# Patient Record
Sex: Male | Born: 1960 | Race: Black or African American | Hispanic: No | Marital: Married | Smoking: Never smoker
Health system: Southern US, Community
[De-identification: ages and names within clinical notes are randomized; demographics above are authoritative.]

## PROBLEM LIST (undated history)

## (undated) ENCOUNTER — Ambulatory Visit (HOSPITAL_COMMUNITY): Payer: BC Managed Care – PPO

## (undated) DIAGNOSIS — M1990 Unspecified osteoarthritis, unspecified site: Secondary | ICD-10-CM

## (undated) DIAGNOSIS — I10 Essential (primary) hypertension: Secondary | ICD-10-CM

## (undated) DIAGNOSIS — E785 Hyperlipidemia, unspecified: Secondary | ICD-10-CM

## (undated) DIAGNOSIS — M109 Gout, unspecified: Secondary | ICD-10-CM

## (undated) DIAGNOSIS — E119 Type 2 diabetes mellitus without complications: Secondary | ICD-10-CM

## (undated) HISTORY — DX: Essential (primary) hypertension: I10

## (undated) HISTORY — DX: Unspecified osteoarthritis, unspecified site: M19.90

---

## 2004-10-29 ENCOUNTER — Emergency Department (HOSPITAL_COMMUNITY): Admission: EM | Admit: 2004-10-29 | Discharge: 2004-10-29 | Payer: Self-pay | Admitting: *Deleted

## 2006-01-05 ENCOUNTER — Emergency Department (HOSPITAL_COMMUNITY): Admission: EM | Admit: 2006-01-05 | Discharge: 2006-01-05 | Payer: Self-pay | Admitting: Emergency Medicine

## 2006-10-16 ENCOUNTER — Emergency Department (HOSPITAL_COMMUNITY): Admission: EM | Admit: 2006-10-16 | Discharge: 2006-10-16 | Payer: Self-pay | Admitting: Emergency Medicine

## 2006-11-01 ENCOUNTER — Emergency Department (HOSPITAL_COMMUNITY): Admission: EM | Admit: 2006-11-01 | Discharge: 2006-11-01 | Payer: Self-pay | Admitting: Emergency Medicine

## 2012-03-18 ENCOUNTER — Ambulatory Visit (INDEPENDENT_AMBULATORY_CARE_PROVIDER_SITE_OTHER): Payer: BC Managed Care – PPO | Admitting: Family Medicine

## 2012-03-18 ENCOUNTER — Ambulatory Visit: Payer: BC Managed Care – PPO

## 2012-03-18 VITALS — BP 148/87 | HR 55 | Temp 98.2°F | Resp 20 | Ht 70.0 in | Wt 219.0 lb

## 2012-03-18 DIAGNOSIS — E782 Mixed hyperlipidemia: Secondary | ICD-10-CM

## 2012-03-18 DIAGNOSIS — R200 Anesthesia of skin: Secondary | ICD-10-CM

## 2012-03-18 DIAGNOSIS — I10 Essential (primary) hypertension: Secondary | ICD-10-CM

## 2012-03-18 DIAGNOSIS — Z0000 Encounter for general adult medical examination without abnormal findings: Secondary | ICD-10-CM

## 2012-03-18 DIAGNOSIS — N529 Male erectile dysfunction, unspecified: Secondary | ICD-10-CM

## 2012-03-18 DIAGNOSIS — R209 Unspecified disturbances of skin sensation: Secondary | ICD-10-CM

## 2012-03-18 DIAGNOSIS — Z23 Encounter for immunization: Secondary | ICD-10-CM

## 2012-03-18 DIAGNOSIS — E785 Hyperlipidemia, unspecified: Secondary | ICD-10-CM

## 2012-03-18 DIAGNOSIS — M109 Gout, unspecified: Secondary | ICD-10-CM

## 2012-03-18 LAB — URIC ACID: Uric Acid, Serum: 7.7 mg/dL (ref 4.0–7.8)

## 2012-03-18 LAB — COMPREHENSIVE METABOLIC PANEL
ALT: 21 U/L (ref 0–53)
Albumin: 4.7 g/dL (ref 3.5–5.2)
CO2: 27 mEq/L (ref 19–32)
Chloride: 103 mEq/L (ref 96–112)
Glucose, Bld: 98 mg/dL (ref 70–99)
Potassium: 4.5 mEq/L (ref 3.5–5.3)
Sodium: 139 mEq/L (ref 135–145)
Total Bilirubin: 0.7 mg/dL (ref 0.3–1.2)
Total Protein: 7.3 g/dL (ref 6.0–8.3)

## 2012-03-18 LAB — GLUCOSE, POCT (MANUAL RESULT ENTRY): POC Glucose: 94

## 2012-03-18 LAB — TESTOSTERONE: Testosterone: 343.37 ng/dL (ref 300–890)

## 2012-03-18 LAB — PSA: PSA: 2.49 ng/mL (ref <=4.00)

## 2012-03-18 LAB — LIPID PANEL
Cholesterol: 225 mg/dL — ABNORMAL HIGH (ref 0–200)
Total CHOL/HDL Ratio: 3.9 Ratio

## 2012-03-18 LAB — TSH: TSH: 1.666 u[IU]/mL (ref 0.350–4.500)

## 2012-03-18 MED ORDER — TELMISARTAN-HCTZ 80-12.5 MG PO TABS: 1.0000 | ORAL_TABLET | Freq: Every day | ORAL | Status: DC

## 2012-03-18 MED ORDER — SIMVASTATIN 80 MG PO TABS: 80.0000 mg | ORAL_TABLET | Freq: Every day | ORAL | Status: DC

## 2012-03-18 MED ORDER — TADALAFIL 10 MG PO TABS: 10.0000 mg | ORAL_TABLET | ORAL | Status: DC | PRN

## 2012-03-18 MED ORDER — ALLOPURINOL 100 MG PO TABS: 200.0000 mg | ORAL_TABLET | Freq: Every day | ORAL | Status: DC

## 2012-03-18 NOTE — Patient Instructions (Signed)
Check your blood pressures outside of office and recheck in the next 1 month.

## 2012-03-18 NOTE — Progress Notes (Signed)
  Subjective:    Patient ID: Guy Horton, male    DOB: [birth year], 51 y.o.   MRN: [id number]  HPI Guy Horton is a 51 y.o. male Here for CPE. Hx of  HTN, hyperlipidemia, and gout.  HTN - outside BP's - "normal" but unknown numbers. - possible 132/80? Marland Kitchen  No chest pain, no DOE, no lightheadedness or dizziness. No new side effects with meds. No recent missed doses.  Hyperlipidemia - On simvastatin 80mg  qd. No new myalgias.  Last lipid panel 8/12 - LDL 107  Gout - no new side effects of meds.  On 200mg  of allopurinol each day.  Last gout flair 1-2 years ago. Uric acid 9.2 on 08/06/11, but had rn out of meds a that time.  No missed doses recently.  Numbness - R big toe - x 3 weeks.  NKI.  No known hx DM.  No back, leg or ankle pain.  Occasional erectile dysfunction - past 3-4 months., tried Cialis in past - no side effects. Tried otc herbal treatments.   Fasting - last po 11pm last night.  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  Review of Systems See 13point ROS on scanned PHS form.    Objective:   Physical Exam  Constitutional: He is oriented to person, place, and time. He appears well-developed and well-nourished.  HENT:  Head: Normocephalic and atraumatic.  Right Ear: External ear normal.  Left Ear: External ear normal.  Mouth/Throat: Oropharynx is clear and moist.  Eyes: Conjunctivae and EOM are normal. Pupils are equal, round, and reactive to light.  Neck: Normal range of motion. Neck supple. No thyromegaly present.  Cardiovascular: Normal rate, regular rhythm, normal heart sounds and intact distal pulses.   Pulmonary/Chest: Effort normal and breath sounds normal. No respiratory distress. He has no wheezes.  Abdominal: Soft. He exhibits no distension. There is no tenderness. Hernia confirmed negative in the right inguinal area and confirmed negative in the left inguinal area.  Genitourinary: Prostate normal.  Musculoskeletal: Normal range of motion. He exhibits no edema  and no tenderness.  Lymphadenopathy:    He has no cervical adenopathy.  Neurological: He is alert and oriented to person, place, and time. He has normal reflexes.  Skin: Skin is warm and dry.  Psychiatric: He has a normal mood and affect. His behavior is normal.    Results for orders placed in visit on 03/18/12  GLUCOSE, POCT (MANUAL RESULT ENTRY)      Component Value Range   POC Glucose 94           Assessment & Plan:  Guy Horton is a 51 y.o. male CPE- Check PSA, cmp, lipid panel, tsh  And poct glucose as above.(parasthesias in foot), Tdap updated.  HTN - borderline.  Stop otc herbal supplements, keep record of bp's outside of office and recheck in 1 month.  Cont same meds for now, refilled x 6 months.  Gout -stable.  Cont allopurinol 200mg  qd, check uric acid.  Hyperlipidemia  - cont simvastatin 80mg  qd for now, but check CMP, lipids.   ED - check testosterone level, PSA, Rx Cialis 10mg , q 36hr prn - #3, SED, and ER CP precautions reviewed.  Parasthesias.- R great toe.  Possible shoe impingement vs peripheral neuropathy.  Check lytes and TSH, recheck in 1 months  Recheck above in next 1 month, sooner if worse.

## 2012-03-25 ENCOUNTER — Encounter: Payer: Self-pay | Admitting: *Deleted

## 2012-04-08 ENCOUNTER — Telehealth: Payer: Self-pay

## 2012-04-08 MED ORDER — ROSUVASTATIN CALCIUM 10 MG PO TABS: 10.0000 mg | ORAL_TABLET | Freq: Every day | ORAL | Status: DC

## 2012-04-08 NOTE — Telephone Encounter (Signed)
Pt CB after receiving unable to reach letter about labs. See notes under lab results for new Rx that I am sending in for pt written by Dr Neva Seat. Pt agrees to f/up in 1 month.

## 2012-06-06 ENCOUNTER — Telehealth: Payer: Self-pay

## 2012-06-06 NOTE — Telephone Encounter (Signed)
The patient would like Dr. Neva Seat to return call to him at [phone], to discuss issues regarding his visit on 03/18/12.

## 2012-06-07 NOTE — Telephone Encounter (Signed)
Spoke with patient-- wanted to discuss his medications with Dr. Neva Seat.  He states the Cialis doesn't seem to be working too well, and is too expensive--he would like to try a cheaper med if possible.  Also, he states the Crestor causes nausea for him at bedtime, even though he takes his dose in the am.  He would like to change this med to a different drug as well.  Pls advise.

## 2012-06-13 MED ORDER — SIMVASTATIN 80 MG PO TABS: 80.0000 mg | ORAL_TABLET | Freq: Every day | ORAL | Status: DC

## 2012-06-13 NOTE — Telephone Encounter (Signed)
Spoke w/pt and he agreed that he would like to change back to simvastatin 80 QD. Sent in Rx for this to pharmacy. Pt agreed to call his pharmacy and check on prices of Viagra and Levitra and call us back to Rx if they are cheaper.

## 2012-06-13 NOTE — Telephone Encounter (Signed)
Pt CB and stated that pharmacy told him that they can't just tell him how much each would cost him. We have to actually send in a Rx first. Pt requests that we send in Rx for Levitra to try and he will see if he can afford it.

## 2012-06-13 NOTE — Telephone Encounter (Signed)
Call pt - he can change back to the simvastatin 80mg  qd if he tolerated that better, but should follow up to discuss in more detail if not improved.  Due for repeat OV based on last visit.  He can check with his pharmacist to see if Viagra or Levitra is less expensive.  If these are less expensive, I can prescribe those instead.

## 2012-06-16 ENCOUNTER — Other Ambulatory Visit: Payer: Self-pay | Admitting: Family Medicine

## 2012-06-16 MED ORDER — VARDENAFIL HCL 10 MG PO TABS: ORAL_TABLET | ORAL | Status: DC

## 2012-06-16 NOTE — Telephone Encounter (Signed)
I sent in rx for Levitra

## 2012-06-16 NOTE — Telephone Encounter (Signed)
LMOM notifying patient rx sent in. 

## 2012-08-04 ENCOUNTER — Telehealth: Payer: Self-pay

## 2012-08-04 NOTE — Telephone Encounter (Signed)
Pt came into 102 to ask for a RF on Cialis 5 mg #30. He has a voucher for a free RF, but he need to Rx. Pt was due for recheck per OV notes on 03/18/12, but is unable to stay today. He states he has talked w/Dr Neva Seat since his OV and gave him BP readings and that it was fine. He is really not sure that he needs a f/up visit now. Advised pt that I will check w/Dr Neva Seat to see if he can RF w/o OV.

## 2012-08-04 NOTE — Telephone Encounter (Signed)
He is due for an office visit to recheck cholesterol, electrolytes, and blood pressure. We can call in # 30 of Cialis 5mg   1 po every 2 days as needed, no further refills if he schedules appointment to see me in next few weeks or can be seen at 102 if needed in that time period.

## 2012-08-05 ENCOUNTER — Other Ambulatory Visit: Payer: Self-pay

## 2012-08-05 MED ORDER — TADALAFIL 10 MG PO TABS: 10.0000 mg | ORAL_TABLET | ORAL | Status: DC | PRN

## 2012-08-05 NOTE — Telephone Encounter (Signed)
Notified pt that RF has been sent to pharm and gave him Dr Paralee Cancel message about f/up needed. Pt agreed.

## 2012-08-13 ENCOUNTER — Ambulatory Visit (INDEPENDENT_AMBULATORY_CARE_PROVIDER_SITE_OTHER): Payer: BC Managed Care – PPO | Admitting: Physician Assistant

## 2012-08-13 VITALS — BP 133/95 | HR 82 | Temp 98.0°F | Resp 18 | Ht 70.0 in | Wt 225.0 lb

## 2012-08-13 DIAGNOSIS — M109 Gout, unspecified: Secondary | ICD-10-CM | POA: Insufficient documentation

## 2012-08-13 DIAGNOSIS — N529 Male erectile dysfunction, unspecified: Secondary | ICD-10-CM | POA: Insufficient documentation

## 2012-08-13 DIAGNOSIS — M25561 Pain in right knee: Secondary | ICD-10-CM

## 2012-08-13 DIAGNOSIS — E7800 Pure hypercholesterolemia, unspecified: Secondary | ICD-10-CM | POA: Insufficient documentation

## 2012-08-13 DIAGNOSIS — M25569 Pain in unspecified knee: Secondary | ICD-10-CM

## 2012-08-13 DIAGNOSIS — I10 Essential (primary) hypertension: Secondary | ICD-10-CM | POA: Insufficient documentation

## 2012-08-13 MED ORDER — INDOMETHACIN 50 MG PO CAPS: 50.0000 mg | ORAL_CAPSULE | Freq: Three times a day (TID) | ORAL | Status: AC | PRN

## 2012-08-13 MED ORDER — HYDROCODONE-ACETAMINOPHEN 5-325 MG PO TABS: 1.0000 | ORAL_TABLET | Freq: Every evening | ORAL | Status: AC | PRN

## 2012-08-13 NOTE — Progress Notes (Signed)
  Subjective:    Patient ID: Guy Horton, male    DOB: 18-Jun-1961, 51 y.o.   MRN: [id number]  HPI  Pt presents to clinic with R knee pain for the last 3 days.  He has h/o gout and is on daily allopurinol. He ate shrimp and drank a beer and knows that is what started his gout flair.  He is having trouble walking due to the pain.  Feels just like all his other flairs, R medial knee.    Review of Systems  Musculoskeletal: Positive for joint swelling and gait problem.       Objective:   Physical Exam  Vitals reviewed. Constitutional: He is oriented to person, place, and time. He appears well-developed and well-nourished.  HENT:  Head: Normocephalic and atraumatic.  Right Ear: External ear normal.  Left Ear: External ear normal.  Pulmonary/Chest: Effort normal.  Musculoskeletal:       Right knee: He exhibits swelling and erythema.       R knee swollen, warm and red.  He has TTP on the R medial knee only, tenderness with light touch.  Neurological: He is alert and oriented to person, place, and time.  Skin: Skin is warm and dry.  Psychiatric: He has a normal mood and affect. His behavior is normal. Judgment and thought content normal.        Assessment & Plan:   1. Gout  indomethacin (INDOCIN) 50 MG capsule, HYDROcodone-acetaminophen (NORCO/VICODIN) 5-325 MG per tablet  2. Knee pain, right  indomethacin (INDOCIN) 50 MG capsule, HYDROcodone-acetaminophen (NORCO/VICODIN) 5-325 MG per tablet   D/w pt impotence medications - if he finds a coupon let us know so we can send in meds for him.  He plans on coming in within the next week or so to see Dr Neva Seat for his regular medical care.

## 2012-08-21 ENCOUNTER — Telehealth: Payer: Self-pay

## 2012-08-21 NOTE — Telephone Encounter (Signed)
At his visit 08/13/2012, he indicated we was going to come back in the following week for routine health maintenance.  I advise he go ahead and do that and he can discuss with the provider then which medication would be the best next choice for him.  Another option is to call his pharmacy benefit manager (on the back of his insurance card) and find out which product is preferred on the formulary.

## 2012-08-21 NOTE — Telephone Encounter (Signed)
tadalafil (CIALIS) 10 MG tablet  30 days 2.5 mg Too expensive  [facility]  [phone]

## 2012-08-21 NOTE — Telephone Encounter (Signed)
Patient indicates Cialis too expensive please advise more cost effective alternative.

## 2012-08-22 NOTE — Telephone Encounter (Signed)
I have called patient to advise ( he is angry) and does not want his physical, he states he is not having any other problems. I explained to him medical issues can cause erectile dysfunction and we can discuss these with him when he comes in for his physical, since he is due for this now. He states he is not having any medical issues and does not need a physical. He only wants another Rx for ED. I explained again we can not do this without seeing him. He sates he works two jobs and can not come in. He wants me to send this message to Dr Neva Seat so Dr Neva Seat will call him. I have advised him I will advise Dr Neva Seat of the situation.

## 2012-08-23 NOTE — Telephone Encounter (Signed)
i have reviewed the recent telephone notes and office visits. He is under treatment for chronic medical problems other than erectile dysfunction, including gout, hypertension and hyperlipidemia.   I last saw him in April and based on his elevated cholesterol, a change in statin was performed with plan to follow up in 1 month.to determine control and tolerance of new statin, which has not happened.  I sent in a prescription for Levitra in June, and then Cialis - #30 of 5 mg in late August with understanding that a follow up appt would be obtained.  I do see where he had an acute office visit recently, and plan for him to call us when he found a voucher or knew which ED medicine he wanted prescribed. If the Cialis is too expensive - did he check with his pharmacy benefits to deternine which medicine would be more cost effective, as was recommended?  Does he have an appointment scheduled with me? As stated before - he is overdue to follow up on the cholesterol medicine, and will be due for follow up on blood pressure in next month. I agree with prior messages - he needs to return for follow up.  i would be willing to prescribe # 6 of Levitra 10mg  wih NO refills if that would be more cost effective, but will not prescibe any further ED meds without an office visit.  If scheduling is a problem with his jobs, he can walk in to be seen when I am at 102, including many evening or weekend options.

## 2012-08-24 ENCOUNTER — Other Ambulatory Visit: Payer: Self-pay | Admitting: Family Medicine

## 2012-08-24 MED ORDER — VARDENAFIL HCL 10 MG PO TABS: 5.0000 mg | ORAL_TABLET | ORAL | Status: DC | PRN

## 2012-08-24 NOTE — Telephone Encounter (Signed)
Spoke with pt and explained the message from Dr Neva Seat. He would like a 30 day supply ED meds which is Cialis but he has a coupon for Levitra.

## 2012-08-24 NOTE — Telephone Encounter (Signed)
Patient had called earlier to ask if rx prescription could be called in at his pharmacy and his pharmacy said it was not received today. Please call pharmacy whenever possible at [phone]

## 2012-08-24 NOTE — Telephone Encounter (Signed)
Pt called back, advised RX sent to pharmacy. Pt understood and he will come in for an ov

## 2012-08-24 NOTE — Telephone Encounter (Signed)
Done. No more refills without ov.

## 2012-08-24 NOTE — Telephone Encounter (Signed)
Spoke with pt I explained the every day ED pill is Cialis, so he would like to try levitra because he has a coupon. Could he have 9 pills or the max he can get a month? The coupon takes a percentage off so the more the better he can get off. Please advise

## 2012-08-24 NOTE — Telephone Encounter (Signed)
LMOM to CB. 

## 2012-11-05 ENCOUNTER — Other Ambulatory Visit: Payer: Self-pay | Admitting: Family Medicine

## 2013-02-06 ENCOUNTER — Other Ambulatory Visit: Payer: Self-pay | Admitting: Family Medicine

## 2013-02-06 ENCOUNTER — Telehealth: Payer: Self-pay

## 2013-02-06 NOTE — Telephone Encounter (Signed)
PT STATES HE RECEIVED A CALL STATING THEY WERE HAVING TROUBLE GETTING HIS MEDICINE FROM THE PHARMACY. WOULD LIKE TO SPEAK WITH SOMEONE ABOUT IT PLEASE CALL [phone]     [facility] [address]

## 2013-02-06 NOTE — Telephone Encounter (Signed)
Pt has questions concerning his medication.   CBN:  [phone]

## 2013-02-09 NOTE — Telephone Encounter (Signed)
Left message for him to call me back.  

## 2013-02-10 ENCOUNTER — Telehealth: Payer: Self-pay

## 2013-02-10 NOTE — Telephone Encounter (Signed)
Patient states his Micardis not covered by his insurance any longer. Please advise on alternative.

## 2013-02-10 NOTE — Telephone Encounter (Signed)
Called phone number and ID # on prior auth form sent by pharmacy for Micardis HCT and was told that pt's policy is not active. Checked w/pharmacy and they verified this is the only info they have for pt. Contacted pt and he gave me the ID # and cust service # on his ins card, which was different than what pharmacy had, and was advised that pt is not in their system either. 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 Advised him to have pharmacy try to process Rx w/current info and they can send me an updated prior auth request w/correct info if it is needed. Pt agreed.

## 2013-02-10 NOTE — Telephone Encounter (Signed)
PT HAVE QUESTIONS REGARDING HIS MEDICINE. PLEASE CALL [phone]

## 2013-02-10 NOTE — Telephone Encounter (Signed)
Do we know what will be covered by ins?  There are other meds in the same class we could try

## 2013-02-11 NOTE — Telephone Encounter (Signed)
[facility]ed [facility]. To see what else may be covered, has been sent for prior Auth. [facility] does not know what else may be covered. This medication no longer being manufactured. Apparently there is a generic now, and he did get this filled this week.

## 2013-04-11 ENCOUNTER — Other Ambulatory Visit: Payer: Self-pay | Admitting: Family Medicine

## 2013-05-02 ENCOUNTER — Other Ambulatory Visit: Payer: Self-pay | Admitting: Family Medicine

## 2013-05-14 ENCOUNTER — Ambulatory Visit (INDEPENDENT_AMBULATORY_CARE_PROVIDER_SITE_OTHER): Payer: BC Managed Care – PPO | Admitting: Emergency Medicine

## 2013-05-14 VITALS — BP 178/112 | HR 63 | Temp 98.0°F | Resp 16 | Ht 70.0 in | Wt 231.8 lb

## 2013-05-14 DIAGNOSIS — I10 Essential (primary) hypertension: Secondary | ICD-10-CM

## 2013-05-14 DIAGNOSIS — E782 Mixed hyperlipidemia: Secondary | ICD-10-CM

## 2013-05-14 DIAGNOSIS — E7800 Pure hypercholesterolemia, unspecified: Secondary | ICD-10-CM

## 2013-05-14 LAB — POCT CBC
Granulocyte percent: 63.1 %G (ref 37–80)
HCT, POC: 46 % (ref 43.5–53.7)
MCH, POC: 31.3 pg — AB (ref 27–31.2)
MCHC: 31.7 g/dL — AB (ref 31.8–35.4)
MID (cbc): 0.6 (ref 0–0.9)
RDW, POC: 13 %
WBC: 7 10*3/uL (ref 4.6–10.2)

## 2013-05-14 LAB — LIPID PANEL
HDL: 43 mg/dL (ref >39)
LDL Cholesterol: 128 mg/dL — ABNORMAL HIGH (ref 0–99)
Total CHOL/HDL Ratio: 4.6 Ratio
Triglycerides: 136 mg/dL (ref <150)
VLDL: 27 mg/dL (ref 0–40)

## 2013-05-14 LAB — COMPREHENSIVE METABOLIC PANEL
Alkaline Phosphatase: 49 U/L (ref 39–117)
BUN: 16 mg/dL (ref 6–23)
Creat: 1.04 mg/dL (ref 0.50–1.35)
Glucose, Bld: 105 mg/dL — ABNORMAL HIGH (ref 70–99)
Sodium: 140 mEq/L (ref 135–145)
Total Bilirubin: 0.5 mg/dL (ref 0.3–1.2)
Total Protein: 7.2 g/dL (ref 6.0–8.3)

## 2013-05-14 MED ORDER — ROSUVASTATIN CALCIUM 20 MG PO TABS: 20.0000 mg | ORAL_TABLET | Freq: Every day | ORAL | Status: DC

## 2013-05-14 MED ORDER — VARDENAFIL HCL 20 MG PO TABS: 20.0000 mg | ORAL_TABLET | ORAL | Status: DC | PRN

## 2013-05-14 MED ORDER — TELMISARTAN-HCTZ 80-12.5 MG PO TABS: 1.0000 | ORAL_TABLET | Freq: Every day | ORAL | Status: DC

## 2013-05-14 NOTE — Progress Notes (Signed)
[facility] [address] [place] [address] [phone]  Date:  05/14/2013   Name:  Guy Horton   DOB:  02-Aug-1961   MRN:  [id number]  PCP:  Abbe Amsterdam, MD    Chief Complaint: Medication Refill   History of Present Illness:  Guy Horton is a 52 y.o. very pleasant male patient who presents with the following:  History of hypertension and hyperlipidemia.  Out of medication for three months.  Not having symptoms.  No edema, chest pain, shortness of breath or wheezing.  No cough.  No improvement with over the counter medications or other home remedies. Denies other complaint or health concern today.   Patient Active Problem List   Diagnosis Date Noted  . Gout 08/13/2012  . HTN (hypertension) 08/13/2012  . Erectile dysfunction 08/13/2012  . Hypercholesteremia 08/13/2012    No past medical history on file.  No past surgical history on file.  History  Substance Use Topics  . Smoking status: Never Smoker   . Smokeless tobacco: Not on file  . Alcohol Use: Not on file    No family history on file.  No Known Allergies  Medication list has been reviewed and updated.  Current Outpatient Prescriptions on File Prior to Visit  Medication Sig Dispense Refill  . allopurinol (ZYLOPRIM) 100 MG tablet TAKE 2 TABLETS EVERY DAY FOR GOUT  60 tablet  3  . simvastatin (ZOCOR) 80 MG tablet Take 1 tablet (80 mg total) by mouth at bedtime. Needs office visit  30 tablet  0  . tadalafil (CIALIS) 10 MG tablet Take 1 tablet (10 mg total) by mouth every other day as needed for erectile dysfunction.  30 tablet  0  . telmisartan-hydrochlorothiazide (MICARDIS HCT) 80-12.5 MG per tablet Take 1 tablet by mouth daily. Needs office visit  30 tablet  0   No current facility-administered medications on file prior to visit.    Review of Systems:  As per HPI, otherwise negative.    Physical Examination: Filed Vitals:   05/14/13 0954  BP: 178/112  Pulse: 63  Temp:  98 F (36.7 C)  Resp: 16   Filed Vitals:   05/14/13 0954  Height: 5\' 10"  (1.778 m)  Weight: 231 lb 12.8 oz (105.144 kg)   Body mass index is 33.26 kg/(m^2). Ideal Body Weight: Weight in (lb) to have BMI = 25: 173.9  GEN: WDWN, NAD, Non-toxic, A & O x 3 HEENT: Atraumatic, Normocephalic. Neck supple. No masses, No LAD. Ears and Nose: No external deformity. CV: RRR, No M/G/R. No JVD. No thrill. No extra heart sounds. PULM: CTA B, no wheezes, crackles, rhonchi. No retractions. No resp. distress. No accessory muscle use. ABD: S, NT, ND, +BS. No rebound. No HSM. EXTR: No c/c/e NEURO Normal gait.  PSYCH: Normally interactive. Conversant. Not depressed or anxious appearing.  Calm demeanor.    Assessment and Plan: Hypertension  Hyperlipidemia Refill meds and follow up in one-three months   Signed,  Phillips Odor, MD

## 2013-05-15 ENCOUNTER — Encounter: Payer: Self-pay | Admitting: *Deleted

## 2013-08-29 ENCOUNTER — Other Ambulatory Visit: Payer: Self-pay | Admitting: Physician Assistant

## 2014-01-12 ENCOUNTER — Other Ambulatory Visit: Payer: Self-pay | Admitting: Physician Assistant

## 2014-01-13 NOTE — Telephone Encounter (Signed)
Pt needs ov for further refills.  ?

## 2014-01-22 ENCOUNTER — Other Ambulatory Visit: Payer: Self-pay | Admitting: Emergency Medicine

## 2014-03-27 ENCOUNTER — Other Ambulatory Visit: Payer: Self-pay | Admitting: Physician Assistant

## 2014-11-20 ENCOUNTER — Ambulatory Visit (INDEPENDENT_AMBULATORY_CARE_PROVIDER_SITE_OTHER): Payer: BC Managed Care – PPO | Admitting: Family Medicine

## 2014-11-20 VITALS — BP 188/112 | HR 78 | Temp 98.5°F | Resp 16 | Ht 70.0 in | Wt 221.0 lb

## 2014-11-20 DIAGNOSIS — Z9114 Patient's other noncompliance with medication regimen: Secondary | ICD-10-CM | POA: Insufficient documentation

## 2014-11-20 DIAGNOSIS — I10 Essential (primary) hypertension: Secondary | ICD-10-CM

## 2014-11-20 MED ORDER — TELMISARTAN-HCTZ 80-12.5 MG PO TABS: 1.0000 | ORAL_TABLET | Freq: Every day | ORAL | Status: DC

## 2014-11-20 NOTE — Progress Notes (Signed)
[facility] [address], [place] [address] [phone]  Date:  11/20/2014   Name:  Guy Horton   DOB:  28-Dec-1960   MRN:  [id number]  PCP:  Lamar Blinks, MD    Chief Complaint: Medication Refill   History of Present Illness:  Guy Horton is a 53 y.o. very pleasant male patient who presents with the following:  He has been off of his BP medication for about 45 days.  He was last here about 18 months ago.  When he was on his medication he thinks his BP was about 150/97.   He was on micardis HCT.  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   He did have a HA one day, but OW has not noted any sx of HTN.  This responded to a [employer] powder.  OW he is feeling well and not having any sx in particular  BP Readings from Last 3 Encounters:  11/20/14 188/112  05/14/13 178/112  08/13/12 133/95     Patient Active Problem List   Diagnosis Date Noted  . Gout 08/13/2012  . HTN (hypertension) 08/13/2012  . Erectile dysfunction 08/13/2012  . Hypercholesteremia 08/13/2012    Past Medical History  Diagnosis Date  . Hypertension     No past surgical history on file.  History  Substance Use Topics  . Smoking status: Never Smoker   . Smokeless tobacco: Not on file  . Alcohol Use: Not on file    Family History  Problem Relation Age of Onset  . Diabetes Mother     No Known Allergies  Medication list has been reviewed and updated.  Current Outpatient Prescriptions on File Prior to Visit  Medication Sig Dispense Refill  . allopurinol (ZYLOPRIM) 100 MG tablet Take 2 tablets BID NEED VISIT/LABS 60 tablet 0  . rosuvastatin (CRESTOR) 20 MG tablet Take 1 tablet (20 mg total) by mouth daily. 30 tablet 3  . simvastatin (ZOCOR) 80 MG tablet One tablet daily NEED VISIT/LABS 30 tablet 0  . telmisartan-hydrochlorothiazide (MICARDIS HCT) 80-12.5 MG per tablet Take 1 tablet by mouth daily. Needs office visit 30 tablet 0  . telmisartan-hydrochlorothiazide  (MICARDIS HCT) 80-12.5 MG per tablet Take 1 tablet by mouth daily. PATIENT NEEDS OFFICE VISIT FOR ADDITIONAL REFILLS 30 tablet 0  . vardenafil (LEVITRA) 20 MG tablet Take 1 tablet (20 mg total) by mouth as needed for erectile dysfunction. 12 tablet 12  . tadalafil (CIALIS) 10 MG tablet Take 1 tablet (10 mg total) by mouth every other day as needed for erectile dysfunction. 30 tablet 0   No current facility-administered medications on file prior to visit.    Review of Systems:  As per HPI- otherwise negative.   Physical Examination: Filed Vitals:   11/20/14 1551  BP: 188/112  Pulse: 78  Temp: 98.5 F (36.9 C)  Resp: 16   Filed Vitals:   11/20/14 1551  Height: 5\' 10"  (1.778 m)  Weight: 221 lb (100.245 kg)   Body mass index is 31.71 kg/(m^2). Ideal Body Weight: Weight in (lb) to have BMI = 25: 173.9  GEN: WDWN, NAD, Non-toxic, A & O x 3 HEENT: Atraumatic, Normocephalic. Neck supple. No masses, No LAD. Ears and Nose: No external deformity. CV: RRR, No M/G/R. No JVD. No thrill. No extra heart sounds. PULM: CTA B, no wheezes, crackles, rhonchi. No retractions. No resp. distress. No accessory muscle use.Marland Kitchen EXTR: No c/c/e NEURO Normal gait.  PSYCH: Normally interactive. Conversant.  Not depressed or anxious appearing.  Calm demeanor.    Assessment and Plan: Essential hypertension - Plan: telmisartan-hydrochlorothiazide (MICARDIS HCT) 80-12.5 MG per tablet, Basic metabolic panel  Very uncontrolled HTN.   Reminded him that he needs to do a  Better job taking care of his BP and keeping on his medications regularly.   Will follow- up with him pending his labs  Signed Lamar Blinks, MD

## 2014-11-20 NOTE — Patient Instructions (Signed)
Your blood pressure is way out of control.  Take a 1/2 tablet of your BP medication for the next 2 days and then go to a whole tablet I will be in touch with your labs. You need to stay on medication consistently in order the control your blood pressure and decrease your risk of heart attack or stroke.

## 2014-11-21 LAB — BASIC METABOLIC PANEL
BUN: 19 mg/dL (ref 6–23)
CHLORIDE: 104 meq/L (ref 96–112)
CO2: 24 meq/L (ref 19–32)
Calcium: 9.6 mg/dL (ref 8.4–10.5)
Creat: 1.1 mg/dL (ref 0.50–1.35)
Glucose, Bld: 89 mg/dL (ref 70–99)
POTASSIUM: 4.3 meq/L (ref 3.5–5.3)
SODIUM: 137 meq/L (ref 135–145)

## 2014-11-22 ENCOUNTER — Encounter: Payer: Self-pay | Admitting: Family Medicine

## 2015-01-17 ENCOUNTER — Telehealth: Payer: Self-pay

## 2015-01-17 MED ORDER — SIMVASTATIN 80 MG PO TABS: ORAL_TABLET | ORAL | Status: DC

## 2015-01-17 MED ORDER — ALLOPURINOL 100 MG PO TABS: ORAL_TABLET | ORAL | Status: DC

## 2015-01-17 NOTE — Telephone Encounter (Signed)
Please advise patient that he needs re-evaluation prior to running out of this 30-day supply.  Meds ordered this encounter  Medications  . allopurinol (ZYLOPRIM) 100 MG tablet    Sig: Take 2 tablets BID    Dispense:  120 tablet    Refill:  0  . simvastatin (ZOCOR) 80 MG tablet    Sig: One tablet daily    Dispense:  30 tablet    Refill:  0

## 2015-01-17 NOTE — Telephone Encounter (Signed)
Can we refill? 

## 2015-01-17 NOTE — Telephone Encounter (Signed)
Patient was seen on 11/20/14  For Med refills.   He needs allopurinol (ZYLOPRIM) 100 MG tablet And simvastatin (ZOCOR) 80 MG tablet   [facility] [address]   [phone]

## 2015-01-18 NOTE — Telephone Encounter (Signed)
Faxed and notified pt of RFs and need to f/up w/in next 30 days. Pt agreed.

## 2015-02-17 ENCOUNTER — Encounter (HOSPITAL_COMMUNITY): Payer: Self-pay | Admitting: Emergency Medicine

## 2015-02-17 ENCOUNTER — Emergency Department (HOSPITAL_COMMUNITY): Payer: Self-pay

## 2015-02-17 ENCOUNTER — Emergency Department (HOSPITAL_COMMUNITY)
Admission: EM | Admit: 2015-02-17 | Discharge: 2015-02-17 | Disposition: A | Discharge status: Home / Self Care | Payer: Worker's Compensation | Attending: Emergency Medicine | Admitting: Emergency Medicine

## 2015-02-17 ENCOUNTER — Telehealth: Payer: Self-pay

## 2015-02-17 DIAGNOSIS — Y9389 Activity, other specified: Secondary | ICD-10-CM | POA: Insufficient documentation

## 2015-02-17 DIAGNOSIS — S99922A Unspecified injury of left foot, initial encounter: Secondary | ICD-10-CM | POA: Insufficient documentation

## 2015-02-17 DIAGNOSIS — I10 Essential (primary) hypertension: Secondary | ICD-10-CM | POA: Insufficient documentation

## 2015-02-17 DIAGNOSIS — Y990 Civilian activity done for income or pay: Secondary | ICD-10-CM | POA: Insufficient documentation

## 2015-02-17 DIAGNOSIS — Y9289 Other specified places as the place of occurrence of the external cause: Secondary | ICD-10-CM | POA: Insufficient documentation

## 2015-02-17 DIAGNOSIS — Z79899 Other long term (current) drug therapy: Secondary | ICD-10-CM | POA: Insufficient documentation

## 2015-02-17 DIAGNOSIS — M79672 Pain in left foot: Secondary | ICD-10-CM

## 2015-02-17 DIAGNOSIS — X58XXXA Exposure to other specified factors, initial encounter: Secondary | ICD-10-CM | POA: Insufficient documentation

## 2015-02-17 MED ORDER — NAPROXEN 250 MG PO TABS: 250.0000 mg | ORAL_TABLET | Freq: Two times a day (BID) | ORAL | Status: DC

## 2015-02-17 NOTE — ED Notes (Signed)
Pt states that he injured his lt foot at work on Monday.  States a cart rolled over his foot.

## 2015-02-17 NOTE — Discharge Instructions (Signed)
Your x-ray of your left foot showed old fractures, but no new fractures.   Foot Sprain The muscles and cord like structures which attach muscle to bone (tendons) that surround the feet are made up of units. A foot sprain can occur at the weakest spot in any of these units. This condition is most often caused by injury to or overuse of the foot, as from playing contact sports, or aggravating a previous injury, or from poor conditioning, or obesity. SYMPTOMS  Pain with movement of the foot.  Tenderness and swelling at the injury site.  Loss of strength is present in moderate or severe sprains. THE THREE GRADES OR SEVERITY OF FOOT SPRAIN ARE:  Mild (Grade I): Slightly pulled muscle without tearing of muscle or tendon fibers or loss of strength.  Moderate (Grade II): Tearing of fibers in a muscle, tendon, or at the attachment to bone, with small decrease in strength.  Severe (Grade III): Rupture of the muscle-tendon-bone attachment, with separation of fibers. Severe sprain requires surgical repair. Often repeating (chronic) sprains are caused by overuse. Sudden (acute) sprains are caused by direct injury or over-use. DIAGNOSIS  Diagnosis of this condition is usually by your own observation. If problems continue, a caregiver may be required for further evaluation and treatment. X-rays may be required to make sure there are not breaks in the bones (fractures) present. Continued problems may require physical therapy for treatment. PREVENTION  Use strength and conditioning exercises appropriate for your sport.  Warm up properly prior to working out.  Use athletic shoes that are made for the sport you are participating in.  Allow adequate time for healing. Early return to activities makes repeat injury more likely, and can lead to an unstable arthritic foot that can result in prolonged disability. Mild sprains generally heal in 3 to 10 days, with moderate and severe sprains taking 2 to 10 weeks.  Your caregiver can help you determine the proper time required for healing. HOME CARE INSTRUCTIONS   Apply ice to the injury for 15-20 minutes, 03-04 times per day. Put the ice in a plastic bag and place a towel between the bag of ice and your skin.  An elastic wrap (like an Ace bandage) may be used to keep swelling down.  Keep foot above the level of the heart, or at least raised on a footstool, when swelling and pain are present.  Try to avoid use other than gentle range of motion while the foot is painful. Do not resume use until instructed by your caregiver. Then begin use gradually, not increasing use to the point of pain. If pain does develop, decrease use and continue the above measures, gradually increasing activities that do not cause discomfort, until you gradually achieve normal use.  Use crutches if and as instructed, and for the length of time instructed.  Keep injured foot and ankle wrapped between treatments.  Massage foot and ankle for comfort and to keep swelling down. Massage from the toes up towards the knee.  Only take over-the-counter or prescription medicines for pain, discomfort, or fever as directed by your caregiver. SEEK IMMEDIATE MEDICAL CARE IF:   Your pain and swelling increase, or pain is not controlled with medications.  You have loss of feeling in your foot or your foot turns cold or blue.  You develop new, unexplained symptoms, or an increase of the symptoms that brought you to your caregiver. MAKE SURE YOU:   Understand these instructions.  Will watch your condition.  Will  get help right away if you are not doing well or get worse. Document Released: 05/18/2002 Document Revised: 02/18/2012 Document Reviewed: 07/15/2008 [facility] Patient Information 2015 [place], [place]. This information is not intended to replace advice given to you by your health care provider. Make sure you discuss any questions you have with your health care provider.

## 2015-02-17 NOTE — Telephone Encounter (Signed)
Please advise, last uric acid was in 2013.
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
## 2015-02-17 NOTE — ED Notes (Signed)
Patient transported to X-ray 

## 2015-02-17 NOTE — Telephone Encounter (Signed)
Patient had a gout flare up and needs something called in. He says he cannot wait 24-48 hours, I advised him to come in if he feels he cannot wait. He refused. Advised we will do the best we can

## 2015-02-18 NOTE — Telephone Encounter (Signed)
Hi Guy Horton,  Looks like he went to the ED yest afternoon for the foot issue. They worked him up, started an NSAID, and referred to ortho. Looks like there is nothing further to do from our end for the foot.

## 2015-02-18 NOTE — Telephone Encounter (Signed)
Spoke with pt, advised message from [place]. Pt will come in to be seen by a provider to follow up on meds.

## 2015-08-12 ENCOUNTER — Ambulatory Visit (INDEPENDENT_AMBULATORY_CARE_PROVIDER_SITE_OTHER): Payer: Worker's Compensation | Admitting: Emergency Medicine

## 2015-08-12 VITALS — BP 132/90 | HR 102 | Temp 98.7°F | Resp 16 | Ht 71.0 in

## 2015-08-12 DIAGNOSIS — I10 Essential (primary) hypertension: Secondary | ICD-10-CM

## 2015-08-12 DIAGNOSIS — S81811A Laceration without foreign body, right lower leg, initial encounter: Secondary | ICD-10-CM | POA: Diagnosis not present

## 2015-08-12 DIAGNOSIS — S7011XA Contusion of right thigh, initial encounter: Secondary | ICD-10-CM

## 2015-08-12 LAB — POCT CBC
GRANULOCYTE PERCENT: 78.2 % (ref 37–80)
HCT, POC: 34.2 % — AB (ref 43.5–53.7)
Hemoglobin: 10.8 g/dL — AB (ref 14.1–18.1)
LYMPH, POC: 1.7 (ref 0.6–3.4)
MCH, POC: 29.2 pg (ref 27–31.2)
MCHC: 31.6 g/dL — AB (ref 31.8–35.4)
MCV: 92.5 fL (ref 80–97)
MID (cbc): 0.7 (ref 0–0.9)
MPV: 6.2 fL (ref 0–99.8)
PLATELET COUNT, POC: 535 10*3/uL — AB (ref 142–424)
POC Granulocyte: 8.4 — AB (ref 2–6.9)
POC LYMPH %: 15.7 % (ref 10–50)
POC MID %: 6.1 %M (ref 0–12)
RBC: 3.7 M/uL — AB (ref 4.69–6.13)
RDW, POC: 12.8 %
WBC: 10.7 10*3/uL — AB (ref 4.6–10.2)

## 2015-08-12 MED ORDER — OXYCODONE-ACETAMINOPHEN 5-325 MG PO TABS: 1.0000 | ORAL_TABLET | ORAL | Status: DC | PRN

## 2015-08-12 MED ORDER — METHOCARBAMOL 750 MG PO TABS: 750.0000 mg | ORAL_TABLET | Freq: Three times a day (TID) | ORAL | Status: DC

## 2015-08-12 MED ORDER — MORPHINE SULFATE ER 15 MG PO TBCR: 15.0000 mg | EXTENDED_RELEASE_TABLET | Freq: Two times a day (BID) | ORAL | Status: DC

## 2015-08-12 NOTE — Patient Instructions (Signed)
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
## 2015-08-13 LAB — COMPREHENSIVE METABOLIC PANEL
ALBUMIN: 3.7 g/dL (ref 3.6–5.1)
ALK PHOS: 108 U/L (ref 40–115)
ALT: 165 U/L — AB (ref 9–46)
AST: 191 U/L — AB (ref 10–35)
BILIRUBIN TOTAL: 1.9 mg/dL — AB (ref 0.2–1.2)
BUN: 15 mg/dL (ref 7–25)
CALCIUM: 9 mg/dL (ref 8.6–10.3)
CO2: 22 mmol/L (ref 20–31)
CREATININE: 1.26 mg/dL (ref 0.70–1.33)
Chloride: 101 mmol/L (ref 98–110)
GLUCOSE: 118 mg/dL — AB (ref 65–99)
Potassium: 4.3 mmol/L (ref 3.5–5.3)
Sodium: 134 mmol/L — ABNORMAL LOW (ref 135–146)
Total Protein: 7.1 g/dL (ref 6.1–8.1)

## 2015-08-21 ENCOUNTER — Other Ambulatory Visit: Payer: Self-pay | Admitting: Physician Assistant

## 2015-08-22 ENCOUNTER — Telehealth: Payer: Self-pay

## 2015-08-22 NOTE — Telephone Encounter (Signed)
Pt needs med refills on allopurinol (ZYLOPRIM) and simvastatin (ZOCOR) because he is having a gout flare up.  Please advise [phone] or [phone]

## 2015-08-24 NOTE — Telephone Encounter (Signed)
Pt also need his muscle relaxer , and robaxin and his hydorcodone

## 2015-08-25 NOTE — Telephone Encounter (Signed)
Please advise. Pt had allopurinol and simvastatin sent in 9/13 by Windell Hummingbird.  I called patient to get clarification for refills. Now requesting refill of robaxin and oxycodone.

## 2015-08-25 NOTE — Telephone Encounter (Signed)
Spoke with Dr. Ouida Sills since he saw pt and rx'd the oxy and robaxin. Dr. Ouida Sills states he would consider refilling these medications but he needs a follow up visit first. Tell pt he needs to return to follow with DR. ANDERSON. Ouida Sills is here tonight 9/15 and then again Saturday, 9/17.

## 2015-08-25 NOTE — Telephone Encounter (Signed)
Thank you, Elmyra Ricks. Pt will come see Dr. Ouida Sills on Saturday.

## 2015-08-27 ENCOUNTER — Ambulatory Visit (INDEPENDENT_AMBULATORY_CARE_PROVIDER_SITE_OTHER): Payer: Worker's Compensation | Admitting: Emergency Medicine

## 2015-08-27 DIAGNOSIS — S7011XD Contusion of right thigh, subsequent encounter: Secondary | ICD-10-CM | POA: Diagnosis not present

## 2015-08-27 DIAGNOSIS — S81811D Laceration without foreign body, right lower leg, subsequent encounter: Secondary | ICD-10-CM

## 2015-08-27 DIAGNOSIS — I10 Essential (primary) hypertension: Secondary | ICD-10-CM

## 2015-08-27 LAB — COMPREHENSIVE METABOLIC PANEL
ALK PHOS: 198 U/L — AB (ref 40–115)
ALT: 90 U/L — AB (ref 9–46)
AST: 38 U/L — ABNORMAL HIGH (ref 10–35)
Albumin: 4.4 g/dL (ref 3.6–5.1)
BUN: 25 mg/dL (ref 7–25)
CALCIUM: 10.3 mg/dL (ref 8.6–10.3)
CHLORIDE: 99 mmol/L (ref 98–110)
CO2: 27 mmol/L (ref 20–31)
Creat: 1.35 mg/dL — ABNORMAL HIGH (ref 0.70–1.33)
GLUCOSE: 143 mg/dL — AB (ref 65–99)
POTASSIUM: 5.4 mmol/L — AB (ref 3.5–5.3)
Sodium: 137 mmol/L (ref 135–146)
Total Bilirubin: 0.8 mg/dL (ref 0.2–1.2)
Total Protein: 8.3 g/dL — ABNORMAL HIGH (ref 6.1–8.1)

## 2015-08-27 LAB — CBC
HEMATOCRIT: 36.5 % — AB (ref 39.0–52.0)
Hemoglobin: 12.4 g/dL — ABNORMAL LOW (ref 13.0–17.0)
MCH: 30.9 pg (ref 26.0–34.0)
MCHC: 34 g/dL (ref 30.0–36.0)
MCV: 91 fL (ref 78.0–100.0)
MPV: 8.8 fL (ref 8.6–12.4)
PLATELETS: 642 10*3/uL — AB (ref 150–400)
RBC: 4.01 MIL/uL — ABNORMAL LOW (ref 4.22–5.81)
RDW: 12.8 % (ref 11.5–15.5)
WBC: 8.2 10*3/uL (ref 4.0–10.5)

## 2015-08-27 LAB — LIPID PANEL
CHOL/HDL RATIO: 8.1 ratio — AB (ref <=5.0)
Cholesterol: 244 mg/dL — ABNORMAL HIGH (ref 125–200)
HDL: 30 mg/dL — AB (ref >=40)
LDL Cholesterol: 165 mg/dL — ABNORMAL HIGH (ref <130)
Triglycerides: 245 mg/dL — ABNORMAL HIGH (ref <150)
VLDL: 49 mg/dL — AB (ref <30)

## 2015-08-27 MED ORDER — SIMVASTATIN 80 MG PO TABS: ORAL_TABLET | ORAL | Status: DC

## 2015-08-27 MED ORDER — ALLOPURINOL 100 MG PO TABS: ORAL_TABLET | ORAL | Status: DC

## 2015-08-27 MED ORDER — AMLODIPINE BESYLATE 10 MG PO TABS: 10.0000 mg | ORAL_TABLET | Freq: Every day | ORAL | Status: DC

## 2015-08-27 MED ORDER — TADALAFIL 10 MG PO TABS: 10.0000 mg | ORAL_TABLET | ORAL | Status: DC | PRN

## 2015-08-27 MED ORDER — OXYCODONE-ACETAMINOPHEN 5-325 MG PO TABS: 1.0000 | ORAL_TABLET | ORAL | Status: DC | PRN

## 2015-08-27 MED ORDER — TELMISARTAN-HCTZ 80-12.5 MG PO TABS: 1.0000 | ORAL_TABLET | Freq: Every day | ORAL | Status: DC
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
## 2015-08-27 NOTE — Patient Instructions (Signed)

## 2015-08-29 ENCOUNTER — Telehealth: Payer: Self-pay

## 2015-08-29 NOTE — Telephone Encounter (Signed)
PT called inq. About referal request// Informed PT that request was entered by Dr. Ouida Sills and ambulatory rehab facility would contact him for time and date of appointment//  [phone]

## 2015-09-03 ENCOUNTER — Telehealth: Payer: Self-pay

## 2015-09-03 ENCOUNTER — Other Ambulatory Visit: Payer: Self-pay | Admitting: Emergency Medicine

## 2015-09-03 NOTE — Telephone Encounter (Signed)
Patient called in to request a refill on methocarbamol (ROBAXIN-750) 750 MG tablet [[id number]]   morphine (MS CONTIN) 15 MG 12 hr tablet [[id number]]  He said the pharmacy told him that he had to call us to request the refill.

## 2015-09-04 ENCOUNTER — Encounter: Payer: Self-pay | Admitting: Family Medicine

## 2015-09-05 ENCOUNTER — Ambulatory Visit (INDEPENDENT_AMBULATORY_CARE_PROVIDER_SITE_OTHER): Payer: Worker's Compensation | Admitting: Emergency Medicine

## 2015-09-05 VITALS — BP 122/90 | HR 90 | Temp 98.7°F | Resp 18 | Ht 70.0 in | Wt 216.4 lb

## 2015-09-05 DIAGNOSIS — S50852A Superficial foreign body of left forearm, initial encounter: Secondary | ICD-10-CM

## 2015-09-05 DIAGNOSIS — S51822A Laceration with foreign body of left forearm, initial encounter: Secondary | ICD-10-CM

## 2015-09-05 DIAGNOSIS — S7011XD Contusion of right thigh, subsequent encounter: Secondary | ICD-10-CM | POA: Diagnosis not present

## 2015-09-05 DIAGNOSIS — S81811D Laceration without foreign body, right lower leg, subsequent encounter: Secondary | ICD-10-CM

## 2015-09-05 NOTE — Telephone Encounter (Signed)
Absolutely NO refills on MS contin without a visit.  I did not prescribe it

## 2015-09-05 NOTE — Telephone Encounter (Signed)
Called pt to advise. He will RTC today.

## 2015-09-05 NOTE — Patient Instructions (Addendum)
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
## 2015-09-05 NOTE — Progress Notes (Signed)
  Verbal consent obtained. Local anesthesia to posterior left forearm with 3 cc of 2% lidocaine without epinephrine. Area cleansed with betadine and prepped in sterile technique. Incision with 15 blade scalpel, approximately 1 cm in length. Wound explored and one piece of cube-shaped glass approximately 0.5 cm in length was removed. Wound was explored after foreign body removal and no glass or other foreign bodies were found. Wound closed with one 4-0 Prolene simple interrupted suture. Wound cleansed and dressed.    Margrette Wynia D. Race, PA-S Physician Assistant Student [facility]

## 2015-09-06 ENCOUNTER — Telehealth: Payer: Self-pay

## 2015-09-06 NOTE — Telephone Encounter (Signed)
Patient called to check status of refill. I told him Dr. Tonette Bihari response and he states the Oxycodone is not covering his pain. The pain was so bad it woke him around 3am, pain was 9/10. He took 2 of the oxycodone because the pain was so severe, and that relieved the pain a lot. Just taking 1 tablet is not controlling pain. He wants to know if he needs to be on a different pain medication or increase oxycodone? Please advise

## 2015-09-06 NOTE — Telephone Encounter (Signed)
Spoke with pt, I advised him that he could not get a refill on the Morphine per Dr. Tonette Bihari protocol. He states Dr. Ouida Sills thought he did not need it anymore when he was seen yesterday but states he is in a lot of pain. He needs more medication. Please advise.

## 2015-09-06 NOTE — Telephone Encounter (Signed)
He called in and was told no.  He came in yesterday and agreed he did not need them as he is on percocet.  The answer remains NO  He has adequate pain meds in the percocet

## 2015-09-06 NOTE — Telephone Encounter (Signed)
He can take two percocet.  When he is out, we can revisit his medication need IN THE OFFICE.  No more morphine

## 2015-09-06 NOTE — Telephone Encounter (Signed)
Patient notified and voiced understanding.

## 2015-09-06 NOTE — Telephone Encounter (Signed)
Pt is needing to about his pain medication with dr Ouida Sills   [phone]

## 2015-09-13 ENCOUNTER — Ambulatory Visit (INDEPENDENT_AMBULATORY_CARE_PROVIDER_SITE_OTHER): Payer: Worker's Compensation | Admitting: Emergency Medicine

## 2015-09-13 VITALS — BP 130/92 | HR 77 | Temp 98.6°F | Resp 18 | Ht 70.0 in | Wt 221.0 lb

## 2015-09-13 DIAGNOSIS — S7011XD Contusion of right thigh, subsequent encounter: Secondary | ICD-10-CM

## 2015-09-13 DIAGNOSIS — S81811D Laceration without foreign body, right lower leg, subsequent encounter: Secondary | ICD-10-CM

## 2015-09-13 DIAGNOSIS — S81819A Laceration without foreign body, unspecified lower leg, initial encounter: Secondary | ICD-10-CM | POA: Insufficient documentation

## 2015-09-13 DIAGNOSIS — S7011XA Contusion of right thigh, initial encounter: Secondary | ICD-10-CM | POA: Insufficient documentation

## 2015-09-13 MED ORDER — OXYCODONE-ACETAMINOPHEN 5-325 MG PO TABS: 1.0000 | ORAL_TABLET | ORAL | Status: DC | PRN

## 2015-09-13 MED ORDER — SILDENAFIL CITRATE 100 MG PO TABS: 100.0000 mg | ORAL_TABLET | Freq: Every day | ORAL | Status: DC | PRN

## 2015-09-13 NOTE — Patient Instructions (Signed)
Quadriceps Contusion  A quadriceps contusion is a deep bruise of the large muscle in the front of your thigh. Contusions are the result of an injury that caused bleeding under the skin. The contusion may turn blue, purple, or yellow. Minor injuries will give you a painless contusion, but more severe contusions may stay painful and swollen for a few weeks. It is necessary to follow your caregiver's directions when this muscle is bruised.  CAUSES A quadriceps contusion comes from a blow or injury to the front of the leg. SYMPTOMS   Swelling and redness of the thigh area.  Bruising of the thigh area.  Tenderness or soreness of the thigh.  Limping.  Leg stiffness.  Difficulty bending the leg.  Trouble walking. DIAGNOSIS  You will have a physical exam and will be asked about your history. You may need an X-ray of your leg. TREATMENT  Often, the best treatment for a quadriceps contusion is resting and elevating the leg and applying cold compresses to the thigh area. Over-the-counter medicines may also be recommended for pain control. You may need crutches, an elastic wrap, or a leg splint.  HOME CARE INSTRUCTIONS   Put ice on the injured area.  Put ice in a plastic bag.  Place a towel between your skin and the bag.  Leave the ice on for 15-20 minutes, 03-04 times a day.  Only take over-the-counter or prescription medicines for pain, discomfort, or fever as directed by your caregiver.  Rest the injured thigh until the pain and swelling are better.  Elevate your leg to reduce swelling. Lie down flat on your back and place a pillow under your knee.  Apply compression wraps as directed by your caregiver. You may remove it for sleeping, showers, and baths. If your toes become numb, cold, or blue, take the wrap off and reapply it more loosely.  Walk or move around as the pain allows, or as directed by your caregiver. Resume full activities only when your caregiver says it is okay.  Returning to your usual activities before your caregiver approves may cause worse damage to the muscle.  See your caregiver as directed. It is very important to keep all follow-up referrals and appointments in order to avoid any long-term problems with your leg, including chronic pain or inability to move your leg normally. SEEK MEDICAL CARE IF:   You have increased bruising or swelling.  You have pain that is getting worse.  Your swelling or pain is not relieved by medicines.  Your toes or foot become cold or turn bluish in color.  You notice your thigh getting larger in size. MAKE SURE YOU:   Understand these instructions.  Will watch your condition.  Will get help right away if you are not doing well or get worse. Document Released: 08/21/2001 Document Revised: 02/18/2012 Document Reviewed: 09/11/2011 [facility] Patient Information 2015 [place], [place]. This information is not intended to replace advice given to you by your health care provider. Make sure you discuss any questions you have with your health care provider.
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
## 2015-09-27 ENCOUNTER — Ambulatory Visit (INDEPENDENT_AMBULATORY_CARE_PROVIDER_SITE_OTHER): Payer: Worker's Compensation | Admitting: Emergency Medicine

## 2015-09-27 ENCOUNTER — Ambulatory Visit (INDEPENDENT_AMBULATORY_CARE_PROVIDER_SITE_OTHER): Payer: BLUE CROSS/BLUE SHIELD

## 2015-09-27 VITALS — BP 130/90 | HR 90 | Temp 99.1°F | Resp 16 | Ht 71.5 in | Wt 222.0 lb

## 2015-09-27 DIAGNOSIS — Z23 Encounter for immunization: Secondary | ICD-10-CM

## 2015-09-27 DIAGNOSIS — S81811D Laceration without foreign body, right lower leg, subsequent encounter: Secondary | ICD-10-CM

## 2015-09-27 DIAGNOSIS — S7011XD Contusion of right thigh, subsequent encounter: Secondary | ICD-10-CM | POA: Diagnosis not present

## 2015-09-27 NOTE — Addendum Note (Signed)
Addended by: Jerl Santos R on: 09/27/2015 09:08 AM   Modules accepted: Orders

## 2015-09-27 NOTE — Patient Instructions (Signed)
Quadriceps Contusion A quadriceps contusion is a deep bruise of the large muscle in the front of your thigh. Contusions are the result of an injury that caused bleeding under the skin. The contusion may turn blue, purple, or yellow. Minor injuries will give you a painless contusion, but more severe contusions may stay painful and swollen for a few weeks. It is necessary to follow your caregiver's directions when this muscle is bruised.  CAUSES A quadriceps contusion comes from a blow or injury to the front of the leg. SYMPTOMS   Swelling and redness of the thigh area.  Bruising of the thigh area.  Tenderness or soreness of the thigh.  Limping.  Leg stiffness.  Difficulty bending the leg.  Trouble walking. DIAGNOSIS  You will have a physical exam and will be asked about your history. You may need an X-ray of your leg. TREATMENT  Often, the best treatment for a quadriceps contusion is resting and elevating the leg and applying cold compresses to the thigh area. Over-the-counter medicines may also be recommended for pain control. You may need crutches, an elastic wrap, or a leg splint.  HOME CARE INSTRUCTIONS   Put ice on the injured area.  Put ice in a plastic bag.  Place a towel between your skin and the bag.  Leave the ice on for 15-20 minutes, 03-04 times a day.  Only take over-the-counter or prescription medicines for pain, discomfort, or fever as directed by your caregiver.  Rest the injured thigh until the pain and swelling are better.  Elevate your leg to reduce swelling. Lie down flat on your back and place a pillow under your knee.  Apply compression wraps as directed by your caregiver. You may remove it for sleeping, showers, and baths. If your toes become numb, cold, or blue, take the wrap off and reapply it more loosely.  Walk or move around as the pain allows, or as directed by your caregiver. Resume full activities only when your caregiver says it is okay.  Returning to your usual activities before your caregiver approves may cause worse damage to the muscle.  See your caregiver as directed. It is very important to keep all follow-up referrals and appointments in order to avoid any long-term problems with your leg, including chronic pain or inability to move your leg normally. SEEK MEDICAL CARE IF:   You have increased bruising or swelling.  You have pain that is getting worse.  Your swelling or pain is not relieved by medicines.  Your toes or foot become cold or turn bluish in color.  You notice your thigh getting larger in size. MAKE SURE YOU:   Understand these instructions.  Will watch your condition.  Will get help right away if you are not doing well or get worse.   This information is not intended to replace advice given to you by your health care provider. Make sure you discuss any questions you have with your health care provider.   Document Released: 08/21/2001 Document Revised: 12/17/2014 Document Reviewed: 04/13/2015 Elsevier Interactive Patient Education [employer].
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
## 2015-09-28 ENCOUNTER — Telehealth: Payer: Self-pay

## 2015-09-28 NOTE — Telephone Encounter (Signed)
Advised pt to return to clinic. Pt understood.

## 2015-09-28 NOTE — Telephone Encounter (Signed)
Pt is needing a refill on his pain meds and also a revision on his work note to say he can return on 10/02/15 not 10/03/15  Best number [phone]

## 2015-09-29 ENCOUNTER — Other Ambulatory Visit: Payer: Self-pay | Admitting: Family Medicine

## 2015-09-29 ENCOUNTER — Telehealth: Payer: Self-pay

## 2015-09-29 NOTE — Telephone Encounter (Signed)
Letter printed.

## 2015-09-29 NOTE — Telephone Encounter (Signed)
Patient has been treating with Dr Ouida Sills for a Workers' Comp injury. 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  Please advise. Patients employer is [employer] at [employer] [phone]

## 2015-09-29 NOTE — Telephone Encounter (Signed)
Spoke with Barrie Folk to inform that Dr. Ouida Sills had written a new letter stating he was fit for work w/o restrictions. He states it is ok to send letter to employer and that he doesn't need to pick it up. No further questions at this time.

## 2015-09-29 NOTE — Telephone Encounter (Signed)
Dr. Anderson, please advise.

## 2015-10-11 ENCOUNTER — Ambulatory Visit (INDEPENDENT_AMBULATORY_CARE_PROVIDER_SITE_OTHER): Payer: Worker's Compensation | Admitting: Emergency Medicine

## 2015-10-11 VITALS — BP 158/98 | HR 71 | Temp 98.5°F | Resp 16 | Ht 71.5 in | Wt 232.8 lb

## 2015-10-11 DIAGNOSIS — M79601 Pain in right arm: Secondary | ICD-10-CM

## 2015-10-11 DIAGNOSIS — S50852A Superficial foreign body of left forearm, initial encounter: Secondary | ICD-10-CM

## 2015-10-11 NOTE — Progress Notes (Signed)
Procedure Consent obtained. Area encircled. Iodine prep. 1.5 cc 1% lido local anesthesia. Sterile draping placed. Incision made and glass removed. Wound irrigated and explored. #3 5-0 ethilon simple interrupted sutures placed. Clean dressing applied. Care instructions discussed.

## 2015-10-11 NOTE — Patient Instructions (Signed)
LacerationLaceration Care, Adult A laceration is a cut that goes through all of the layers of the skin and into the tissue that is right under the skin. Some lacerations heal on their own. Others need to be closed with stitches (sutures), staples, skin adhesive strips, or skin glue. Proper laceration care minimizes the risk of infection and helps the laceration to heal better. HOW TO CARE FOR YOUR LACERATION If sutures or staples were used:  Keep the wound clean and dry.  If you were given a bandage (dressing), you should change it at least one time per day or as told by your health care provider. You should also change it if it becomes wet or dirty.  Keep the wound completely dry for the first 24 hours or as told by your health care provider. After that time, you may shower or bathe. However, make sure that the wound is not soaked in water until after the sutures or staples have been removed.  Clean the wound one time each day or as told by your health care provider:  Wash the wound with soap and water.  Rinse the wound with water to remove all soap.  Pat the wound dry with a clean towel. Do not rub the wound.  After cleaning the wound, apply a thin layer of antibiotic ointmentas told by your health care provider. This will help to prevent infection and keep the dressing from sticking to the wound.  Have the sutures or staples removed as told by your health care provider. If skin adhesive strips were used:  Keep the wound clean and dry.  If you were given a bandage (dressing), you should change it at least one time per day or as told by your health care provider. You should also change it if it becomes dirty or wet.  Do not get the skin adhesive strips wet. You may shower or bathe, but be careful to keep the wound dry.  If the wound gets wet, pat it dry with a clean towel. Do not rub the wound.  Skin adhesive strips fall off on their own. You may trim the strips as the wound heals.  Do not remove skin adhesive strips that are still stuck to the wound. They will fall off in time. If skin glue was used:  Try to keep the wound dry, but you may briefly wet it in the shower or bath. Do not soak the wound in water, such as by swimming.  After you have showered or bathed, gently pat the wound dry with a clean towel. Do not rub the wound.  Do not do any activities that will make you sweat heavily until the skin glue has fallen off on its own.  Do not apply liquid, cream, or ointment medicine to the wound while the skin glue is in place. Using those may loosen the film before the wound has healed.  If you were given a bandage (dressing), you should change it at least one time per day or as told by your health care provider. You should also change it if it becomes dirty or wet.  If a dressing is placed over the wound, be careful not to apply tape directly over the skin glue. Doing that may cause the glue to be pulled off before the wound has healed.  Do not pick at the glue. The skin glue usually remains in place for 5-10 days, then it falls off of the skin. General Instructions  Take over-the-counter and prescription  medicines only as told by your health care provider.  If you were prescribed an antibiotic medicine or ointment, take or apply it as told by your doctor. Do not stop using it even if your condition improves.  To help prevent scarring, make sure to cover your wound with sunscreen whenever you are outside after stitches are removed, after adhesive strips are removed, or when glue remains in place and the wound is healed. Make sure to wear a sunscreen of at least 30 SPF.  Do not scratch or pick at the wound.  Keep all follow-up visits as told by your health care provider. This is important.  Check your wound every day for signs of infection. Watch for:  Redness, swelling, or pain.  Fluid, blood, or pus.  Raise (elevate) the injured area above the level of your  heart while you are sitting or lying down, if possible. SEEK MEDICAL CARE IF:  You received a tetanus shot and you have swelling, severe pain, redness, or bleeding at the injection site.  You have a fever.  A wound that was closed breaks open.  You notice a bad smell coming from your wound or your dressing.  You notice something coming out of the wound, such as wood or glass.  Your pain is not controlled with medicine.  You have increased redness, swelling, or pain at the site of your wound.  You have fluid, blood, or pus coming from your wound.  You notice a change in the color of your skin near your wound.  You need to change the dressing frequently due to fluid, blood, or pus draining from the wound.  You develop a new rash.  You develop numbness around the wound. SEEK IMMEDIATE MEDICAL CARE IF:  You develop severe swelling around the wound.  Your pain suddenly increases and is severe.  You develop painful lumps near the wound or on skin that is anywhere on your body.  You have a red streak going away from your wound.  The wound is on your hand or foot and you cannot properly move a finger or toe.  The wound is on your hand or foot and you notice that your fingers or toes look pale or bluish.   This information is not intended to replace advice given to you by your health care provider. Make sure you discuss any questions you have with your health care provider.   Document Released: 11/26/2005 Document Revised: 04/12/2015 Document Reviewed: 11/22/2014 Elsevier Interactive Patient Education [employer].
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
## 2015-10-23 ENCOUNTER — Ambulatory Visit (INDEPENDENT_AMBULATORY_CARE_PROVIDER_SITE_OTHER): Payer: Worker's Compensation | Admitting: Physician Assistant

## 2015-10-23 VITALS — BP 158/100 | HR 77 | Temp 98.5°F | Resp 16

## 2015-10-23 DIAGNOSIS — Z4802 Encounter for removal of sutures: Secondary | ICD-10-CM

## 2015-10-26 ENCOUNTER — Encounter: Payer: Self-pay | Admitting: Physician Assistant

## 2015-10-26 NOTE — Progress Notes (Signed)
DEMARQUIS BRAGANZA [birth year] 54 y.o.   Chief Complaint  Patient presents with  . Suture / Staple Removal     Date of Injury: 10/11/2015  History of Present Illness:  Presents for evaluation of work-related complaint.  Patient is here for suture removal of his left forearm after suture repair of a left forearm laceration at work. Day 12, and he states he could not make it here at the suggested date of return.  He has had no pain, swelling, or drainage from the wound.  ROS ROS otherwise unremarkable unless listed above.    No Known Allergies   Current medications reviewed and updated. Past medical history, family history, social history have been reviewed and updated.   Physical Exam  Constitutional: He is oriented to person, place, and time and well-developed, well-nourished, and in no distress. No distress.  HENT:  Head: Normocephalic and atraumatic.  Pulmonary/Chest: Effort normal and breath sounds normal. No respiratory distress.  Neurological: He is alert and oriented to person, place, and time.  Skin: Skin is warm and dry. He is not diaphoretic.  Left forearm with intact sutures.  No redness or swelling.  Mild purulence expressed.    Psychiatric: Mood and affect normal.     Assessment and Plan: 54 year old male with laceration of left forearm returns here for suture removal.  Sutures removed without difficulty.   No further restrictions.  Encounter for removal of sutures  Ivar Drape, [name] [facility] 11/16/20165:24 PM

## 2015-10-28 ENCOUNTER — Telehealth: Payer: Self-pay

## 2015-10-28 NOTE — Telephone Encounter (Signed)
Patient brought in [place] paperwork to be completed and returned to the patient, I have filled out what I can and highlighted what needs to be completed,I will place in your box on 10/28/15. please return to the FMLA/DISABILITY box at [address] checkout box in 5-7 business days. We will need to then call the patient to come pick them up once we have scanned them into the system so that he can complete his end of it. Thank you.

## 2015-10-28 NOTE — Addendum Note (Signed)
Addended by: Ivar Drape D on: 10/28/2015 06:18 PM   Modules accepted: Level of Service

## 2015-11-04 NOTE — Telephone Encounter (Signed)
Forms completed and scanned into system, called patient and left a message to let him know he could come by and pick them up at [address] location.

## 2015-11-23 ENCOUNTER — Telehealth: Payer: Self-pay

## 2015-11-23 NOTE — Telephone Encounter (Signed)
Spoke with patient; informed document is ready for pick up

## 2015-12-13 ENCOUNTER — Ambulatory Visit: Payer: Worker's Compensation

## 2015-12-13 ENCOUNTER — Ambulatory Visit (INDEPENDENT_AMBULATORY_CARE_PROVIDER_SITE_OTHER): Payer: Worker's Compensation | Admitting: Emergency Medicine

## 2015-12-13 VITALS — BP 167/111 | HR 68 | Temp 97.8°F | Resp 16 | Ht 72.0 in | Wt 221.0 lb

## 2015-12-13 DIAGNOSIS — M25561 Pain in right knee: Secondary | ICD-10-CM

## 2015-12-13 DIAGNOSIS — R202 Paresthesia of skin: Secondary | ICD-10-CM | POA: Diagnosis not present

## 2015-12-13 DIAGNOSIS — I10 Essential (primary) hypertension: Secondary | ICD-10-CM

## 2015-12-13 MED ORDER — NAPROXEN SODIUM 550 MG PO TABS: 550.0000 mg | ORAL_TABLET | Freq: Two times a day (BID) | ORAL | Status: DC

## 2015-12-13 MED ORDER — TELMISARTAN-HCTZ 80-12.5 MG PO TABS: 1.0000 | ORAL_TABLET | Freq: Every day | ORAL | Status: DC

## 2015-12-13 NOTE — Patient Instructions (Signed)
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
## 2015-12-30 ENCOUNTER — Encounter: Payer: Self-pay | Admitting: Family Medicine

## 2016-01-04 ENCOUNTER — Encounter: Payer: Self-pay | Admitting: Family Medicine

## 2016-10-01 ENCOUNTER — Other Ambulatory Visit: Payer: Self-pay

## 2016-10-01 NOTE — Telephone Encounter (Signed)
I have not seen this pt in 2 years and his LFTS are high- we need to see him and check LFTs prior to refilling cholesterol med,  Please ask himto come and see me

## 2016-10-01 NOTE — Telephone Encounter (Signed)
08/2015 last labs 12/2015 last ov

## 2016-10-05 NOTE — Telephone Encounter (Signed)
Please call pt to schedule appt

## 2016-10-05 NOTE — Telephone Encounter (Signed)
lvm advising patient of message below °

## 2016-11-03 ENCOUNTER — Emergency Department (HOSPITAL_COMMUNITY)
Admission: EM | Admit: 2016-11-03 | Discharge: 2016-11-04 | Disposition: A | Discharge status: Home / Self Care | Payer: BLUE CROSS/BLUE SHIELD | Attending: Emergency Medicine | Admitting: Emergency Medicine

## 2016-11-03 ENCOUNTER — Encounter (HOSPITAL_COMMUNITY): Payer: Self-pay | Admitting: *Deleted

## 2016-11-03 DIAGNOSIS — Z79899 Other long term (current) drug therapy: Secondary | ICD-10-CM | POA: Insufficient documentation

## 2016-11-03 DIAGNOSIS — L03012 Cellulitis of left finger: Secondary | ICD-10-CM | POA: Diagnosis not present

## 2016-11-03 DIAGNOSIS — I10 Essential (primary) hypertension: Secondary | ICD-10-CM | POA: Insufficient documentation

## 2016-11-03 DIAGNOSIS — M7989 Other specified soft tissue disorders: Secondary | ICD-10-CM | POA: Diagnosis present

## 2016-11-03 NOTE — ED Triage Notes (Signed)
PT is here with left index finger swelling and pain. Pt states the pain is bad and it is swelling

## 2016-11-03 NOTE — ED Notes (Signed)
Pt states he was cleaning a trailer at work and believe he may have gotten a metal splinter in his finger. RN notified.

## 2016-11-03 NOTE — ED Provider Notes (Signed)
[facility] DEPT Provider Note   CSN: [id number] Arrival date & time: 11/03/16  2203  By signing my name below, I, Gwenlyn Fudge, attest that this documentation has been prepared under the direction and in the presence of Debroah Baller, NP. Electronically Signed: Gwenlyn Fudge, ED Scribe. 11/03/16. 2:25 AM.  History   Chief Complaint Chief Complaint  Patient presents with  . Finger Injury   The history is provided by the patient. No language interpreter was used.   HPI Comments: Guy Horton is a 55 y.o. male with PMHx of HTN who presents to the Emergency Department complaining of gradual onset left finger swelling and pain onset 1 week. Pt believes that he may have had a piece of metal that may have become lodged underneath the fingernail when he was cleaning a his truck out.Pt denies fever and chills   Past Medical History:  Diagnosis Date  . Hypertension     Patient Active Problem List   Diagnosis Date Noted  . Hematoma of right thigh 09/13/2015  . Laceration of lower leg with complication 0000000  . Non compliance w medication regimen 11/20/2014  . Gout 08/13/2012  . HTN (hypertension) 08/13/2012  . Erectile dysfunction 08/13/2012  . Hypercholesteremia 08/13/2012    History reviewed. No pertinent surgical history.     Home Medications    Prior to Admission medications   Medication Sig Start Date End Date Taking? Authorizing Provider  allopurinol (ZYLOPRIM) 100 MG tablet TAKE 2 TABLETS BY MOUTH TWICE A DAY 08/27/15   Roselee Culver, MD  amLODipine (NORVASC) 10 MG tablet Take 1 tablet (10 mg total) by mouth daily. 08/27/15   Roselee Culver, MD  doxycycline (VIBRAMYCIN) 100 MG capsule Take 1 capsule (100 mg total) by mouth 2 (two) times daily. 11/04/16   Hope Bunnie Pion, NP  methocarbamol (ROBAXIN) 750 MG tablet TAKE 1 TABLET (750 MG TOTAL) BY MOUTH 3 (THREE) TIMES DAILY. 09/05/15   Roselee Culver, MD  naproxen sodium (ANAPROX DS) 550 MG tablet Take 1 tablet  (550 mg total) by mouth 2 (two) times daily with a meal. 12/13/15 12/12/16  Roselee Culver, MD  oxyCODONE-acetaminophen (ROXICET) 5-325 MG tablet Take 1 tablet by mouth every 4 (four) hours as needed for severe pain. Patient not taking: Reported on 12/13/2015 09/13/15   Roselee Culver, MD  sildenafil (VIAGRA) 100 MG tablet Take 1 tablet (100 mg total) by mouth daily as needed for erectile dysfunction. 09/13/15   Roselee Culver, MD  simvastatin (ZOCOR) 80 MG tablet TAKE 1 TABLET BY MOUTH EVERY DAY 08/27/15   Roselee Culver, MD  telmisartan-hydrochlorothiazide (MICARDIS HCT) 80-12.5 MG tablet Take 1 tablet by mouth daily. 12/13/15   Roselee Culver, MD  traMADol (ULTRAM) 50 MG tablet Take 1 tablet (50 mg total) by mouth every 6 (six) hours as needed. 11/04/16   Hope Bunnie Pion, NP    Family History Family History  Problem Relation Age of Onset  . Diabetes Mother     Social History Social History  Substance Use Topics  . Smoking status: Never Smoker  . Smokeless tobacco: Never Used  . Alcohol use No     Allergies   Patient has no known allergies.   Review of Systems Review of Systems  Constitutional: Negative for chills and fever.  Musculoskeletal: Positive for arthralgias and joint swelling.  Skin: Positive for color change.    Physical Exam Updated Vital Signs BP (!) 194/119 (BP Location: Left Arm)  Pulse 66   Temp 97.8 F (36.6 C) (Oral)   Resp 18   Ht 6' (1.829 m)   Wt 105.2 kg   SpO2 100%   BMI 31.46 kg/m   Physical Exam  Constitutional: He is oriented to person, place, and time. He appears well-developed and well-nourished. He is active. No distress.  HENT:  Head: Normocephalic and atraumatic.  Eyes: Conjunctivae are normal.  Cardiovascular: Normal rate.   Pulmonary/Chest: Effort normal. No respiratory distress.  Musculoskeletal:       Left hand: He exhibits tenderness.  Swelling to the distal aspect of the left index finger with tenderness and  infection.  Neurological: He is alert and oriented to person, place, and time.  Skin: Skin is warm and dry.  Psychiatric: He has a normal mood and affect. His behavior is normal.  Nursing note and vitals reviewed.  ED Treatments / Results  DIAGNOSTIC STUDIES: Oxygen Saturation is 99% on RA, normal by my interpretation.    COORDINATION OF CARE: 11:51 PM Discussed treatment plan with pt at bedside which includes DG Finger and pt agreed to plan.  Labs (all labs ordered are listed, but only abnormal results are displayed) Labs Reviewed - No data to display  Radiology Dg Finger Index Left  Result Date: 11/04/2016 CLINICAL DATA:  Redness and swelling around the nail of the left second finger. Patient thinks a piece of metal may have gone under the nail. Throbbing and painful. EXAM: LEFT INDEX FINGER 2+V COMPARISON:  None. FINDINGS: There is no evidence of fracture or dislocation. There is no evidence of arthropathy or other focal bone abnormality. Soft tissues are unremarkable. No radiopaque foreign bodies identified. IMPRESSION: No acute bony abnormalities. No radiopaque foreign bodies identified. Electronically Signed   By: Lucienne Capers M.D.   On: 11/04/2016 01:06    Procedures .Marland KitchenIncision and Drainage Date/Time: 11/04/2016 2:12 AM Performed by: Ashley Murrain Authorized by: Ashley Murrain   Consent:    Consent obtained:  Verbal   Consent given by:  Patient   Risks discussed:  Pain   Alternatives discussed:  No treatment Location:    Indications for incision and drainage: paronychia left index finger.   Location:  Upper extremity   Upper extremity location:  Finger   Finger location:  L index finger Pre-procedure details:    Skin preparation:  Betadine Anesthesia (see MAR for exact dosages):    Anesthesia method:  Nerve block   Block anesthetic:  Bupivacaine 0.25% w/o epi and lidocaine 1% w/o epi   Block injection procedure:  Anatomic landmarks identified, introduced needle,  incremental injection and negative aspiration for blood   Block outcome:  Anesthesia achieved Procedure type:    Complexity:  Complex Procedure details:    Needle aspiration: no     Incision types:  Single straight   Incision depth:  Dermal   Scalpel blade:  11   Wound management:  Probed and deloculated and irrigated with saline   Drainage:  Purulent   Drainage amount:  Moderate   Wound treatment:  Wound left open Post-procedure details:    Patient tolerance of procedure:  Tolerated well, no immediate complications    (including critical care time)  Medications Ordered in ED Medications  bupivacaine (PF) (MARCAINE) 0.25 % injection 10 mL (10 mLs Infiltration Given by Other 11/04/16 0147)  lidocaine (PF) (XYLOCAINE) 1 % injection 5 mL (5 mLs Infiltration Given by Other 11/04/16 0147)  doxycycline (VIBRA-TABS) tablet 100 mg (100 mg Oral Given 11/04/16  0215)  acetaminophen (TYLENOL) tablet 650 mg (650 mg Oral Given 11/04/16 0215)     Initial Impression / Assessment and Plan / ED Course  I have reviewed the triage vital signs and the nursing notes.  Dr. Roxanne Mins in to examine the patient.  Clinical Course   55 y.o. male with swelling and tenderness to the left index finger stable for d/c with improvement after I&D of paronychia. Normal x-ray.  Return precautions discussed.   Final Clinical Impressions(s) / ED Diagnoses   Final diagnoses:  Paronychia of left index finger    New Prescriptions Discharge Medication List as of 11/04/2016  2:24 AM    START taking these medications   Details  doxycycline (VIBRAMYCIN) 100 MG capsule Take 1 capsule (100 mg total) by mouth 2 (two) times daily., Starting Sun 11/04/2016, Print    traMADol (ULTRAM) 50 MG tablet Take 1 tablet (50 mg total) by mouth every 6 (six) hours as needed., Starting Sun 11/04/2016, Print         [place], NP [id number]    Delora Fuel, MD [id number]

## 2016-11-04 ENCOUNTER — Emergency Department (HOSPITAL_COMMUNITY): Payer: BLUE CROSS/BLUE SHIELD

## 2016-11-04 MED ORDER — BUPIVACAINE HCL (PF) 0.25 % IJ SOLN
10.0000 mL | Freq: Once | INTRAMUSCULAR | Status: AC
  Administered 2016-11-04: 10 mL
  Filled 2016-11-04: qty 30

## 2016-11-04 MED ORDER — DOXYCYCLINE HYCLATE 100 MG PO CAPS: 100.0000 mg | ORAL_CAPSULE | Freq: Two times a day (BID) | ORAL | 0 refills | Status: DC

## 2016-11-04 MED ORDER — DOXYCYCLINE HYCLATE 100 MG PO TABS
100.0000 mg | ORAL_TABLET | Freq: Once | ORAL | Status: AC
  Administered 2016-11-04: 100 mg via ORAL
  Filled 2016-11-04: qty 1

## 2016-11-04 MED ORDER — ACETAMINOPHEN 325 MG PO TABS
650.0000 mg | ORAL_TABLET | Freq: Once | ORAL | Status: AC
Start: 2016-11-04 — End: 2016-11-04
  Administered 2016-11-04: 650 mg via ORAL
  Filled 2016-11-04: qty 2

## 2016-11-04 MED ORDER — LIDOCAINE HCL (PF) 1 % IJ SOLN
5.0000 mL | Freq: Once | INTRAMUSCULAR | Status: AC
  Administered 2016-11-04: 5 mL
  Filled 2016-11-04: qty 5

## 2016-11-04 MED ORDER — TRAMADOL HCL 50 MG PO TABS: 50.0000 mg | ORAL_TABLET | Freq: Four times a day (QID) | ORAL | 0 refills | Status: DC | PRN

## 2016-11-04 NOTE — ED Notes (Signed)
Patient transported to x-ray. ?

## 2016-11-04 NOTE — ED Notes (Signed)
Patient states he has HTN - has not taken his high BP medication since 6am yesterday morning and will take it when he gets home. NP made aware.

## 2016-11-04 NOTE — ED Notes (Signed)
NP at bedside.

## 2016-11-04 NOTE — ED Notes (Signed)
Patient verbalized understanding of discharge instructions and denies any further needs or questions at this time. VS stable. Patient ambulatory with steady gait, declined wheelchair. RN escorted patient to ED entrance.

## 2016-11-04 NOTE — Discharge Instructions (Signed)
Do not drive while taking the pain medication as it will make you sleepy.  °

## 2016-11-06 ENCOUNTER — Ambulatory Visit (HOSPITAL_COMMUNITY)
Admission: EM | Admit: 2016-11-06 | Discharge: 2016-11-06 | Disposition: A | Discharge status: Home / Self Care | Payer: BLUE CROSS/BLUE SHIELD | Attending: Family Medicine | Admitting: Family Medicine

## 2016-11-06 ENCOUNTER — Encounter (HOSPITAL_COMMUNITY): Payer: Self-pay | Admitting: Family Medicine

## 2016-11-06 DIAGNOSIS — L03012 Cellulitis of left finger: Secondary | ICD-10-CM

## 2016-11-06 MED ORDER — OXYCODONE-ACETAMINOPHEN 5-325 MG PO TABS: 1.0000 | ORAL_TABLET | ORAL | 0 refills | Status: DC | PRN

## 2016-11-06 NOTE — ED Triage Notes (Signed)
Pt  Reports   Pain    l   Index  Finger  With pain  And pus   -  Pt  Was   Seen in  [facility]    3  Days  Ago         And     Had  A  Procedure  Done    And  Was  Given meds

## 2016-11-06 NOTE — ED Provider Notes (Signed)
[facility]    CSN: [id number] Arrival date & time: 11/06/16  1214     History   Chief Complaint Chief Complaint  Patient presents with  . Hand Pain    HPI Guy Horton is a 55 y.o. male.   HPI  Past Medical History:  Diagnosis Date  . Hypertension     Patient Active Problem List   Diagnosis Date Noted  . Hematoma of right thigh 09/13/2015  . Laceration of lower leg with complication 0000000  . Non compliance w medication regimen 11/20/2014  . Gout 08/13/2012  . HTN (hypertension) 08/13/2012  . Erectile dysfunction 08/13/2012  . Hypercholesteremia 08/13/2012    History reviewed. No pertinent surgical history.     Home Medications    Prior to Admission medications   Medication Sig Start Date End Date Taking? Authorizing Provider  allopurinol (ZYLOPRIM) 100 MG tablet TAKE 2 TABLETS BY MOUTH TWICE A DAY 08/27/15   Roselee Culver, MD  amLODipine (NORVASC) 10 MG tablet Take 1 tablet (10 mg total) by mouth daily. 08/27/15   Roselee Culver, MD  doxycycline (VIBRAMYCIN) 100 MG capsule Take 1 capsule (100 mg total) by mouth 2 (two) times daily. 11/04/16   Hope Bunnie Pion, NP  methocarbamol (ROBAXIN) 750 MG tablet TAKE 1 TABLET (750 MG TOTAL) BY MOUTH 3 (THREE) TIMES DAILY. 09/05/15   Roselee Culver, MD  naproxen sodium (ANAPROX DS) 550 MG tablet Take 1 tablet (550 mg total) by mouth 2 (two) times daily with a meal. 12/13/15 12/12/16  Roselee Culver, MD  oxyCODONE-acetaminophen (PERCOCET/ROXICET) 5-325 MG tablet Take 1-2 tablets by mouth every 4 (four) hours as needed for severe pain. 11/06/16   Robyn Haber, MD  oxyCODONE-acetaminophen (ROXICET) 5-325 MG tablet Take 1 tablet by mouth every 4 (four) hours as needed for severe pain. Patient not taking: Reported on 12/13/2015 09/13/15   Roselee Culver, MD  sildenafil (VIAGRA) 100 MG tablet Take 1 tablet (100 mg total) by mouth daily as needed for erectile dysfunction. 09/13/15   Roselee Culver,  MD  simvastatin (ZOCOR) 80 MG tablet TAKE 1 TABLET BY MOUTH EVERY DAY 08/27/15   Roselee Culver, MD  telmisartan-hydrochlorothiazide (MICARDIS HCT) 80-12.5 MG tablet Take 1 tablet by mouth daily. 12/13/15   Roselee Culver, MD  traMADol (ULTRAM) 50 MG tablet Take 1 tablet (50 mg total) by mouth every 6 (six) hours as needed. 11/04/16   Hope Bunnie Pion, NP    Family History Family History  Problem Relation Age of Onset  . Diabetes Mother     Social History Social History  Substance Use Topics  . Smoking status: Never Smoker  . Smokeless tobacco: Never Used  . Alcohol use No     Allergies   Patient has no known allergies.   Review of Systems Review of Systems   Physical Exam Triage Vital Signs ED Triage Vitals  Enc Vitals Group     BP 11/06/16 1314 (!) 175/103     Pulse Rate 11/06/16 1314 (!) 59     Resp 11/06/16 1314 16     Temp 11/06/16 1314 98.4 F (36.9 C)     Temp Source 11/06/16 1314 Oral     SpO2 11/06/16 1314 99 %     Weight --      Height --      Head Circumference --      Peak Flow --      Pain Score 11/06/16 1323 10  Pain Loc --      Pain Edu? --      Excl. in [place]? --    No data found.   Updated Vital Signs BP (!) 175/103 (BP Location: Left Arm)   Pulse (!) 59   Temp 98.4 F (36.9 C) (Oral)   Resp 16   SpO2 99%      Physical Exam  Constitutional: He is oriented to person, place, and time. He appears well-developed and well-nourished.  HENT:  Head: Normocephalic.  Right Ear: External ear normal.  Left Ear: External ear normal.  Mouth/Throat: Oropharynx is clear and moist.  Eyes: Conjunctivae and EOM are normal.  Neck: Normal range of motion. Neck supple.  Pulmonary/Chest: Effort normal.  Musculoskeletal: Normal range of motion.  Neurological: He is alert and oriented to person, place, and time.  Skin: Skin is warm and dry.  Left middle finger, distal phalanx, is swollen and quite tender diffusely with obvious recent incision at the  radial side of the cuticle and fluctuant  Nursing note and vitals reviewed.    UC Treatments / Results  Labs (all labs ordered are listed, but only abnormal results are displayed) Labs Reviewed - No data to display  EKG  EKG Interpretation None       Radiology No results found.  Procedures .Marland KitchenIncision and Drainage Date/Time: 11/06/2016 1:54 PM Performed by: Robyn Haber Authorized by: Robyn Haber   Consent:    Consent obtained:  Verbal   Consent given by:  Patient   Risks discussed:  Incomplete drainage and infection   Alternatives discussed:  No treatment Location:    Type:  Abscess   Location:  Upper extremity   Upper extremity location:  Finger   Finger location:  L index finger Pre-procedure details:    Skin preparation:  Antiseptic wash Anesthesia (see MAR for exact dosages):    Anesthesia method:  Local infiltration and nerve block   Local anesthetic:  Lidocaine 1% w/o epi   Block injection procedure:  Anatomic landmarks identified, introduced needle and negative aspiration for blood Procedure type:    Complexity:  Simple Procedure details:    Needle aspiration: no     Incision types:  Stab incision   Incision depth:  Subcutaneous   Scalpel blade:  11   Wound management:  Extensive cleaning   Drainage:  Purulent and bloody   Drainage amount:  Copious   Packing materials:  None Post-procedure details:    Patient tolerance of procedure:  Tolerated well, no immediate complications   (including critical care time)  Medications Ordered in UC Medications - No data to display   Initial Impression / Assessment and Plan / UC Course  I have reviewed the triage vital signs and the nursing notes.  Pertinent labs & imaging results that were available during my care of the patient were reviewed by me and considered in my medical decision making (see chart for details).  Clinical Course     Final Clinical Impressions(s) / UC Diagnoses   Final  diagnoses:  Paronychia of left index finger    New Prescriptions New Prescriptions   OXYCODONE-ACETAMINOPHEN (PERCOCET/ROXICET) 5-325 MG TABLET    Take 1-2 tablets by mouth every 4 (four) hours as needed for severe pain.  Continue doxycycline   Robyn Haber, MD 11/06/16 1357

## 2016-11-13 ENCOUNTER — Encounter (HOSPITAL_COMMUNITY): Payer: Self-pay | Admitting: Emergency Medicine

## 2016-11-13 ENCOUNTER — Ambulatory Visit (HOSPITAL_COMMUNITY)
Admission: EM | Admit: 2016-11-13 | Discharge: 2016-11-13 | Disposition: A | Discharge status: Home / Self Care | Payer: BLUE CROSS/BLUE SHIELD

## 2016-11-13 DIAGNOSIS — Z4800 Encounter for change or removal of nonsurgical wound dressing: Secondary | ICD-10-CM

## 2016-11-13 DIAGNOSIS — L03012 Cellulitis of left finger: Secondary | ICD-10-CM

## 2016-11-13 NOTE — ED Triage Notes (Signed)
Seen in the ed and more recently seen at [facility]  11/28.  Patient ha limited movement of finger and reports it is still numb.  Finger has some fullness, and light redness.

## 2016-11-13 NOTE — ED Provider Notes (Signed)
CSN: [id number]     Arrival date & time 11/13/16  1543 History   None    Chief Complaint  Patient presents with  . Follow-up    HPI Patient is here for follow up of paronychia of his left index finger. He was seen in the ED on 11/25 and diagnosed with paronychia due to swollen distal phalanx. X-ray of the left index finger obtained as patient thought a foreign body may have become lodged underneath the fingernail when he was cleaning his truck out. X-ray was unremarkable. I&D was done and patient was discharged with Doxycycline. He was seen in urgent care on 11/28 for re-incision and drainage. He completed 7 day course of Doxycycline which was started on 11/26. Reports it has been draining clear fluid since last I&D. But swelling has increased over the past week or so and he is unable to flex his index finger as much now. Today when he was washing his hands it started bleeding a little from the site. He has been soaking it in warm salt water to help it drain. No fevers or chills.   Past Medical History:  Diagnosis Date  . Hypertension    History reviewed. No pertinent surgical history. Family History  Problem Relation Age of Onset  . Diabetes Mother    Social History  Substance Use Topics  . Smoking status: Never Smoker  . Smokeless tobacco: Never Used  . Alcohol use No    Review of Systems: as noted above  Allergies  Patient has no known allergies.  Home Medications   Prior to Admission medications   Medication Sig Start Date End Date Taking? Authorizing Provider  allopurinol (ZYLOPRIM) 100 MG tablet TAKE 2 TABLETS BY MOUTH TWICE A DAY 08/27/15   Roselee Culver, MD  amLODipine (NORVASC) 10 MG tablet Take 1 tablet (10 mg total) by mouth daily. 08/27/15   Roselee Culver, MD  doxycycline (VIBRAMYCIN) 100 MG capsule Take 1 capsule (100 mg total) by mouth 2 (two) times daily. 11/04/16   Hope Bunnie Pion, NP  methocarbamol (ROBAXIN) 750 MG tablet TAKE 1 TABLET (750 MG TOTAL) BY  MOUTH 3 (THREE) TIMES DAILY. 09/05/15   Roselee Culver, MD  naproxen sodium (ANAPROX DS) 550 MG tablet Take 1 tablet (550 mg total) by mouth 2 (two) times daily with a meal. 12/13/15 12/12/16  Roselee Culver, MD  oxyCODONE-acetaminophen (PERCOCET/ROXICET) 5-325 MG tablet Take 1-2 tablets by mouth every 4 (four) hours as needed for severe pain. 11/06/16   Robyn Haber, MD  oxyCODONE-acetaminophen (ROXICET) 5-325 MG tablet Take 1 tablet by mouth every 4 (four) hours as needed for severe pain. Patient not taking: Reported on 12/13/2015 09/13/15   Roselee Culver, MD  sildenafil (VIAGRA) 100 MG tablet Take 1 tablet (100 mg total) by mouth daily as needed for erectile dysfunction. 09/13/15   Roselee Culver, MD  simvastatin (ZOCOR) 80 MG tablet TAKE 1 TABLET BY MOUTH EVERY DAY 08/27/15   Roselee Culver, MD  telmisartan-hydrochlorothiazide (MICARDIS HCT) 80-12.5 MG tablet Take 1 tablet by mouth daily. 12/13/15   Roselee Culver, MD  traMADol (ULTRAM) 50 MG tablet Take 1 tablet (50 mg total) by mouth every 6 (six) hours as needed. 11/04/16   Hope Bunnie Pion, NP   Meds Ordered and Administered this Visit  Medications - No data to display  BP 142/89 (BP Location: Left Arm)   Pulse 68   Temp 99.6 F (37.6 C) (Oral)   Resp 12  SpO2 98%  No data found.  Physical Exam GEN: NAD, non-toxic in appearance HEENT: normocephalic, EOMI, sclera clear  PULM:  normal effort SKIN: warm and dry  Left Index finger: distal phalanx is swollen and tender to palpation mainly at the proximal nail fold. Minimal drainage noted on nail fold closest to the radial side of the hand (small drop of blood), there is possible fluctuance noted on bilateral sides of the cuticle. No pain on the pad of the finger. Unable to fully flex index finger.  PSYCH: Mood and affect euthymic, normal rate and volume of speech NEURO: Awake, alert, no focal deficits grossly, normal speech   Urgent Care Course   Clinical Course    Patient with s/p 2 I&Ds for this paronychia on 11/25 and 11/28 without improved symptoms. Discussed with Dr. Apolonio Schneiders, hand surgery, over the phone for further recommendations. He recommended that patient follows up at his office on 12/6 at 1:30PM. Discussed with patient about plan. Recommend continued warm water soaks. Evaluated and discussed assessment and plan with Dr. Juventino Slovak prior to discharge.   Procedures Water and betadine soak to promote drainage.   Labs Review Labs Reviewed - No data to display  Imaging Review No results found.   MDM   1. Paronychia of finger of left hand   Patient with s/p 2 I&Ds for this paronychia on 11/25 and 11/28 without improved symptoms. Discussed with Dr. Apolonio Schneiders, hand surgery, over the phone for further recommendations. He recommended that patient follows up at his office on 12/6 at 1:30PM. Discussed with patient about plan. Recommend continued warm water soaks. Evaluated and discussed assessment and plan with Dr. Juventino Slovak prior to discharge.     Smiley Houseman, MD 11/13/16 [id number]    Smiley Houseman, MD 11/13/16 [phone]

## 2016-11-13 NOTE — Discharge Instructions (Signed)
We called the hand surgeon Dr. Caralyn Guile at [facility] who will see you tomorrow 12/6 at 1:30PM. Please call beforehand to inform them you were seen at urgent care and Dr. Caralyn Guile recommended follow up at his clinic on 12/6

## 2016-11-14 ENCOUNTER — Encounter (HOSPITAL_COMMUNITY): Payer: Self-pay | Admitting: Surgery

## 2016-11-14 ENCOUNTER — Ambulatory Visit (HOSPITAL_COMMUNITY): Payer: BLUE CROSS/BLUE SHIELD | Admitting: Anesthesiology

## 2016-11-14 ENCOUNTER — Encounter (HOSPITAL_COMMUNITY)
Admission: RE | Disposition: A | Discharge status: Home / Self Care | Payer: Self-pay | Source: Ambulatory Visit | Attending: Orthopedic Surgery

## 2016-11-14 ENCOUNTER — Ambulatory Visit (HOSPITAL_COMMUNITY)
Admission: RE | Admit: 2016-11-14 | Discharge: 2016-11-14 | Disposition: A | Discharge status: Home / Self Care | Payer: BLUE CROSS/BLUE SHIELD | Source: Ambulatory Visit | Attending: Orthopedic Surgery | Admitting: Orthopedic Surgery

## 2016-11-14 DIAGNOSIS — Z6831 Body mass index (BMI) 31.0-31.9, adult: Secondary | ICD-10-CM | POA: Diagnosis not present

## 2016-11-14 DIAGNOSIS — I10 Essential (primary) hypertension: Secondary | ICD-10-CM | POA: Insufficient documentation

## 2016-11-14 DIAGNOSIS — L03012 Cellulitis of left finger: Secondary | ICD-10-CM | POA: Insufficient documentation

## 2016-11-14 HISTORY — PX: I & D EXTREMITY: SHX5045

## 2016-11-14 HISTORY — DX: Hyperlipidemia, unspecified: E78.5

## 2016-11-14 HISTORY — DX: Gout, unspecified: M10.9

## 2016-11-14 LAB — BASIC METABOLIC PANEL
Anion gap: 8 (ref 5–15)
BUN: 21 mg/dL — ABNORMAL HIGH (ref 6–20)
CHLORIDE: 108 mmol/L (ref 101–111)
CO2: 25 mmol/L (ref 22–32)
CREATININE: 1.32 mg/dL — AB (ref 0.61–1.24)
Calcium: 9.4 mg/dL (ref 8.9–10.3)
GFR calc non Af Amer: >60 mL/min (ref >60)
Glucose, Bld: 109 mg/dL — ABNORMAL HIGH (ref 65–99)
POTASSIUM: 4.1 mmol/L (ref 3.5–5.1)
SODIUM: 141 mmol/L (ref 135–145)

## 2016-11-14 SURGERY — IRRIGATION AND DEBRIDEMENT EXTREMITY
Anesthesia: Monitor Anesthesia Care | Site: Finger | Laterality: Left

## 2016-11-14 MED ORDER — MEPERIDINE HCL 25 MG/ML IJ SOLN: 6.2500 mg | INTRAMUSCULAR | Status: DC | PRN

## 2016-11-14 MED ORDER — FENTANYL CITRATE (PF) 100 MCG/2ML IJ SOLN
INTRAMUSCULAR | Status: DC | PRN
  Administered 2016-11-14 (×2): 50 ug via INTRAVENOUS

## 2016-11-14 MED ORDER — BUPIVACAINE HCL (PF) 0.25 % IJ SOLN
INTRAMUSCULAR | Status: AC
  Filled 2016-11-14: qty 30

## 2016-11-14 MED ORDER — MIDAZOLAM HCL 2 MG/2ML IJ SOLN
INTRAMUSCULAR | Status: AC
  Filled 2016-11-14: qty 2

## 2016-11-14 MED ORDER — FENTANYL CITRATE (PF) 100 MCG/2ML IJ SOLN
INTRAMUSCULAR | Status: AC
  Filled 2016-11-14: qty 2

## 2016-11-14 MED ORDER — LACTATED RINGERS IV SOLN
INTRAVENOUS | Status: DC
  Administered 2016-11-14: 18:00:00 via INTRAVENOUS

## 2016-11-14 MED ORDER — CEFAZOLIN SODIUM-DEXTROSE 2-4 GM/100ML-% IV SOLN
2.0000 g | INTRAVENOUS | Status: AC
  Administered 2016-11-14: 2 g via INTRAVENOUS
  Filled 2016-11-14: qty 100

## 2016-11-14 MED ORDER — LIDOCAINE HCL (PF) 1 % IJ SOLN
INTRAMUSCULAR | Status: AC
  Filled 2016-11-14: qty 30

## 2016-11-14 MED ORDER — FENTANYL CITRATE (PF) 100 MCG/2ML IJ SOLN: 25.0000 ug | INTRAMUSCULAR | Status: DC | PRN

## 2016-11-14 MED ORDER — LIDOCAINE HCL 1 % IJ SOLN
INTRAMUSCULAR | Status: DC | PRN
  Administered 2016-11-14: 4.5 mL via INTRADERMAL

## 2016-11-14 MED ORDER — 0.9 % SODIUM CHLORIDE (POUR BTL) OPTIME
TOPICAL | Status: DC | PRN
  Administered 2016-11-14: 1000 mL

## 2016-11-14 MED ORDER — MIDAZOLAM HCL 5 MG/5ML IJ SOLN
INTRAMUSCULAR | Status: DC | PRN
  Administered 2016-11-14: 2 mg via INTRAVENOUS

## 2016-11-14 MED ORDER — BUPIVACAINE HCL (PF) 0.25 % IJ SOLN
INTRAMUSCULAR | Status: DC | PRN
  Administered 2016-11-14: 4.5 mL

## 2016-11-14 MED ORDER — LIDOCAINE 2% (20 MG/ML) 5 ML SYRINGE
INTRAMUSCULAR | Status: AC
  Filled 2016-11-14: qty 5

## 2016-11-14 MED ORDER — MIDAZOLAM HCL 2 MG/2ML IJ SOLN: 0.5000 mg | Freq: Once | INTRAMUSCULAR | Status: DC | PRN

## 2016-11-14 MED ORDER — CHLORHEXIDINE GLUCONATE 4 % EX LIQD: 60.0000 mL | Freq: Once | CUTANEOUS | Status: DC

## 2016-11-14 MED ORDER — PROMETHAZINE HCL 25 MG/ML IJ SOLN: 6.2500 mg | INTRAMUSCULAR | Status: DC | PRN

## 2016-11-14 MED ORDER — DOXYCYCLINE HYCLATE 100 MG PO CAPS: 100.0000 mg | ORAL_CAPSULE | Freq: Two times a day (BID) | ORAL | 0 refills | Status: DC

## 2016-11-14 MED ORDER — PROPOFOL 500 MG/50ML IV EMUL
INTRAVENOUS | Status: DC | PRN
  Administered 2016-11-14: 100 ug/kg/min via INTRAVENOUS

## 2016-11-14 MED ORDER — LIDOCAINE 2% (20 MG/ML) 5 ML SYRINGE
INTRAMUSCULAR | Status: DC | PRN
  Administered 2016-11-14: 80 mg via INTRAVENOUS

## 2016-11-14 SURGICAL SUPPLY — 61 items
BANDAGE ACE 4X5 VEL STRL LF (GAUZE/BANDAGES/DRESSINGS) ×1 IMPLANT
BANDAGE COBAN STERILE 2 (GAUZE/BANDAGES/DRESSINGS) ×2 IMPLANT
BANDAGE ELASTIC 3 VELCRO ST LF (GAUZE/BANDAGES/DRESSINGS) ×1 IMPLANT
BNDG CMPR 9X4 STRL LF SNTH (GAUZE/BANDAGES/DRESSINGS) ×1
BNDG COHESIVE 1X5 TAN STRL LF (GAUZE/BANDAGES/DRESSINGS) IMPLANT
BNDG CONFORM 2 STRL LF (GAUZE/BANDAGES/DRESSINGS) ×2 IMPLANT
BNDG ESMARK 4X9 LF (GAUZE/BANDAGES/DRESSINGS) ×3 IMPLANT
BNDG GAUZE ELAST 4 BULKY (GAUZE/BANDAGES/DRESSINGS) ×1 IMPLANT
CORDS BIPOLAR (ELECTRODE) ×3 IMPLANT
COVER SURGICAL LIGHT HANDLE (MISCELLANEOUS) ×3 IMPLANT
CUFF TOURNIQUET SINGLE 18IN (TOURNIQUET CUFF) ×3 IMPLANT
CUFF TOURNIQUET SINGLE 24IN (TOURNIQUET CUFF) IMPLANT
DRAIN PENROSE 1/4X12 LTX STRL (WOUND CARE) IMPLANT
DRAPE SURG 17X23 STRL (DRAPES) ×3 IMPLANT
DRSG ADAPTIC 3X8 NADH LF (GAUZE/BANDAGES/DRESSINGS) ×3 IMPLANT
DRSG EMULSION OIL 3X3 NADH (GAUZE/BANDAGES/DRESSINGS) ×2 IMPLANT
ELECT REM PT RETURN 9FT ADLT (ELECTROSURGICAL) ×3
ELECTRODE REM PT RTRN 9FT ADLT (ELECTROSURGICAL) IMPLANT
GAUZE SPONGE 4X4 12PLY STRL (GAUZE/BANDAGES/DRESSINGS) ×3 IMPLANT
GAUZE XEROFORM 1X8 LF (GAUZE/BANDAGES/DRESSINGS) ×3 IMPLANT
GAUZE XEROFORM 5X9 LF (GAUZE/BANDAGES/DRESSINGS) IMPLANT
GLOVE BIOGEL PI IND STRL 8.5 (GLOVE) ×1 IMPLANT
GLOVE BIOGEL PI INDICATOR 8.5 (GLOVE) ×2
GLOVE SURG ORTHO 8.0 STRL STRW (GLOVE) ×3 IMPLANT
GOWN STRL REUS W/ TWL LRG LVL3 (GOWN DISPOSABLE) ×3 IMPLANT
GOWN STRL REUS W/ TWL XL LVL3 (GOWN DISPOSABLE) ×1 IMPLANT
GOWN STRL REUS W/TWL LRG LVL3 (GOWN DISPOSABLE) ×3
GOWN STRL REUS W/TWL XL LVL3 (GOWN DISPOSABLE) ×3
HANDPIECE INTERPULSE COAX TIP (DISPOSABLE)
KIT BASIN OR (CUSTOM PROCEDURE TRAY) ×3 IMPLANT
KIT ROOM TURNOVER OR (KITS) ×3 IMPLANT
MANIFOLD NEPTUNE II (INSTRUMENTS) ×1 IMPLANT
NDL 18GX1X1/2 (RX/OR ONLY) (NEEDLE) IMPLANT
NDL HYPO 25GX1X1/2 BEV (NEEDLE) IMPLANT
NEEDLE 18GX1X1/2 (RX/OR ONLY) (NEEDLE) ×3 IMPLANT
NEEDLE HYPO 25GX1X1/2 BEV (NEEDLE) IMPLANT
NS IRRIG 1000ML POUR BTL (IV SOLUTION) ×3 IMPLANT
PACK ORTHO EXTREMITY (CUSTOM PROCEDURE TRAY) ×3 IMPLANT
PAD ARMBOARD 7.5X6 YLW CONV (MISCELLANEOUS) ×6 IMPLANT
PAD CAST 4YDX4 CTTN HI CHSV (CAST SUPPLIES) ×1 IMPLANT
PADDING CAST COTTON 4X4 STRL (CAST SUPPLIES)
SET HNDPC FAN SPRY TIP SCT (DISPOSABLE) IMPLANT
SOAP 2 % CHG 4 OZ (WOUND CARE) ×3 IMPLANT
SPONGE LAP 18X18 X RAY DECT (DISPOSABLE) ×1 IMPLANT
SPONGE LAP 4X18 X RAY DECT (DISPOSABLE) ×3 IMPLANT
SUCTION FRAZIER HANDLE 10FR (MISCELLANEOUS)
SUCTION TUBE FRAZIER 10FR DISP (MISCELLANEOUS) ×1 IMPLANT
SUT CHROMIC 5 0 P 3 (SUTURE) ×2 IMPLANT
SUT ETHILON 4 0 PS 2 18 (SUTURE) IMPLANT
SUT ETHILON 5 0 P 3 18 (SUTURE)
SUT NYLON ETHILON 5-0 P-3 1X18 (SUTURE) IMPLANT
SWAB COLLECTION DEVICE MRSA (MISCELLANEOUS) ×2 IMPLANT
SYR CONTROL 10ML LL (SYRINGE) IMPLANT
SYRINGE 10CC LL (SYRINGE) ×2 IMPLANT
TOWEL OR 17X24 6PK STRL BLUE (TOWEL DISPOSABLE) ×3 IMPLANT
TOWEL OR 17X26 10 PK STRL BLUE (TOWEL DISPOSABLE) ×3 IMPLANT
TUBE ANAEROBIC SPECIMEN COL (MISCELLANEOUS) ×2 IMPLANT
TUBE CONNECTING 12'X1/4 (SUCTIONS)
TUBE CONNECTING 12X1/4 (SUCTIONS) ×1 IMPLANT
UNDERPAD 30X30 (UNDERPADS AND DIAPERS) ×3 IMPLANT
YANKAUER SUCT BULB TIP NO VENT (SUCTIONS) ×1 IMPLANT

## 2016-11-14 NOTE — Discharge Instructions (Signed)
KEEP BANDAGE CLEAN AND DRY CALL OFFICE FOR F/U APPT [phone] in 2 days KEEP HAND ELEVATED ABOVE HEART OK TO APPLY ICE TO OPERATIVE AREA CONTACT OFFICE IF ANY WORSENING PAIN OR CONCERNS.  

## 2016-11-14 NOTE — Anesthesia Postprocedure Evaluation (Signed)
Anesthesia Post Note  Patient: Guy Horton  Procedure(s) Performed: Procedure(s) (LRB): IRRIGATION AND DEBRIDEMENT LEFT INDEX FINGER (Left)  Patient location during evaluation: PACU Anesthesia Type: MAC Level of consciousness: awake and alert, oriented and patient cooperative Pain management: pain level controlled Vital Signs Assessment: post-procedure vital signs reviewed and stable Respiratory status: spontaneous breathing, nonlabored ventilation and respiratory function stable Cardiovascular status: blood pressure returned to baseline and stable Postop Assessment: no signs of nausea or vomiting Anesthetic complications: no    Last Vitals:  Vitals:   11/14/16 1945 11/14/16 2000  BP: (!) 167/103 (!) 155/86  Pulse: (!) 56 (!) 56  Resp: 11 15  Temp:      Last Pain:  Vitals:   11/14/16 1930  TempSrc:   PainSc: 0-No pain                 Jarry Manon,E. Mikhail Hallenbeck

## 2016-11-14 NOTE — Transfer of Care (Signed)
Immediate Anesthesia Transfer of Care Note  Patient: Guy Horton  Procedure(s) Performed: Procedure(s): IRRIGATION AND DEBRIDEMENT LEFT INDEX FINGER (Left)  Patient Location: PACU  Anesthesia Type:MAC  Level of Consciousness: awake, alert , oriented and patient cooperative  Airway & Oxygen Therapy: Patient Spontanous Breathing and Patient connected to nasal cannula oxygen  Post-op Assessment: Report given to RN, Post -op Vital signs reviewed and stable and Patient moving all extremities  Post vital signs: Reviewed and stable  Last Vitals:  Vitals:   11/14/16 1711  BP: (!) 162/98  Pulse: (!) 59  Resp: 16  Temp: 36.7 C    Last Pain:  Vitals:   11/14/16 1711  TempSrc: Oral  PainSc: 3       Patients Stated Pain Goal: 3 ([id number])  Complications: No apparent anesthesia complications

## 2016-11-14 NOTE — Anesthesia Preprocedure Evaluation (Signed)
Anesthesia Evaluation  Patient identified by MRN, date of birth, ID band Patient awake    Reviewed: Allergy & Precautions, NPO status , Patient's Chart, lab work & pertinent test results  History of Anesthesia Complications Negative for: history of anesthetic complications  Airway Mallampati: II  TM Distance: >3 FB Neck ROM: Full    Dental  (+) Poor Dentition, Missing, Dental Advisory Given, Chipped   Pulmonary neg pulmonary ROS,    breath sounds clear to auscultation       Cardiovascular hypertension, Pt. on medications (-) angina Rhythm:Regular Rate:Normal     Neuro/Psych negative neurological ROS     GI/Hepatic negative GI ROS, Neg liver ROS,   Endo/Other  Morbid obesity  Renal/GU negative Renal ROS     Musculoskeletal   Abdominal (+) + obese,   Peds  Hematology negative hematology ROS (+)   Anesthesia Other Findings   Reproductive/Obstetrics                             Anesthesia Physical Anesthesia Plan  ASA: II  Anesthesia Plan: MAC   Post-op Pain Management:    Induction:   Airway Management Planned: Natural Airway  Additional Equipment:   Intra-op Plan:   Post-operative Plan:   Informed Consent: I have reviewed the patients History and Physical, chart, labs and discussed the procedure including the risks, benefits and alternatives for the proposed anesthesia with the patient or authorized representative who has indicated his/her understanding and acceptance.   Dental advisory given  Plan Discussed with: CRNA and Surgeon  Anesthesia Plan Comments: (Plan routine monitors, MAC)        Anesthesia Quick Evaluation

## 2016-11-14 NOTE — H&P (Signed)
  Guy Horton is an 55 y.o. male.   Chief Complaint: PARONYCHIA OF LEFT INDEX FINGER  HPI: PATIENT BITES NAILS AND AFTER BITING OFF A HANG NAIL, HE BEGAN TO HAVE PAIN AND SWELLING OF THE LEFT INDEX FINGER. HAS HAD AN I&D OF THE FINGER IN [place] AND AN URGENT CARE. COMPLETED A 7 DAY COURSE OF DOXYCYCLINE. STILL HAS PURULENT DRAINAGE FROM THE FINGER WHEN SEEN IN THE OFFICE TODAY.  PATIENT PRESENTS FOR SURGERY.  Past Medical History:  Diagnosis Date  . Hypertension     No past surgical history on file.  Family History  Problem Relation Age of Onset  . Diabetes Mother    Social History:  reports that he has never smoked. He has never used smokeless tobacco. He reports that he does not drink alcohol or use drugs.  Allergies: No Known Allergies  No prescriptions prior to admission.    No results found for this or any previous visit (from the past 48 hour(s)). No results found.  ROS NO RECENT ILLNESSES OR HOSPITALIZATIONS.  There were no vitals taken for this visit. Physical Exam  General Appearance:  Alert, cooperative, no distress, appears stated age  Head:  Normocephalic, without obvious abnormality, atraumatic  Eyes:  Pupils equal, conjunctiva/corneas clear,         Throat: Lips, mucosa, and tongue normal; teeth and gums normal  Neck: No visible masses     Lungs:   respirations unlabored  Chest Wall:  No tenderness or deformity  Heart:  Regular rate and rhythm,  Abdomen:   Soft, non-tender,         Extremities: Left index: marked swelling over distal phalangeal area Small purulunce from eponychium No wounds to long/ring/small and thumb  Good wrist and forearm mobility  Pulses: 2+ and symmetric  Skin: Skin color, texture, turgor normal, no rashes or lesions     Neurologic: Normal    Assessment PARONYCHIA OF LEFT INDEX FINGER/DEEP SPACE INFECTION  Plan IRRIGATION AND DEBRIDEMENT OF LEFT INDEX FINGER/INCISION AND DRAINAGE  R/B/A DISCUSSED WITH PT IN  OFFICE.  PT VOICED UNDERSTANDING OF PLAN CONSENT SIGNED DAY OF SURGERY PT SEEN AND EXAMINED PRIOR TO OPERATIVE PROCEDURE/DAY OF SURGERY SITE MARKED. QUESTIONS ANSWERED WILL GO HOME FOLLOWING SURGERY  WE ARE PLANNING SURGERY FOR YOUR UPPER EXTREMITY. THE RISKS AND BENEFITS OF SURGERY INCLUDE BUT NOT LIMITED TO BLEEDING INFECTION, DAMAGE TO NEARBY NERVES ARTERIES TENDONS, FAILURE OF SURGERY TO ACCOMPLISH ITS INTENDED GOALS, PERSISTENT SYMPTOMS AND NEED FOR FURTHER SURGICAL INTERVENTION. WITH THIS IN MIND WE WILL PROCEED. I HAVE DISCUSSED WITH THE PATIENT THE PRE AND POSTOPERATIVE REGIMEN AND THE DOS AND DON'TS. PT VOICED UNDERSTANDING AND INFORMED CONSENT SIGNED.   Brynda Peon 11/14/2016, 4:38 PM

## 2016-11-15 ENCOUNTER — Encounter (HOSPITAL_COMMUNITY): Payer: Self-pay | Admitting: Orthopedic Surgery

## 2016-11-16 NOTE — Op Note (Signed)
NAMEJACERE, TORTORELLA                ACCOUNT NO.:  [id number]  MEDICAL RECORD NO.:  [id number]  LOCATION:                                 FACILITY:  PHYSICIAN:  Melrose Nakayama, M.D.    DATE OF BIRTH:  DATE OF PROCEDURE:  11/14/2016 DATE OF DISCHARGE:                              OPERATIVE REPORT   PREOPERATIVE DIAGNOSIS:  Left index finger deep space infection.  POSTOPERATIVE DIAGNOSIS:  Left index finger deep space infection.  ATTENDING PHYSICIAN:  Linna Hoff, M.D., who scrubbed and present for the entire procedure.  ASSISTANT SURGEON:  None.  ANESTHESIA:  Xylocaine 1% and 0.4% Marcaine, local block.  PROCEDURE: 1. Debridement of complicated abscess, left index finger. 2. Debridement of nail bed and nail plate, left index finger.  SURGICAL INDICATIONS:  Mr. Jeneen Rinks is a right-hand-dominant gentleman, who have been followed with a persistent infection in the left index finger. The patient was referred to my office with the infection, and recommend undergo the above procedure.  Risks, benefits, and alternatives were discussed in detail with the patient, signed informed consent was obtained.  Risks include, but not limited to bleeding, infection, damage to nearby nerves, arteries, or tendons; loss of motion of wrist and digits, incomplete relief of symptoms, need for further surgical intervention.  DESCRIPTION OF PROCEDURE:  The patient was properly identified in the preoperative holding area, marked with a permanent marker, made on left index finger to indicate correct operative site.  The patient was then brought back to the operating room, placed supine on the anesthesia room table where the IV sedation was administered.  The local anesthetic was administered.  The well-padded tourniquet was placed on left brachium and sealed with 1000 drape.  Left upper extremity was then prepped and draped in normal sterile fashion.  Time-out was called, correct site  was identified, and procedure then begun.  The nail plate was then carefully removed with a Soil scientist.  The tourniquet had been insufflated. Dissection was carried down through the skin and subcutaneous tissue, along the radial border of the eponychium.  The purulent tissue was then removed and tissue cultures were then sent.  Deep dissection all the way came down to the finger with a pulse, there did not appear to be abscess collection within the pulp.  Debridement of the paronychia was then carried out circumferentially over the nail plate.  The nail plate, nail bed were then debrided with small curettes and rongeurs.  The wound was then thoroughly irrigated.  After debridement of the nail bed and the nail plate, and debridement of complicated area of abscess, the wound was then irrigated.  Cultures were then taken.  The wound was irrigated and the skin was then loosely reapproximated to the nail and rent in the nail bed was then repaired with chromic sutures.  Adaptic dressing and sterile compressive bandage were then applied.  The patient tolerated the procedure well, returned to recovery room in good condition.  POSTPROCEDURE PLAN:  The patient discharged home, seen back in the office in 2 days for wound check, and began an outpatient therapy regimen for wound care and continue on the oral antibiotics.  We will follow him closely.     Melrose Nakayama, M.D.     FWO/MEDQ  D:  11/14/2016  T:  11/15/2016  Job:  [id number]

## 2016-11-21 LAB — AEROBIC/ANAEROBIC CULTURE (SURGICAL/DEEP WOUND)

## 2017-01-11 ENCOUNTER — Telehealth: Payer: Self-pay | Admitting: Family Medicine

## 2017-01-11 DIAGNOSIS — I10 Essential (primary) hypertension: Secondary | ICD-10-CM

## 2017-01-11 NOTE — Telephone Encounter (Addendum)
[facility] has called stating that pt has been out of his BP medicine for a week and he had called them for them to give him a few until we give him a refill they state that since its Telmisartan-HCTZ they cant open up bottle to dispense a small amount so they called [place] to refill his medicine pt was last here to see dr Ouida Sills on 12-13-2015

## 2017-01-14 ENCOUNTER — Ambulatory Visit (INDEPENDENT_AMBULATORY_CARE_PROVIDER_SITE_OTHER): Payer: BLUE CROSS/BLUE SHIELD | Admitting: Urgent Care

## 2017-01-14 VITALS — BP 150/100 | HR 80 | Temp 98.3°F | Resp 16 | Ht 70.0 in | Wt 235.4 lb

## 2017-01-14 DIAGNOSIS — Z8739 Personal history of other diseases of the musculoskeletal system and connective tissue: Secondary | ICD-10-CM

## 2017-01-14 DIAGNOSIS — I10 Essential (primary) hypertension: Secondary | ICD-10-CM

## 2017-01-14 DIAGNOSIS — E785 Hyperlipidemia, unspecified: Secondary | ICD-10-CM

## 2017-01-14 MED ORDER — TELMISARTAN-HCTZ 80-12.5 MG PO TABS: 1.0000 | ORAL_TABLET | Freq: Every day | ORAL | 0 refills | Status: DC

## 2017-01-14 MED ORDER — ALLOPURINOL 100 MG PO TABS: ORAL_TABLET | ORAL | 1 refills | Status: DC

## 2017-01-14 MED ORDER — AMLODIPINE BESYLATE 5 MG PO TABS: 5.0000 mg | ORAL_TABLET | Freq: Every day | ORAL | 1 refills | Status: DC

## 2017-01-14 MED ORDER — TELMISARTAN-HCTZ 80-12.5 MG PO TABS: 1.0000 | ORAL_TABLET | Freq: Every day | ORAL | 1 refills | Status: DC

## 2017-01-14 NOTE — Progress Notes (Signed)
  MRN: [id number] DOB: 19-Dec-1960  Subjective:   Guy Horton is a 56 y.o. male presenting for chief complaint of Medication Refill (simvastatin, MICARDIS HCT, and  Allopurinol)  HTN - Managed with Micardis. Patient has not taken his BP medication for a refill because he is out of his medication. Diet is non-compliant. Patient reports that his blood pressure was higher than [id number] diastolic and remained in high [id number] systolic. Avoids salt in his diet. Denies dizziness, chronic headache, blurred vision, chest pain, shortness of breath, heart racing, palpitations, nausea, vomiting, abdominal pain, hematuria, lower leg swelling. Stays active with his work. Denies smoking cigarettes. Drinks ~4 beers per day, is not interested in quitting.  HL - Managed with simvastatin. Diet and exercise as above.  Gout - Managed with allopurinol for prevention.  Guy Horton has a current medication list which includes the following prescription(s): allopurinol, simvastatin, and telmisartan-hydrochlorothiazide. Also has No Known Allergies.  Guy Horton  has a past medical history of Gout; Hyperlipemia; and Hypertension. Also  has a past surgical history that includes Colonoscopy and I&D extremity (Left, 11/14/2016).  Objective:   Vitals: BP (!) 150/100 (BP Location: Left Arm, Patient Position: Sitting, Cuff Size: Large)   Pulse 80   Temp 98.3 F (36.8 C) (Oral)   Resp 16   Ht 5\' 10"  (1.778 m)   Wt 235 lb 6.4 oz (106.8 kg)   SpO2 96%   BMI 33.78 kg/m   BP Readings from Last 3 Encounters:  01/14/17 (!) 150/100  11/14/16 (!) 164/96  11/13/16 142/89    Physical Exam  Constitutional: He is oriented to person, place, and time. He appears well-developed and well-nourished.  HENT:  Mouth/Throat: Oropharynx is clear and moist.  Eyes: No scleral icterus.  Cardiovascular: Normal rate, regular rhythm and intact distal pulses.  Exam reveals no gallop and no friction rub.   No murmur heard. Pulmonary/Chest: No respiratory  distress. He has no wheezes. He has no rales.  Abdominal: Soft. Bowel sounds are normal. He exhibits no distension and no mass. There is no tenderness. There is no guarding.  Musculoskeletal: He exhibits no edema.  Neurological: He is alert and oriented to person, place, and time.  Skin: Skin is warm and dry.   Assessment and Plan :   1. Essential hypertension - Not controlled. Refilled Micardis. Start amlodipine. Labs pending. Recommended lifestyle modifications. Patient is to f/u in 4 weeks.  2. Hyperlipidemia, unspecified hyperlipidemia type - Lipid panel pending. Plan is to switch to atorvastatin so that patient may take amlodipine for better blood pressure control.  3. History of gout - Refilled allopurinol. Labs pending.  Jaynee Eagles, [name] [facility] [id number] 01/14/2017  10:42 AM

## 2017-01-14 NOTE — Telephone Encounter (Signed)
Pt advised and tx up front 

## 2017-01-14 NOTE — Telephone Encounter (Signed)
I will give the patient a 30d supply but he needs an appt before more refills.

## 2017-01-14 NOTE — Patient Instructions (Addendum)
Hypertension Hypertension, commonly called high blood pressure, is when the force of blood pumping through your arteries is too strong. Your arteries are the blood vessels that carry blood from your heart throughout your body. A blood pressure reading consists of a higher number over a lower number, such as 110/72. The higher number (systolic) is the pressure inside your arteries when your heart pumps. The lower number (diastolic) is the pressure inside your arteries when your heart relaxes. Ideally you want your blood pressure below 120/80. Hypertension forces your heart to work harder to pump blood. Your arteries may become narrow or stiff. Having untreated or uncontrolled hypertension can cause heart attack, stroke, kidney disease, and other problems. What increases the risk? Some risk factors for high blood pressure are controllable. Others are not. Risk factors you cannot control include:  Race. You may be at higher risk if you are African American.  Age. Risk increases with age.  Gender. Men are at higher risk than women before age 45 years. After age 65, women are at higher risk than men. Risk factors you can control include:  Not getting enough exercise or physical activity.  Being overweight.  Getting too much fat, sugar, calories, or salt in your diet.  Drinking too much alcohol. What are the signs or symptoms? Hypertension does not usually cause signs or symptoms. Extremely high blood pressure (hypertensive crisis) may cause headache, anxiety, shortness of breath, and nosebleed. How is this diagnosed? To check if you have hypertension, your health care provider will measure your blood pressure while you are seated, with your arm held at the level of your heart. It should be measured at least twice using the same arm. Certain conditions can cause a difference in blood pressure between your right and left arms. A blood pressure reading that is higher than normal on one occasion does  not mean that you need treatment. If it is not clear whether you have high blood pressure, you may be asked to return on a different day to have your blood pressure checked again. Or, you may be asked to monitor your blood pressure at home for 1 or more weeks. How is this treated? Treating high blood pressure includes making lifestyle changes and possibly taking medicine. Living a healthy lifestyle can help lower high blood pressure. You may need to change some of your habits. Lifestyle changes may include:  Following the DASH diet. This diet is high in fruits, vegetables, and whole grains. It is low in salt, red meat, and added sugars.  Keep your sodium intake below 2,300 mg per day.  Getting at least 30-45 minutes of aerobic exercise at least 4 times per week.  Losing weight if necessary.  Not smoking.  Limiting alcoholic beverages.  Learning ways to reduce stress. Your health care provider may prescribe medicine if lifestyle changes are not enough to get your blood pressure under control, and if one of the following is true:  You are 18-59 years of age and your systolic blood pressure is above 140.  You are 60 years of age or older, and your systolic blood pressure is above 150.  Your diastolic blood pressure is above 90.  You have diabetes, and your systolic blood pressure is over 140 or your diastolic blood pressure is over 90.  You have kidney disease and your blood pressure is above 140/90.  You have heart disease and your blood pressure is above 140/90. Your personal target blood pressure may vary depending on your medical   conditions, your age, and other factors. Follow these instructions at home:  Have your blood pressure rechecked as directed by your health care provider.  Take medicines only as directed by your health care provider. Follow the directions carefully. Blood pressure medicines must be taken as prescribed. The medicine does not work as well when you skip  doses. Skipping doses also puts you at risk for problems.  Do not smoke.  Monitor your blood pressure at home as directed by your health care provider. Contact a health care provider if:  You think you are having a reaction to medicines taken.  You have recurrent headaches or feel dizzy.  You have swelling in your ankles.  You have trouble with your vision. Get help right away if:  You develop a severe headache or confusion.  You have unusual weakness, numbness, or feel faint.  You have severe chest or abdominal pain.  You vomit repeatedly.  You have trouble breathing. This information is not intended to replace advice given to you by your health care provider. Make sure you discuss any questions you have with your health care provider. Document Released: 11/26/2005 Document Revised: 05/03/2016 Document Reviewed: 09/18/2013 Elsevier Interactive Patient Education  2017 Elsevier Inc.    Amlodipine tablets What is this medicine? AMLODIPINE (am LOE di peen) is a calcium-channel blocker. It affects the amount of calcium found in your heart and muscle cells. This relaxes your blood vessels, which can reduce the amount of work the heart has to do. This medicine is used to lower high blood pressure. It is also used to prevent chest pain. This medicine may be used for other purposes; ask your health care provider or pharmacist if you have questions. COMMON BRAND NAME(S): Norvasc What should I tell my health care provider before I take this medicine? They need to know if you have any of these conditions: -heart problems like heart failure or aortic stenosis -liver disease -an unusual or allergic reaction to amlodipine, other medicines, foods, dyes, or preservatives -pregnant or trying to get pregnant -breast-feeding How should I use this medicine? Take this medicine by mouth with a glass of water. Follow the directions on the prescription label. Take your medicine at regular  intervals. Do not take more medicine than directed. Talk to your pediatrician regarding the use of this medicine in children. Special care may be needed. This medicine has been used in children as young as 6. Persons over 31 years old may have a stronger reaction to this medicine and need smaller doses. Overdosage: If you think you have taken too much of this medicine contact a poison control center or emergency room at once. NOTE: This medicine is only for you. Do not share this medicine with others. What if I miss a dose? If you miss a dose, take it as soon as you can. If it is almost time for your next dose, take only that dose. Do not take double or extra doses. What may interact with this medicine? -herbal or dietary supplements -local or general anesthetics -medicines for high blood pressure -medicines for prostate problems -rifampin This list may not describe all possible interactions. Give your health care provider a list of all the medicines, herbs, non-prescription drugs, or dietary supplements you use. Also tell them if you smoke, drink alcohol, or use illegal drugs. Some items may interact with your medicine. What should I watch for while using this medicine? Visit your doctor or health care professional for regular check ups. Check your  blood pressure and pulse rate regularly. Ask your health care professional what your blood pressure and pulse rate should be, and when you should contact him or her. This medicine may make you feel confused, dizzy or lightheaded. Do not drive, use machinery, or do anything that needs mental alertness until you know how this medicine affects you. To reduce the risk of dizzy or fainting spells, do not sit or stand up quickly, especially if you are an older patient. Avoid alcoholic drinks; they can make you more dizzy. Do not suddenly stop taking amlodipine. Ask your doctor or health care professional how you can gradually reduce the dose. What side  effects may I notice from receiving this medicine? Side effects that you should report to your doctor or health care professional as soon as possible: -allergic reactions like skin rash, itching or hives, swelling of the face, lips, or tongue -breathing problems -changes in vision or hearing -chest pain -fast, irregular heartbeat -swelling of legs or ankles Side effects that usually do not require medical attention (report to your doctor or health care professional if they continue or are bothersome): -dry mouth -facial flushing -nausea, vomiting -stomach gas, pain -tired, weak -trouble sleeping This list may not describe all possible side effects. Call your doctor for medical advice about side effects. You may report side effects to FDA at 1-800-FDA-1088. Where should I keep my medicine? Keep out of the reach of children. Store at room temperature between 59 and 86 degrees F (15 and 30 degrees C). Protect from light. Keep container tightly closed. Throw away any unused medicine after the expiration date. NOTE: This sheet is a summary. It may not cover all possible information. If you have questions about this medicine, talk to your doctor, pharmacist, or health care provider.  2017 Elsevier/Gold Standard (2012-10-24 11:40:58)    IF you received an x-ray today, you will receive an invoice from [facility]. Please contact [facility] at [phone] with questions or concerns regarding your invoice.   IF you received labwork today, you will receive an invoice from [facility]. Please contact [facility] at [phone] with questions or concerns regarding your invoice.   Our billing staff will not be able to assist you with questions regarding bills from these companies.  You will be contacted with the lab results as soon as they are available. The fastest way to get your results is to activate your My Chart account. Instructions are located on the last page of this  paperwork. If you have not heard from [place] regarding the results in 2 weeks, please contact this office.

## 2017-01-15 LAB — CMP14+EGFR
A/G RATIO: 1.5 (ref 1.2–2.2)
ALT: 23 IU/L (ref 0–44)
AST: 19 IU/L (ref 0–40)
Albumin: 4.6 g/dL (ref 3.5–5.5)
Alkaline Phosphatase: 52 IU/L (ref 39–117)
BILIRUBIN TOTAL: 0.5 mg/dL (ref 0.0–1.2)
BUN / CREAT RATIO: 15 (ref 9–20)
BUN: 18 mg/dL (ref 6–24)
CALCIUM: 9.3 mg/dL (ref 8.7–10.2)
CHLORIDE: 103 mmol/L (ref 96–106)
CO2: 22 mmol/L (ref 18–29)
Creatinine, Ser: 1.19 mg/dL (ref 0.76–1.27)
GFR calc non Af Amer: 69 mL/min/{1.73_m2} (ref >59)
GFR, EST AFRICAN AMERICAN: 80 mL/min/{1.73_m2} (ref >59)
GLOBULIN, TOTAL: 3 g/dL (ref 1.5–4.5)
Glucose: 99 mg/dL (ref 65–99)
POTASSIUM: 4.6 mmol/L (ref 3.5–5.2)
SODIUM: 141 mmol/L (ref 134–144)
TOTAL PROTEIN: 7.6 g/dL (ref 6.0–8.5)

## 2017-01-15 LAB — LIPID PANEL
CHOL/HDL RATIO: 5 ratio (ref 0.0–5.0)
Cholesterol, Total: 222 mg/dL — ABNORMAL HIGH (ref 100–199)
HDL: 44 mg/dL (ref >39)
LDL CALC: 147 mg/dL — AB (ref 0–99)
Triglycerides: 154 mg/dL — ABNORMAL HIGH (ref 0–149)
VLDL CHOLESTEROL CAL: 31 mg/dL (ref 5–40)

## 2017-01-15 LAB — URIC ACID: URIC ACID: 10 mg/dL — AB (ref 3.7–8.6)

## 2017-01-15 LAB — MICROALBUMIN / CREATININE URINE RATIO
Creatinine, Urine: 153.8 mg/dL
MICROALB/CREAT RATIO: 162.9 mg/g{creat} — AB (ref 0.0–30.0)
Microalbumin, Urine: 250.5 ug/mL

## 2017-01-16 ENCOUNTER — Other Ambulatory Visit: Payer: Self-pay | Admitting: Urgent Care

## 2017-01-16 MED ORDER — ATORVASTATIN CALCIUM 40 MG PO TABS: 40.0000 mg | ORAL_TABLET | Freq: Every day | ORAL | 1 refills | Status: DC

## 2017-02-14 ENCOUNTER — Ambulatory Visit: Payer: Self-pay | Admitting: Urgent Care

## 2017-03-07 ENCOUNTER — Ambulatory Visit: Payer: Self-pay | Admitting: Urgent Care

## 2017-04-04 ENCOUNTER — Ambulatory Visit: Payer: Self-pay | Admitting: Urgent Care

## 2017-07-08 ENCOUNTER — Encounter (HOSPITAL_COMMUNITY): Payer: Self-pay | Admitting: Physician Assistant

## 2017-07-08 ENCOUNTER — Ambulatory Visit (HOSPITAL_COMMUNITY)
Admission: EM | Admit: 2017-07-08 | Discharge: 2017-07-08 | Disposition: A | Discharge status: Home / Self Care | Payer: BLUE CROSS/BLUE SHIELD | Attending: Family Medicine | Admitting: Family Medicine

## 2017-07-08 DIAGNOSIS — K6289 Other specified diseases of anus and rectum: Secondary | ICD-10-CM

## 2017-07-08 DIAGNOSIS — K921 Melena: Secondary | ICD-10-CM | POA: Diagnosis not present

## 2017-07-08 LAB — OCCULT BLOOD, POC DEVICE: Fecal Occult Bld: POSITIVE — AB

## 2017-07-08 MED ORDER — DOCUSATE SODIUM 50 MG PO CAPS: 50.0000 mg | ORAL_CAPSULE | Freq: Two times a day (BID) | ORAL | 0 refills | Status: DC

## 2017-07-08 MED ORDER — POLYETHYLENE GLYCOL 3350 17 G PO PACK: 17.0000 g | PACK | Freq: Every day | ORAL | 0 refills | Status: DC

## 2017-07-08 NOTE — Discharge Instructions (Signed)
There was some blood in your rectum. Given your age and no colonoscopy in the past, follow up with PCP for colonoscopy. Take colace and miralax to soften your stool. Monitor for other causes of rectal irritation. If you are experiencing gross blood in the bowl, follow up at the ED for further evaluation.

## 2017-07-08 NOTE — ED Provider Notes (Signed)
CSN: [id number]     Arrival date & time 07/08/17  0956 History   None    Chief Complaint  Patient presents with  . Hemorrhoids   (Consider location/radiation/quality/duration/timing/severity/associated sxs/prior Treatment) 56 year old male comes in with 2 weeks history of rectal irritation after bowel movements. He denies any constipation, but does state mild straining. States he has regular bowel movements every day, but has been going once every 2 days due to the irritation. He tried a suppository yesterday with no relief. He denies blood in the stool. 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 He has no history of hemorrhoids, colon disease. No personal or family history of colon cancer. Denies ever having a colonoscopy. Denies fever, chills, night sweats. Denies abdominal pain, nausea, vomiting, diarrhea.      Past Medical History:  Diagnosis Date  . Gout   . Hyperlipemia   . Hypertension    Past Surgical History:  Procedure Laterality Date  . COLONOSCOPY    . I&D EXTREMITY Left 11/14/2016   Procedure: IRRIGATION AND DEBRIDEMENT LEFT INDEX FINGER;  Surgeon: [name], MD;  Location: [facility];  Service: Orthopedics;  Laterality: Left;   Family History  Problem Relation Age of Onset  . Diabetes Mother    Social History  Substance Use Topics  . Smoking status: Never Smoker  . Smokeless tobacco: Never Used  . Alcohol use No    Review of Systems  Reason unable to perform ROS: See HPI as above.    Allergies  Patient has no known allergies.  Home Medications   Prior to Admission medications   Medication Sig Start Date End Date Taking? Authorizing Provider  allopurinol (ZYLOPRIM) 100 MG tablet TAKE 2 TABLETS BY MOUTH TWICE A DAY 01/14/17   Jaynee Eagles, [name]  amLODipine (NORVASC) 5 MG tablet Take 1 tablet (5 mg total) by mouth daily. 01/14/17   Jaynee Eagles, [name]  atorvastatin (LIPITOR) 40 MG tablet Take 1 tablet (40 mg  total) by mouth daily. 01/16/17   Jaynee Eagles, [name]  docusate sodium (COLACE) 50 MG capsule Take 1 capsule (50 mg total) by mouth 2 (two) times daily. 07/08/17   Tasia Catchings, Calib Wadhwa V, [name]  polyethylene glycol (MIRALAX) packet Take 17 g by mouth daily. 07/08/17   Tasia Catchings, Christine Morton V, [name]  telmisartan-hydrochlorothiazide (MICARDIS HCT) 80-12.5 MG tablet Take 1 tablet by mouth daily. 01/14/17   Jaynee Eagles, [name]   Meds Ordered and Administered this Visit  Medications - No data to display  BP (!) 149/79 (BP Location: Left Arm) Comment: notified rn  Pulse 89   Temp 98.5 F (36.9 C) (Oral)   Resp 16   SpO2 97%  No data found.   Physical Exam  Constitutional: He is oriented to person, place, and time. He appears well-developed and well-nourished. No distress.  HENT:  Head: Normocephalic and atraumatic.  Eyes: Pupils are equal, round, and reactive to light. Conjunctivae are normal.  Genitourinary:  Genitourinary Comments: No internal or external hemorrhoids noted. Guaiac tested positive. Anal tone normal.   Neurological: He is alert and oriented to person, place, and time.  Skin: Skin is warm and dry.  Psychiatric: He has a normal mood and affect. His behavior is normal. Judgment normal.    Urgent Care Course     Procedures (including critical care time)  Labs Review Labs Reviewed - No data to display  Imaging Review No results found.  MDM   1. Rectal irritation   2. Blood in stool    Discussed with patient possible causes of rectal irritation. No obvious external or internal hemorrhoids noted on exam. Given patient irritation experienced with wiping and bowel movement, and patient with some straining with bowel movement, discussed continued stool softeners to prevent further injury by hard stools. Given patient age over 79, without history of colonoscopy, to follow up with PCP for further evaluation and referrals needed. Return precautions given.    Ok Edwards, [name] 07/08/17 2059

## 2017-07-08 NOTE — ED Triage Notes (Signed)
Pt  Reports     Symptoms    Of  Rectal  Irritation       And       Discomfort       For  Several     Weeks  Pt  Reports   sensation  Of  Irritation  After  bm    Pt   Is  Awake   And  Alert         Oriented       Appearing in  No acute   Distress

## 2017-07-09 ENCOUNTER — Ambulatory Visit (INDEPENDENT_AMBULATORY_CARE_PROVIDER_SITE_OTHER): Payer: BLUE CROSS/BLUE SHIELD | Admitting: Family Medicine

## 2017-07-09 ENCOUNTER — Encounter: Payer: Self-pay | Admitting: Family Medicine

## 2017-07-09 VITALS — BP 138/92 | HR 79 | Temp 97.8°F | Resp 16 | Ht 70.0 in | Wt 200.0 lb

## 2017-07-09 DIAGNOSIS — K625 Hemorrhage of anus and rectum: Secondary | ICD-10-CM | POA: Diagnosis not present

## 2017-07-09 DIAGNOSIS — Z8739 Personal history of other diseases of the musculoskeletal system and connective tissue: Secondary | ICD-10-CM

## 2017-07-09 DIAGNOSIS — Z7289 Other problems related to lifestyle: Secondary | ICD-10-CM

## 2017-07-09 DIAGNOSIS — Z125 Encounter for screening for malignant neoplasm of prostate: Secondary | ICD-10-CM

## 2017-07-09 DIAGNOSIS — Z1211 Encounter for screening for malignant neoplasm of colon: Secondary | ICD-10-CM

## 2017-07-09 DIAGNOSIS — Z0000 Encounter for general adult medical examination without abnormal findings: Secondary | ICD-10-CM

## 2017-07-09 DIAGNOSIS — I10 Essential (primary) hypertension: Secondary | ICD-10-CM

## 2017-07-09 DIAGNOSIS — E785 Hyperlipidemia, unspecified: Secondary | ICD-10-CM

## 2017-07-09 DIAGNOSIS — Z789 Other specified health status: Secondary | ICD-10-CM

## 2017-07-09 DIAGNOSIS — R634 Abnormal weight loss: Secondary | ICD-10-CM

## 2017-07-09 DIAGNOSIS — Z130 Encounter for screening for diseases of the blood and blood-forming organs and certain disorders involving the immune mechanism: Secondary | ICD-10-CM | POA: Diagnosis not present

## 2017-07-09 DIAGNOSIS — R252 Cramp and spasm: Secondary | ICD-10-CM

## 2017-07-09 DIAGNOSIS — M1A9XX0 Chronic gout, unspecified, without tophus (tophi): Secondary | ICD-10-CM | POA: Diagnosis not present

## 2017-07-09 MED ORDER — ATORVASTATIN CALCIUM 40 MG PO TABS: 40.0000 mg | ORAL_TABLET | Freq: Every day | ORAL | 1 refills | Status: DC

## 2017-07-09 MED ORDER — TELMISARTAN-HCTZ 80-12.5 MG PO TABS: 1.0000 | ORAL_TABLET | Freq: Every day | ORAL | 1 refills | Status: DC

## 2017-07-09 MED ORDER — AMLODIPINE BESYLATE 5 MG PO TABS: 5.0000 mg | ORAL_TABLET | Freq: Every day | ORAL | 1 refills | Status: DC

## 2017-07-09 MED ORDER — ALLOPURINOL 100 MG PO TABS: ORAL_TABLET | ORAL | 1 refills | Status: DC

## 2017-07-09 NOTE — Progress Notes (Signed)
Subjective:  This chart was scribed for [name], MD by Tamsen Roers, at [place] at [facility].  This patient was seen in room 10 and the patient's care was started at 8:46 AM.   Chief Complaint  Patient presents with  . Annual Exam    colonoscopy referral pended  . Medication Refill    norvasc     Patient ID: Guy Horton, male    DOB: [birth year], 56 y.o.   MRN: [id number]   HPI HPI Comments: Guy Horton is a 56 y.o. male who presents to Primary Care at [facility] for an annual physical exam. Patient has been working the last three weeks in hot temperatures. He is still working at the same job but is now is working in different conditions than in the past. He states that he has lost around 20 pounds over the last two weeks and is concerned about this.   He had cramping in his legs the first couple of days working in these new conditions and states that he felt "much better" after rehydrating (with Gatorade and water).  He still has some cramping intermittently. Patient is not a smoker.    Hypertension: Patient is currently taking Norvasc 5 mg QD and micardis 80-12.5.  There has been previous discussion of coming off of HCTZ with history of gout and elevated uric acid. Amlodipine was added in February visit.  --- He has been checking his blood pressures at home with numbers that have been ranging around 170/74-80.  He drinks about 4 beers per night and has not changed his drinking habits since his last visit. He does not feel addicted. He feels "thirsty" after work and drinks beer for this.  Lab Results  Component Value Date   CREATININE 1.19 01/14/2017    Hyperlipidemia: He was switched to Lipitor 40 mg QD in February--- Patient is compliant with his medications. He has not had anything to eat or drink this morning.  Lab Results  Component Value Date   CHOL 222 (H) 01/14/2017   HDL 44 01/14/2017   LDLCALC 147 (H) 01/14/2017   TRIG 154 (H) 01/14/2017   CHOLHDL  5.0 01/14/2017   Lab Results  Component Value Date   ALT 23 01/14/2017   AST 19 01/14/2017   ALKPHOS 52 01/14/2017   BILITOT 0.5 01/14/2017    Gout: He was continued on allopurinol 200 mg qd. --- Patient has not had any recent flare ups of gout (for about a year) and is compliant with his medication.   Lab Results  Component Value Date   LABURIC 10.0 (H) 01/14/2017    Cancer screening:  Colon cancer: He was seen yesterday in the ER with rectal irritation and blood in the stool. Recommended referral for colonoscopy.  Common causes of rectal irritation and blood in the stool were discussed in the ER. Positive fecal occult test yesterday.  --- Patient has never had a colonoscopy in the past.  He states that he did not notice any blood in the stool prior to his visit to the ED. His rectal irritation/"tear sensation"  has been going on the the past 2.5 weeks.  He does have days where it feels better but once he uses the restroom, he has  irritation again.  He did have hard stool about 3 weeks ago which especially bothered him.  Denies constipation or difficulty with bowel movements.  Prostate cancer screening: Lab Results  Component Value Date   PSA 3.32 05/14/2013  PSA 2.49 03/18/2012    Immunizations:  Immunization History  Administered Date(s) Administered  . Influenza,inj,Quad PF,36+ Mos 09/27/2015  . Tdap 03/18/2012    Depression Screening:  Depression screen [facility] 2/9 07/09/2017 01/14/2017 12/13/2015 09/27/2015 09/05/2015  Decreased Interest 0 0 0 0 0  Down, Depressed, Hopeless 0 0 0 0 0  PHQ - 2 Score 0 0 0 0 0    Vision: Patient last saw his eye doctor about 7 months ago.  He does not use his glasses while driving and feels that his vision is "a little worse".   Visual Acuity Screening   Right eye Left eye Both eyes  Without correction: 20/70 20/40 20/50   With correction:        Patient Active Problem List   Diagnosis Date Noted  . Hematoma of right thigh 09/13/2015  .  Laceration of lower leg with complication 42/70/6237  . Non compliance w medication regimen 11/20/2014  . Gout 08/13/2012  . HTN (hypertension) 08/13/2012  . Erectile dysfunction 08/13/2012  . Hypercholesteremia 08/13/2012   Past Medical History:  Diagnosis Date  . Gout   . Hyperlipemia   . Hypertension    Past Surgical History:  Procedure Laterality Date  . COLONOSCOPY    . I&D EXTREMITY Left 11/14/2016   Procedure: IRRIGATION AND DEBRIDEMENT LEFT INDEX FINGER;  Surgeon: [name], MD;  Location: [employer];  Service: Orthopedics;  Laterality: Left;   No Known Allergies Prior to Admission medications   Medication Sig Start Date End Date Taking? Authorizing Provider  allopurinol (ZYLOPRIM) 100 MG tablet TAKE 2 TABLETS BY MOUTH TWICE A DAY 01/14/17   Jaynee Eagles, [name]  amLODipine (NORVASC) 5 MG tablet Take 1 tablet (5 mg total) by mouth daily. 01/14/17   Jaynee Eagles, [name]  atorvastatin (LIPITOR) 40 MG tablet Take 1 tablet (40 mg total) by mouth daily. 01/16/17   Jaynee Eagles, [name]  docusate sodium (COLACE) 50 MG capsule Take 1 capsule (50 mg total) by mouth 2 (two) times daily. 07/08/17   Tasia Catchings, Amy V, [name]  polyethylene glycol (MIRALAX) packet Take 17 g by mouth daily. 07/08/17   Tasia Catchings, Amy V, [name]  telmisartan-hydrochlorothiazide (MICARDIS HCT) 80-12.5 MG tablet Take 1 tablet by mouth daily. 01/14/17   Jaynee Eagles, [name]   Social History   Social History  . Marital status: Married    Spouse name: N/A  . Number of children: N/A  . Years of education: N/A   Occupational History  . Not on file.   Social History Main Topics  . Smoking status: Never Smoker  . Smokeless tobacco: Never Used  . Alcohol use No  . Drug use: No  . Sexual activity: Not on file   Other Topics Concern  . Not on file   Social History Narrative  . No narrative on file     Review of Systems  Constitutional: Positive for unexpected weight change.  Gastrointestinal: Positive for rectal pain.  Musculoskeletal:        Muscle cramping  All other systems reviewed and are negative. 13 point ROS negative other than discussed above.      Objective:   Physical Exam  Constitutional: He is oriented to person, place, and time. He appears well-developed and well-nourished.  HENT:  Head: Normocephalic and atraumatic.  Right Ear: External ear normal.  Left Ear: External ear normal.  Mouth/Throat: Oropharynx is clear and moist.  Eyes: Pupils are equal, round, and reactive to light. Conjunctivae and EOM are normal.  Neck: Normal  range of motion. Neck supple. No thyromegaly present.  Cardiovascular: Normal rate, regular rhythm, normal heart sounds and intact distal pulses.   Pulmonary/Chest: Effort normal and breath sounds normal. No respiratory distress. He has no wheezes.  Abdominal: Soft. He exhibits no distension. There is no tenderness. Hernia confirmed negative in the right inguinal area and confirmed negative in the left inguinal area.  Genitourinary: Prostate normal.  Musculoskeletal: Normal range of motion. He exhibits no edema or tenderness.  Lymphadenopathy:    He has no cervical adenopathy.  Neurological: He is alert and oriented to person, place, and time. He has normal reflexes.  Skin: Skin is warm and dry.  Psychiatric: He has a normal mood and affect. His behavior is normal.  Vitals reviewed.   Vitals:   07/09/17 0828  BP: (!) 138/92  Pulse: 79  Resp: 16  Temp: 97.8 F (36.6 C)  TempSrc: Oral  SpO2: 99%  Weight: 200 lb (90.7 kg)  Height: 5\' 10"  (1.778 m)       Assessment & Plan:   Guy Horton is a 56 y.o. male Annual physical exam  - -anticipatory guidance as below in AVS, screening labs above. Health maintenance items as above in HPI discussed/recommended as applicable.   Loss of weight - Plan: TSH, Sedimentation Rate  - as above. May be related to work environment, but will check TSH, sedimentation rate, then return to discuss further.  - Cancer screening including PSA and  referral to gastroenterology for colonoscopy was ordered.  Essential hypertension - Plan: Comprehensive metabolic panel, amLODipine (NORVASC) 5 MG tablet, telmisartan-hydrochlorothiazide (MICARDIS HCT) 80-12.5 MG tablet  -Elevated in office, not at goal. Agreed to maintain same medication if he can decrease alcohol consumption as that is likely a contributor. CMP pending   Rectal bleeding - Plan: Ambulatory referral to Gastroenterology  -No concerns on exam in office. Differential includes hemorrhoids or anal fissures. Refer to gastroenterology for screening colonoscopy and can evaluate further at that time. If worsening pain, or acute bleeding, return for recheck exam  Screening for prostate cancer - Plan: PSA  - We discussed pros and cons of prostate cancer screening, and after this discussion, he chose to have screening done. PSA obtained, and no concerning findings on DRE.   Screen for colon cancer - Plan: Ambulatory referral to Gastroenterology  Muscle cramps - Plan: CK  - Discussed maintain hydration other than alcohol while working. Check CPK. RTC precautions if persistent.  History of gout - Plan: allopurinol (ZYLOPRIM) 100 MG tablet Chronic gout without tophus, unspecified cause, unspecified site - Plan: Uric Acid  -Overall stable without reported recent flare even with elevated uric acid previously. Decrease alcohol consumption, continue same dose of allopurinol for now.  Hyperlipidemia, unspecified hyperlipidemia type - Plan: Lipid panel, atorvastatin (LIPITOR) 40 MG tablet  -Tolerating Lipitor, check lipid panel, continue same dose  Alcohol use  -Discussed impact on hypertension, and other risks of alcohol overuse. Plans to cut back. Advised to let me know if he needs resources or difficulty with cutting back  Screening, anemia, deficiency, iron - Plan: CBC   Meds ordered this encounter  Medications  . allopurinol (ZYLOPRIM) 100 MG tablet    Sig: TAKE 2 TABLETS BY MOUTH  TWICE A DAY    Dispense:  180 tablet    Refill:  1  . amLODipine (NORVASC) 5 MG tablet    Sig: Take 1 tablet (5 mg total) by mouth daily.    Dispense:  90 tablet  Refill:  1  . atorvastatin (LIPITOR) 40 MG tablet    Sig: Take 1 tablet (40 mg total) by mouth daily.    Dispense:  90 tablet    Refill:  1  . telmisartan-hydrochlorothiazide (MICARDIS HCT) 80-12.5 MG tablet    Sig: Take 1 tablet by mouth daily.    Dispense:  90 tablet    Refill:  1   Patient Instructions   If you can cut back on alcohol, can keep same dose blood pressure meds for now. Recheck in next 3-4 weeks.   I will check gout test, but that should also improve with cutting back on alcohol. If you have trouble cutting back, let me know.   Follow up with your eye care provider about changes in vision, and should wear glasses as vision today indicates you may need those to drive.   I will refer you to gastroenterologist, but continue to drink plenty of water and stool softener if needed. If anal irritation worsens or blood seen - return for recheck,   Keeping you healthy  Get these tests  Blood pressure- Have your blood pressure checked once a year by your healthcare provider.  Normal blood pressure is 120/80  Weight- Have your body mass index (BMI) calculated to screen for obesity.  BMI is a measure of body fat based on height and weight. You can also calculate your own BMI at [email or web address].  Cholesterol- Have your cholesterol checked every year.  Diabetes- Have your blood sugar checked regularly if you have high blood pressure, high cholesterol, have a family history of diabetes or if you are overweight.  Screening for Colon Cancer- Colonoscopy starting at age 30.  Screening may begin sooner depending on your family history and other health conditions. Follow up colonoscopy as directed by your Gastroenterologist.  Screening for Prostate Cancer- Both blood work (PSA) and a rectal exam help screen for  Prostate Cancer.  Screening begins at age 89 with African-American men and at age 65 with Caucasian men.  Screening may begin sooner depending on your family history.  Take these medicines  Aspirin- One aspirin daily can help prevent Heart disease and Stroke.  Flu shot- Every fall.  Tetanus- Every 10 years.  Zostavax- Once after the age of 18 to prevent Shingles.  Pneumonia shot- Once after the age of 62; if you are younger than 49, ask your healthcare provider if you need a Pneumonia shot.  Take these steps  Don't smoke- If you do smoke, talk to your doctor about quitting.  For tips on how to quit, go to www.smokefree.gov or call 1-800-QUIT-NOW.  Be physically active- Exercise 5 days a week for at least 30 minutes.  If you are not already physically active start slow and gradually work up to 30 minutes of moderate physical activity.  Examples of moderate activity include walking briskly, mowing the yard, dancing, swimming, bicycling, etc.  Eat a healthy diet- Eat a variety of healthy food such as fruits, vegetables, low fat milk, low fat cheese, yogurt, lean meant, poultry, fish, beans, tofu, etc. For more information go to www.thenutritionsource.org  Drink alcohol in moderation- Limit alcohol intake to less than two drinks a day. Never drink and drive.  Dentist- Brush and floss twice daily; visit your dentist twice a year.  Depression- Your emotional health is as important as your physical health. If you're feeling down, or losing interest in things you would normally enjoy please talk to your healthcare provider.  Eye exam- Visit your  eye doctor every year.  Safe sex- If you may be exposed to a sexually transmitted infection, use a condom.  Seat belts- Seat belts can save your life; always wear one.  Smoke/Carbon Monoxide detectors- These detectors need to be installed on the appropriate level of your home.  Replace batteries at least once a year.  Skin cancer- When out in the  sun, cover up and use sunscreen 15 SPF or higher.  Violence- If anyone is threatening you, please tell your healthcare provider.  Living Will/ Health care power of attorney- Speak with your healthcare provider and family.  IF you received an x-ray today, you will receive an invoice from [facility]. Please contact [facility] at [phone] with questions or concerns regarding your invoice.   IF you received labwork today, you will receive an invoice from [facility]. Please contact [facility] at [phone] with questions or concerns regarding your invoice.   Our billing staff will not be able to assist you with questions regarding bills from these companies.  You will be contacted with the lab results as soon as they are available. The fastest way to get your results is to activate your My Chart account. Instructions are located on the last page of this paperwork. If you have not heard from [place] regarding the results in 2 weeks, please contact this office.       I personally performed the services described in this documentation, which was scribed in my presence. The recorded information has been reviewed and considered for accuracy and completeness, addended by me as needed, and agree with information above.  Signed,   [name], MD Primary Care at [place].  07/11/17 2:30 PM

## 2017-07-09 NOTE — Patient Instructions (Addendum)
If you can cut back on alcohol, can keep same dose blood pressure meds for now. Recheck in next 3-4 weeks.   I will check gout test, but that should also improve with cutting back on alcohol. If you have trouble cutting back, let me know.   Follow up with your eye care provider about changes in vision, and should wear glasses as vision today indicates you may need those to drive.   I will refer you to gastroenterologist, but continue to drink plenty of water and stool softener if needed. If anal irritation worsens or blood seen - return for recheck,   Keeping you healthy  Get these tests  Blood pressure- Have your blood pressure checked once a year by your healthcare provider.  Normal blood pressure is 120/80  Weight- Have your body mass index (BMI) calculated to screen for obesity.  BMI is a measure of body fat based on height and weight. You can also calculate your own BMI at [email or web address].  Cholesterol- Have your cholesterol checked every year.  Diabetes- Have your blood sugar checked regularly if you have high blood pressure, high cholesterol, have a family history of diabetes or if you are overweight.  Screening for Colon Cancer- Colonoscopy starting at age 21.  Screening may begin sooner depending on your family history and other health conditions. Follow up colonoscopy as directed by your Gastroenterologist.  Screening for Prostate Cancer- Both blood work (PSA) and a rectal exam help screen for Prostate Cancer.  Screening begins at age 38 with African-American men and at age 75 with Caucasian men.  Screening may begin sooner depending on your family history.  Take these medicines  Aspirin- One aspirin daily can help prevent Heart disease and Stroke.  Flu shot- Every fall.  Tetanus- Every 10 years.  Zostavax- Once after the age of 40 to prevent Shingles.  Pneumonia shot- Once after the age of 49; if you are younger than 4, ask your healthcare provider if you need a  Pneumonia shot.  Take these steps  Don't smoke- If you do smoke, talk to your doctor about quitting.  For tips on how to quit, go to www.smokefree.gov or call 1-800-QUIT-NOW.  Be physically active- Exercise 5 days a week for at least 30 minutes.  If you are not already physically active start slow and gradually work up to 30 minutes of moderate physical activity.  Examples of moderate activity include walking briskly, mowing the yard, dancing, swimming, bicycling, etc.  Eat a healthy diet- Eat a variety of healthy food such as fruits, vegetables, low fat milk, low fat cheese, yogurt, lean meant, poultry, fish, beans, tofu, etc. For more information go to www.thenutritionsource.org  Drink alcohol in moderation- Limit alcohol intake to less than two drinks a day. Never drink and drive.  Dentist- Brush and floss twice daily; visit your dentist twice a year.  Depression- Your emotional health is as important as your physical health. If you're feeling down, or losing interest in things you would normally enjoy please talk to your healthcare provider.  Eye exam- Visit your eye doctor every year.  Safe sex- If you may be exposed to a sexually transmitted infection, use a condom.  Seat belts- Seat belts can save your life; always wear one.  Smoke/Carbon Monoxide detectors- These detectors need to be installed on the appropriate level of your home.  Replace batteries at least once a year.  Skin cancer- When out in the sun, cover up and use sunscreen 15 SPF or  higher.  Violence- If anyone is threatening you, please tell your healthcare provider.  Living Will/ Health care power of attorney- Speak with your healthcare provider and family.  IF you received an x-ray today, you will receive an invoice from [facility]. Please contact [facility] at [phone] with questions or concerns regarding your invoice.   IF you received labwork today, you will receive an invoice from  [place]. Please contact [facility] at [phone] with questions or concerns regarding your invoice.   Our billing staff will not be able to assist you with questions regarding bills from these companies.  You will be contacted with the lab results as soon as they are available. The fastest way to get your results is to activate your My Chart account. Instructions are located on the last page of this paperwork. If you have not heard from [place] regarding the results in 2 weeks, please contact this office.

## 2017-07-10 LAB — SEDIMENTATION RATE: Sed Rate: 12 mm/hr (ref 0–30)

## 2017-07-10 LAB — COMPREHENSIVE METABOLIC PANEL
ALBUMIN: 4.8 g/dL (ref 3.5–5.5)
ALK PHOS: 87 IU/L (ref 39–117)
ALT: 18 IU/L (ref 0–44)
AST: 16 IU/L (ref 0–40)
Albumin/Globulin Ratio: 1.5 (ref 1.2–2.2)
BILIRUBIN TOTAL: 0.7 mg/dL (ref 0.0–1.2)
BUN / CREAT RATIO: 14 (ref 9–20)
BUN: 19 mg/dL (ref 6–24)
CHLORIDE: 90 mmol/L — AB (ref 96–106)
CO2: 20 mmol/L (ref 20–29)
Calcium: 10.2 mg/dL (ref 8.7–10.2)
Creatinine, Ser: 1.33 mg/dL — ABNORMAL HIGH (ref 0.76–1.27)
GFR calc non Af Amer: 61 mL/min/{1.73_m2} (ref >59)
GFR, EST AFRICAN AMERICAN: 70 mL/min/{1.73_m2} (ref >59)
GLOBULIN, TOTAL: 3.2 g/dL (ref 1.5–4.5)
Glucose: 486 mg/dL — ABNORMAL HIGH (ref 65–99)
Potassium: 4.8 mmol/L (ref 3.5–5.2)
SODIUM: 132 mmol/L — AB (ref 134–144)
TOTAL PROTEIN: 8 g/dL (ref 6.0–8.5)

## 2017-07-10 LAB — CBC
Hematocrit: 45 % (ref 37.5–51.0)
Hemoglobin: 15.4 g/dL (ref 13.0–17.7)
MCH: 31.4 pg (ref 26.6–33.0)
MCHC: 34.2 g/dL (ref 31.5–35.7)
MCV: 92 fL (ref 79–97)
Platelets: 320 10*3/uL (ref 150–379)
RBC: 4.9 x10E6/uL (ref 4.14–5.80)
RDW: 12.7 % (ref 12.3–15.4)
WBC: 6.8 10*3/uL (ref 3.4–10.8)

## 2017-07-10 LAB — LIPID PANEL
CHOLESTEROL TOTAL: 373 mg/dL — AB (ref 100–199)
Chol/HDL Ratio: 12.9 ratio — ABNORMAL HIGH (ref 0.0–5.0)
HDL: 29 mg/dL — ABNORMAL LOW (ref >39)
Triglycerides: 1470 mg/dL (ref 0–149)

## 2017-07-10 LAB — URIC ACID: URIC ACID: 6.4 mg/dL (ref 3.7–8.6)

## 2017-07-10 LAB — TSH: TSH: 1.28 u[IU]/mL (ref 0.450–4.500)

## 2017-07-10 LAB — CK: CK TOTAL: 103 U/L (ref 24–204)

## 2017-07-10 LAB — PSA: PROSTATE SPECIFIC AG, SERUM: 3 ng/mL (ref 0.0–4.0)

## 2017-07-16 ENCOUNTER — Encounter (HOSPITAL_COMMUNITY): Payer: Self-pay | Admitting: Emergency Medicine

## 2017-07-16 ENCOUNTER — Encounter: Payer: Self-pay | Admitting: Family Medicine

## 2017-07-16 ENCOUNTER — Observation Stay (HOSPITAL_COMMUNITY)
Admission: EM | Admit: 2017-07-16 | Discharge: 2017-07-17 | Disposition: A | Discharge status: Home / Self Care | Payer: BLUE CROSS/BLUE SHIELD | Attending: Internal Medicine | Admitting: Internal Medicine

## 2017-07-16 ENCOUNTER — Ambulatory Visit (INDEPENDENT_AMBULATORY_CARE_PROVIDER_SITE_OTHER): Payer: BLUE CROSS/BLUE SHIELD | Admitting: Family Medicine

## 2017-07-16 VITALS — BP 128/80 | HR 95 | Temp 98.2°F | Resp 17 | Ht 71.5 in | Wt 204.0 lb

## 2017-07-16 DIAGNOSIS — E7800 Pure hypercholesterolemia, unspecified: Secondary | ICD-10-CM | POA: Diagnosis present

## 2017-07-16 DIAGNOSIS — E871 Hypo-osmolality and hyponatremia: Secondary | ICD-10-CM | POA: Diagnosis not present

## 2017-07-16 DIAGNOSIS — R739 Hyperglycemia, unspecified: Secondary | ICD-10-CM

## 2017-07-16 DIAGNOSIS — R351 Nocturia: Secondary | ICD-10-CM | POA: Diagnosis not present

## 2017-07-16 DIAGNOSIS — E1165 Type 2 diabetes mellitus with hyperglycemia: Secondary | ICD-10-CM | POA: Diagnosis not present

## 2017-07-16 DIAGNOSIS — H538 Other visual disturbances: Secondary | ICD-10-CM

## 2017-07-16 DIAGNOSIS — R7989 Other specified abnormal findings of blood chemistry: Secondary | ICD-10-CM

## 2017-07-16 DIAGNOSIS — E119 Type 2 diabetes mellitus without complications: Secondary | ICD-10-CM | POA: Diagnosis not present

## 2017-07-16 DIAGNOSIS — N529 Male erectile dysfunction, unspecified: Secondary | ICD-10-CM | POA: Insufficient documentation

## 2017-07-16 DIAGNOSIS — Z794 Long term (current) use of insulin: Secondary | ICD-10-CM | POA: Diagnosis not present

## 2017-07-16 DIAGNOSIS — I10 Essential (primary) hypertension: Secondary | ICD-10-CM | POA: Insufficient documentation

## 2017-07-16 DIAGNOSIS — Z79899 Other long term (current) drug therapy: Secondary | ICD-10-CM | POA: Insufficient documentation

## 2017-07-16 DIAGNOSIS — E781 Pure hyperglyceridemia: Secondary | ICD-10-CM

## 2017-07-16 DIAGNOSIS — Z9114 Patient's other noncompliance with medication regimen: Secondary | ICD-10-CM | POA: Diagnosis not present

## 2017-07-16 DIAGNOSIS — M109 Gout, unspecified: Secondary | ICD-10-CM | POA: Insufficient documentation

## 2017-07-16 DIAGNOSIS — N179 Acute kidney failure, unspecified: Secondary | ICD-10-CM | POA: Insufficient documentation

## 2017-07-16 HISTORY — DX: Type 2 diabetes mellitus without complications: E11.9

## 2017-07-16 LAB — BASIC METABOLIC PANEL
Anion gap: 18 — ABNORMAL HIGH (ref 5–15)
Anion gap: 14 (ref 5–15)
BUN: 28 mg/dL — AB (ref 6–20)
BUN: 22 mg/dL — ABNORMAL HIGH (ref 6–20)
CALCIUM: 9.1 mg/dL (ref 8.9–10.3)
CHLORIDE: 91 mmol/L — AB (ref 101–111)
CO2: 17 mmol/L — AB (ref 22–32)
CO2: 23 mmol/L (ref 22–32)
CREATININE: 1.46 mg/dL — AB (ref 0.61–1.24)
CREATININE: 1.16 mg/dL (ref 0.61–1.24)
Calcium: 9.5 mg/dL (ref 8.9–10.3)
Chloride: 95 mmol/L — ABNORMAL LOW (ref 101–111)
GFR calc Af Amer: >60 mL/min (ref >60)
GFR calc non Af Amer: 53 mL/min — ABNORMAL LOW (ref >60)
GFR calc non Af Amer: >60 mL/min (ref >60)
GLUCOSE: 489 mg/dL — AB (ref 65–99)
Glucose, Bld: 285 mg/dL — ABNORMAL HIGH (ref 65–99)
Potassium: 5.2 mmol/L — ABNORMAL HIGH (ref 3.5–5.1)
Potassium: 4.1 mmol/L (ref 3.5–5.1)
SODIUM: 126 mmol/L — AB (ref 135–145)
SODIUM: 132 mmol/L — AB (ref 135–145)

## 2017-07-16 LAB — URINALYSIS, ROUTINE W REFLEX MICROSCOPIC
Bacteria, UA: NONE SEEN
Bilirubin Urine: NEGATIVE
Glucose, UA: >=500 mg/dL — AB
Hgb urine dipstick: NEGATIVE
Ketones, ur: 20 mg/dL — AB
Leukocytes, UA: NEGATIVE
Nitrite: NEGATIVE
Protein, ur: NEGATIVE mg/dL
RBC / HPF: NONE SEEN RBC/hpf (ref 0–5)
SPECIFIC GRAVITY, URINE: 1.023 (ref 1.005–1.030)
pH: 5 (ref 5.0–8.0)

## 2017-07-16 LAB — CBC
HCT: 37.7 % — ABNORMAL LOW (ref 39.0–52.0)
Hemoglobin: 14.4 g/dL (ref 13.0–17.0)
MCH: 32.7 pg (ref 26.0–34.0)
MCHC: 38.2 g/dL — AB (ref 30.0–36.0)
MCV: 85.5 fL (ref 78.0–100.0)
PLATELETS: 288 10*3/uL (ref 150–400)
RBC: 4.41 MIL/uL (ref 4.22–5.81)
RDW: 11.6 % (ref 11.5–15.5)
WBC: 7.3 10*3/uL (ref 4.0–10.5)

## 2017-07-16 LAB — BLOOD GAS, VENOUS
Acid-base deficit: 0.9 mmol/L (ref 0.0–2.0)
Bicarbonate: 23.8 mmol/L (ref 20.0–28.0)
O2 Saturation: 65.5 %
PCO2 VEN: 41.7 mmHg — AB (ref 44.0–60.0)
PH VEN: 7.375 (ref 7.250–7.430)
PO2 VEN: 36.2 mmHg (ref 32.0–45.0)
Patient temperature: 98.6

## 2017-07-16 LAB — CBG MONITORING, ED
GLUCOSE-CAPILLARY: 536 mg/dL — AB (ref 65–99)
Glucose-Capillary: 378 mg/dL — ABNORMAL HIGH (ref 65–99)
Glucose-Capillary: 313 mg/dL — ABNORMAL HIGH (ref 65–99)

## 2017-07-16 LAB — LIPID PANEL
CHOLESTEROL: 447 mg/dL — AB (ref 0–200)
LDL Cholesterol: UNDETERMINED mg/dL (ref 0–99)
TRIGLYCERIDES: 3336 mg/dL — AB (ref <150)
VLDL: UNDETERMINED mg/dL (ref 0–40)

## 2017-07-16 LAB — POCT GLYCOSYLATED HEMOGLOBIN (HGB A1C): Hemoglobin A1C: 14

## 2017-07-16 LAB — GLUCOSE, POCT (MANUAL RESULT ENTRY)

## 2017-07-16 MED ORDER — ATORVASTATIN CALCIUM 40 MG PO TABS: 40.0000 mg | ORAL_TABLET | Freq: Every day | ORAL | Status: DC

## 2017-07-16 MED ORDER — DOCUSATE SODIUM 100 MG PO CAPS
100.0000 mg | ORAL_CAPSULE | Freq: Two times a day (BID) | ORAL | Status: DC
  Administered 2017-07-16 – 2017-07-17 (×2): 100 mg via ORAL
  Filled 2017-07-16 (×2): qty 1

## 2017-07-16 MED ORDER — ONDANSETRON HCL 4 MG PO TABS: 4.0000 mg | ORAL_TABLET | Freq: Four times a day (QID) | ORAL | Status: DC | PRN

## 2017-07-16 MED ORDER — SODIUM CHLORIDE 0.9 % IV SOLN: INTRAVENOUS | Status: DC

## 2017-07-16 MED ORDER — SODIUM CHLORIDE 0.9 % IV BOLUS (SEPSIS)
1000.0000 mL | Freq: Once | INTRAVENOUS | Status: AC
  Administered 2017-07-16: 1000 mL via INTRAVENOUS

## 2017-07-16 MED ORDER — INSULIN ASPART 100 UNIT/ML ~~LOC~~ SOLN
2.0000 [IU] | Freq: Once | SUBCUTANEOUS | Status: AC
Start: 2017-07-16 — End: 2017-07-16
  Administered 2017-07-16: 2 [IU] via SUBCUTANEOUS
  Filled 2017-07-16: qty 1

## 2017-07-16 MED ORDER — SODIUM CHLORIDE 0.9 % IV SOLN
INTRAVENOUS | Status: DC
  Administered 2017-07-16 – 2017-07-17 (×2): via INTRAVENOUS

## 2017-07-16 MED ORDER — ACETAMINOPHEN 650 MG RE SUPP: 650.0000 mg | Freq: Four times a day (QID) | RECTAL | Status: DC | PRN

## 2017-07-16 MED ORDER — ALLOPURINOL 100 MG PO TABS
200.0000 mg | ORAL_TABLET | Freq: Two times a day (BID) | ORAL | Status: DC
  Administered 2017-07-16 – 2017-07-17 (×2): 200 mg via ORAL
  Filled 2017-07-16 (×2): qty 2

## 2017-07-16 MED ORDER — METFORMIN HCL 1000 MG PO TABS: 1000.0000 mg | ORAL_TABLET | Freq: Two times a day (BID) | ORAL | 0 refills | Status: DC

## 2017-07-16 MED ORDER — ACETAMINOPHEN 325 MG PO TABS: 650.0000 mg | ORAL_TABLET | Freq: Four times a day (QID) | ORAL | Status: DC | PRN

## 2017-07-16 MED ORDER — INSULIN ASPART 100 UNIT/ML ~~LOC~~ SOLN
0.0000 [IU] | Freq: Every day | SUBCUTANEOUS | Status: DC
  Administered 2017-07-16: 3 [IU] via SUBCUTANEOUS

## 2017-07-16 MED ORDER — AMLODIPINE BESYLATE 5 MG PO TABS
5.0000 mg | ORAL_TABLET | Freq: Every day | ORAL | Status: DC
  Administered 2017-07-17: 5 mg via ORAL
  Filled 2017-07-16: qty 1

## 2017-07-16 MED ORDER — INSULIN ASPART 100 UNIT/ML ~~LOC~~ SOLN
0.0000 [IU] | Freq: Three times a day (TID) | SUBCUTANEOUS | Status: DC
  Administered 2017-07-17 (×2): 5 [IU] via SUBCUTANEOUS

## 2017-07-16 MED ORDER — DEXTROSE-NACL 5-0.45 % IV SOLN: INTRAVENOUS | Status: DC

## 2017-07-16 MED ORDER — ONDANSETRON HCL 4 MG/2ML IJ SOLN: 4.0000 mg | Freq: Four times a day (QID) | INTRAMUSCULAR | Status: DC | PRN

## 2017-07-16 MED ORDER — SODIUM CHLORIDE 0.9 % IV SOLN
INTRAVENOUS | Status: DC
  Filled 2017-07-16: qty 1

## 2017-07-16 NOTE — Patient Instructions (Addendum)
Unfortunately your blood sugar is too high to read in our office. That can be anywhere above 444.  Because of the increased blurry vision, and inability to read glucose level in office, you will need further evaluation through the emergency room today. You may need insulin, IV fluids, or other acute treatment. After treatment to the ER, please follow-up with me and we can continue with medication and further workup of diabetes.  Low-sodium was likely related to your high blood sugar. Kidney function was slightly elevated last time -  I will repeat that today.  Triglycerides were very high last visit. That is likely related to your elevated blood sugar, so we'll need to recheck those soon once her blood sugar starts to improve. In the meantime, sure you take your Lipitor every day, and if any abdominal pain, nausea, or vomiting, proceed to the emergency room as elevated triglycerides put you at risk for pancreatitis.   IF you received an x-ray today, you will receive an invoice from [facility]. Please contact [facility] at [phone] with questions or concerns regarding your invoice.   IF you received labwork today, you will receive an invoice from [place]. Please contact [facility] at [phone] with questions or concerns regarding your invoice.   Our billing staff will not be able to assist you with questions regarding bills from these companies.  You will be contacted with the lab results as soon as they are available. The fastest way to get your results is to activate your My Chart account. Instructions are located on the last page of this paperwork. If you have not heard from [place] regarding the results in 2 weeks, please contact this office.

## 2017-07-16 NOTE — ED Provider Notes (Signed)
[facility] DEPT Provider Note   CSN: [id number] Arrival date & time: 07/16/17  1212     History   Chief Complaint Chief Complaint  Patient presents with  . Hyperglycemia    HPI Guy Horton is a 56 y.o. male.  HPI 56 year old African-American male past medical history significant for hyperlipidemia, hypertension that presents to the emergency department today after being sent by his PCP for hyperglycemia. New-onset diabetes diagnosed today by PCP. Patient states in the last 2-3 weeks she's had increased polydipsia, polyphagia, polyuria along with 20 pounds weight loss over the past 2 weeks. Patient was seen by his PCP last week he had blood work drawn. PCP sent him to the ED for evaluation today. Hemoglobin A1c was 14. CBG was reading high on their monitor today. Patient denies any history of diabetes. Does report a significant family history of diabetes. 3 weeks ago patient's glucose was within normal limits. States he has been reducing his alcohol intake to less than 1. Day. He states he does report urinary frequently but denies any hematuria or dysuria.   Pt denies any fever, chill, ha, lightheadedness, dizziness, congestion, neck pain, cp, sob, cough, abd pain, n/v/d, urinary symptoms, change in bowel habits, melena, hematochezia, lower extremity paresthesias.  Past Medical History:  Diagnosis Date  . Gout   . Hyperlipemia   . Hypertension     Patient Active Problem List   Diagnosis Date Noted  . Hematoma of right thigh 09/13/2015  . Laceration of lower leg with complication 02/72/5366  . Non compliance w medication regimen 11/20/2014  . Gout 08/13/2012  . HTN (hypertension) 08/13/2012  . Erectile dysfunction 08/13/2012  . Hypercholesteremia 08/13/2012    Past Surgical History:  Procedure Laterality Date  . COLONOSCOPY    . I&D EXTREMITY Left 11/14/2016   Procedure: IRRIGATION AND DEBRIDEMENT LEFT INDEX FINGER;  Surgeon: [name], MD;  Location: [place];   Service: Orthopedics;  Laterality: Left;       Home Medications    Prior to Admission medications   Medication Sig Start Date End Date Taking? Authorizing Provider  allopurinol (ZYLOPRIM) 100 MG tablet TAKE 2 TABLETS BY MOUTH TWICE A DAY 07/09/17   [name], MD  amLODipine (NORVASC) 5 MG tablet Take 1 tablet (5 mg total) by mouth daily. 07/09/17   [name], MD  atorvastatin (LIPITOR) 40 MG tablet Take 1 tablet (40 mg total) by mouth daily. 07/09/17   [name], MD  docusate sodium (COLACE) 50 MG capsule Take 1 capsule (50 mg total) by mouth 2 (two) times daily. 07/08/17   Tasia Catchings, Amy V, [name]  polyethylene glycol (MIRALAX) packet Take 17 g by mouth daily. 07/08/17   Tasia Catchings, Amy V, [name]  telmisartan-hydrochlorothiazide (MICARDIS HCT) 80-12.5 MG tablet Take 1 tablet by mouth daily. 07/09/17   [name], MD    Family History Family History  Problem Relation Age of Onset  . Diabetes Mother     Social History Social History  Substance Use Topics  . Smoking status: Never Smoker  . Smokeless tobacco: Never Used  . Alcohol use No     Allergies   Patient has no known allergies.   Review of Systems Review of Systems  Constitutional: Positive for unexpected weight change. Negative for chills and fever.  HENT: Negative for congestion and sore throat.   Eyes: Positive for visual disturbance.  Respiratory: Negative for cough and shortness of breath.   Cardiovascular: Negative for chest pain.  Gastrointestinal:  Negative for abdominal pain, diarrhea, nausea and vomiting.  Endocrine: Positive for polydipsia, polyphagia and polyuria.  Genitourinary: Negative for dysuria, flank pain, frequency, hematuria, scrotal swelling, testicular pain and urgency.  Musculoskeletal: Negative for arthralgias and myalgias.  Skin: Negative for rash.  Neurological: Negative for dizziness, syncope, weakness, light-headedness, numbness and headaches.  Psychiatric/Behavioral: Negative  for sleep disturbance. The patient is not nervous/anxious.      Physical Exam Updated Vital Signs BP (!) 126/93 (BP Location: Left Arm)   Pulse 96   Temp 98.6 F (37 C) (Oral)   Resp 16   Ht 5\' 11"  (1.803 m)   Wt 93.4 kg (205 lb 12.8 oz)   SpO2 97%   BMI 28.70 kg/m   Physical Exam  Constitutional: He is oriented to person, place, and time. He appears well-developed and well-nourished.  Non-toxic appearance. No distress.  HENT:  Head: Normocephalic and atraumatic.  Mouth/Throat: Oropharynx is clear and moist.  Eyes: Pupils are equal, round, and reactive to light. Conjunctivae are normal. Right eye exhibits no discharge. Left eye exhibits no discharge.  Neck: Normal range of motion. Neck supple.  Cardiovascular: Normal rate, regular rhythm, normal heart sounds and intact distal pulses.  Exam reveals no gallop and no friction rub.   No murmur heard. Pulmonary/Chest: Effort normal and breath sounds normal. No respiratory distress. He has no wheezes. He has no rales. He exhibits no tenderness.  Abdominal: Soft. Bowel sounds are normal. He exhibits no distension. There is no tenderness. There is no rebound and no guarding.  Musculoskeletal: Normal range of motion. He exhibits no tenderness.  Lymphadenopathy:    He has no cervical adenopathy.  Neurological: He is alert and oriented to person, place, and time.  Skin: Skin is warm and dry. Capillary refill takes less than 2 seconds. No rash noted.  Psychiatric: His behavior is normal. Judgment and thought content normal.  Nursing note and vitals reviewed.    ED Treatments / Results  Labs (all labs ordered are listed, but only abnormal results are displayed) Labs Reviewed  BASIC METABOLIC PANEL - Abnormal; Notable for the following:       Result Value   Sodium 126 (*)    Potassium 5.2 (*)    Chloride 91 (*)    CO2 17 (*)    Glucose, Bld 489 (*)    BUN 28 (*)    Creatinine, Ser 1.46 (*)    GFR calc non Af Amer 53 (*)    Anion  gap 18 (*)    All other components within normal limits  CBC - Abnormal; Notable for the following:    HCT 37.7 (*)    MCHC 38.2 (*)    All other components within normal limits  URINALYSIS, ROUTINE W REFLEX MICROSCOPIC - Abnormal; Notable for the following:    Color, Urine STRAW (*)    Glucose, UA >=500 (*)    Ketones, ur 20 (*)    Squamous Epithelial / LPF 0-5 (*)    All other components within normal limits  BLOOD GAS, VENOUS - Abnormal; Notable for the following:    pCO2, Ven 41.7 (*)    All other components within normal limits  LIPID PANEL - Abnormal; Notable for the following:    Cholesterol 447 (*)    Triglycerides 3,336 (*)    All other components within normal limits  BASIC METABOLIC PANEL - Abnormal; Notable for the following:    Sodium 132 (*)    Chloride 95 (*)  Glucose, Bld 285 (*)    BUN 22 (*)    All other components within normal limits  CBG MONITORING, ED - Abnormal; Notable for the following:    Glucose-Capillary 536 (*)    All other components within normal limits  CBG MONITORING, ED - Abnormal; Notable for the following:    Glucose-Capillary 378 (*)    All other components within normal limits  CBG MONITORING, ED - Abnormal; Notable for the following:    Glucose-Capillary 313 (*)    All other components within normal limits  HIV ANTIBODY (ROUTINE TESTING)  BASIC METABOLIC PANEL  CBC    EKG  EKG Interpretation None       Radiology No results found.  Procedures Procedures (including critical care time)  Medications Ordered in ED Medications  sodium chloride 0.9 % bolus 1,000 mL (1,000 mLs Intravenous New Bag/Given 07/16/17 1342)     Initial Impression / Assessment and Plan / ED Course  I have reviewed the triage vital signs and the nursing notes.  Pertinent labs & imaging results that were available during my care of the patient were reviewed by me and considered in my medical decision making (see chart for details).     Patient  presents to the ED after being referred by PCP for new onset diabetes with blood sugars in the 500s. Patient with associated symptoms of polyuria, polydipsia, polyphagia. Reports 20 pound weight loss over the past 2 weeks. Urine with 20 ketones but no other signs of infection. Blood sugar initially the 500s in the ED. Patient given 2 L of fluid boluses and 2 units of insulin with improvement of his blood sugars to 313.  Blood work resulted that shows no leukocytosis. Creatinine mildly elevated at 1.46. Anion gap is 18. The pH is normal. Suspect DKA however patient has no associated symptoms of abdominal pain, nausea, emesis. Given patient's labwork and new-onset diabetes feel the patient would benefit from hospital admission. This was discussed with attending provider who is agreeable. Patient is hemodynamically stable at this time. He was updated complaining the care is agreeable to admission.  Spoke with Dr. Lorin Mercy with hospital medicine who agrees to admit patient to come to the ED to evaluate patient placement shortness.  Final Clinical Impressions(s) / ED Diagnoses   Final diagnoses:  Hyperglycemia    New Prescriptions Current Discharge Medication List    START taking these medications   Details  metFORMIN (GLUCOPHAGE) 1000 MG tablet Take 1 tablet (1,000 mg total) by mouth 2 (two) times daily with a meal. Qty: 60 tablet, Refills: 0         Aaron Edelman 07/16/17 2342    Tanna Furry, MD 07/18/17 [phone]

## 2017-07-16 NOTE — Progress Notes (Signed)
Subjective:  By signing my name below, I, [name], attest that this documentation has been prepared under the direction and in the presence of [name], MD. Electronically Signed: [name], [place]. 07/16/2017 , 10:35 AM .  Patient was seen in Room 11 .   Patient ID: Guy Horton, male    DOB: 1961-10-26, 56 y.o.   MRN: [id number] Chief Complaint  Patient presents with  . Follow-up    lab results    HPI Guy Horton is a 56 y.o. male Here for follow up of recent physical, noted to have significant hyperglycemia. He had a biscuit this morning prior to coming into office.   Hyperglycemia - labwork His glucose was 486, sodium 132 and CO2 normal at 20. He did note increased thirst with 20 lb weight loss prior 2 weeks. However, he was also drinking 4 beers a night. His mother had diabetes. He was fasting last visit.   Patient states he's been feeling normal. At night, he's drinking more Gatorade and water. He's cut back on his alcohol intake to less than a beer a day. He's also been urinating frequently; denies hematuria and dysuria. He denies nausea, vomiting, or abdominal pain. He also informs blurry vision ongoing for a few weeks now. It's gotten a little worse since last visit.   Elevated creatinine He has history of HTN and hyperglycemia as above.   Lab Results  Component Value Date   CREATININE 1.33 (H) 07/09/2017   This was increased from 1.19 in Feb.   Hypertriglyceridemia  Lab Results  Component Value Date   CHOL 373 (H) 07/09/2017   HDL 29 (L) 07/09/2017   LDLCALC Comment 07/09/2017   TRIG 1,470 (HH) 07/09/2017   CHOLHDL 12.9 (H) 07/09/2017   His triglycerides significantly increased from 154 in Feb, thought to be due to hyperglycemia.   Patient Active Problem List   Diagnosis Date Noted  . Hematoma of right thigh 09/13/2015  . Laceration of lower leg with complication 21/22/4825  . Non compliance w medication regimen 11/20/2014  . Gout  08/13/2012  . HTN (hypertension) 08/13/2012  . Erectile dysfunction 08/13/2012  . Hypercholesteremia 08/13/2012   Past Medical History:  Diagnosis Date  . Gout   . Hyperlipemia   . Hypertension    Past Surgical History:  Procedure Laterality Date  . COLONOSCOPY    . I&D EXTREMITY Left 11/14/2016   Procedure: IRRIGATION AND DEBRIDEMENT LEFT INDEX FINGER;  Surgeon: [name], MD;  Location: [place];  Service: Orthopedics;  Laterality: Left;   No Known Allergies Prior to Admission medications   Medication Sig Start Date End Date Taking? Authorizing Provider  allopurinol (ZYLOPRIM) 100 MG tablet TAKE 2 TABLETS BY MOUTH TWICE A DAY 07/09/17  Yes [name], MD  amLODipine (NORVASC) 5 MG tablet Take 1 tablet (5 mg total) by mouth daily. 07/09/17  Yes [name], MD  atorvastatin (LIPITOR) 40 MG tablet Take 1 tablet (40 mg total) by mouth daily. 07/09/17  Yes [name], MD  docusate sodium (COLACE) 50 MG capsule Take 1 capsule (50 mg total) by mouth 2 (two) times daily. 07/08/17  Yes Yu, Amy V, [name]  polyethylene glycol (MIRALAX) packet Take 17 g by mouth daily. 07/08/17  Yes Yu, Amy V, [name]  telmisartan-hydrochlorothiazide (MICARDIS HCT) 80-12.5 MG tablet Take 1 tablet by mouth daily. 07/09/17  Yes [name], MD   Social History   Social History  . Marital status: Married  Spouse name: N/A  . Number of children: N/A  . Years of education: N/A   Occupational History  . Not on file.   Social History Main Topics  . Smoking status: Never Smoker  . Smokeless tobacco: Never Used  . Alcohol use No  . Drug use: No  . Sexual activity: No   Other Topics Concern  . Not on file   Social History Narrative  . No narrative on file   Review of Systems  Constitutional: Negative for fatigue and unexpected weight change.  Eyes: Positive for visual disturbance.  Respiratory: Negative for cough, chest tightness and shortness of breath.   Cardiovascular: Negative  for chest pain, palpitations and leg swelling.  Gastrointestinal: Negative for abdominal pain, blood in stool, nausea and vomiting.  Endocrine: Positive for polydipsia and polyuria.  Genitourinary: Positive for frequency. Negative for dysuria and hematuria.  Neurological: Negative for dizziness, light-headedness and headaches.       Objective:   Physical Exam  Constitutional: He is oriented to person, place, and time. He appears well-developed and well-nourished.  HENT:  Head: Normocephalic and atraumatic.  Eyes: Pupils are equal, round, and reactive to light. EOM are normal.  Neck: No JVD present. Carotid bruit is not present.  Cardiovascular: Normal rate, regular rhythm and normal heart sounds.   No murmur heard. Pulmonary/Chest: Effort normal and breath sounds normal. He has no rales.  Musculoskeletal: He exhibits no edema.  Neurological: He is alert and oriented to person, place, and time.  Skin: Skin is warm and dry.  Psychiatric: He has a normal mood and affect.  Vitals reviewed.   Vitals:   07/16/17 1012  BP: 128/80  Pulse: 95  Resp: 17  Temp: 98.2 F (36.8 C)  TempSrc: Oral  SpO2: 98%  Weight: 204 lb (92.5 kg)  Height: 5' 11.5" (1.816 m)   Results for orders placed or performed in visit on 07/16/17  POCT glucose (manual entry)  Result Value Ref Range   POC Glucose HHH 70 - 99 mg/dl  POCT glycosylated hemoglobin (Hb A1C)  Result Value Ref Range   Hemoglobin A1C 14.0        Assessment & Plan:   Guy Horton is a 56 y.o. male Hyperglycemia - Plan: POCT glucose (manual entry), POCT glycosylated hemoglobin (Hb A1C)  Elevated serum creatinine - Plan: Basic metabolic panel  Hypertriglyceridemia  Hyponatremia  Blurred vision  Nocturia  New diagnosis of diabetes, with A1c greater than 14, unfortunately glucose too high to read in our office. He was 486 when checked one week ago, but has had some increased blurry vision, persistent nocturia every 2 hours  a night - potentially could have higher reading today. Bicarbonate was normal one week ago. Denies nausea/vomiting/abdominal pain.  - Sent by private vehicle to [facility] emergency room for further evaluation including glucose reading, potentially IV fluids and insulin, and determination of inpatient versus outpatient treatment. Advised to follow-up with me after emergency room treatment  -Hyponatremia likely due to elevated glucose. Should correct as glucose improves.  - Hypertriglyceridemia likely related to hyperglycemia. He is compliant with atorvastatin. recheck as improves glucose readings, risk of pancreatitis discussed with persistent elevations.   - Will advise charge nurse at [facility] ER.  No orders of the defined types were placed in this encounter.  Patient Instructions   Unfortunately your blood sugar is too high to read in our office. That can be anywhere above 444.  Because of the increased blurry vision,  and inability to read glucose level in office, you will need further evaluation through the emergency room today. You may need insulin, IV fluids, or other acute treatment. After treatment to the ER, please follow-up with me and we can continue with medication and further workup of diabetes.  Low-sodium was likely related to your high blood sugar. Kidney function was slightly elevated last time -  I will repeat that today.  Triglycerides were very high last visit. That is likely related to your elevated blood sugar, so we'll need to recheck those soon once her blood sugar starts to improve. In the meantime, sure you take your Lipitor every day, and if any abdominal pain, nausea, or vomiting, proceed to the emergency room as elevated triglycerides put you at risk for pancreatitis.   IF you received an x-ray today, you will receive an invoice from [facility]. Please contact [facility] at [phone] with questions or concerns regarding your invoice.     IF you received labwork today, you will receive an invoice from [place]. Please contact [facility] at [phone] with questions or concerns regarding your invoice.   Our billing staff will not be able to assist you with questions regarding bills from these companies.  You will be contacted with the lab results as soon as they are available. The fastest way to get your results is to activate your My Chart account. Instructions are located on the last page of this paperwork. If you have not heard from [place] regarding the results in 2 weeks, please contact this office.       I personally performed the services described in this documentation, which was scribed in my presence. The recorded information has been reviewed and considered for accuracy and completeness, addended by me as needed, and agree with information above.  Signed,   [name], MD [facility] [place].  07/16/17 11:03 AM

## 2017-07-16 NOTE — ED Triage Notes (Signed)
Pt sent by PCP for new onset hyperglycemia, new diagnosis of DM today. Blood sugars were normal when tested 3 months ago. Pt reports blurred vision, polydipsia, polyphagia, weight loss onset 2 weeks ago.

## 2017-07-16 NOTE — H&P (Signed)
History and Physical    Guy Horton SWH:[id number] DOB: Nov 15, 1961 DOA: 07/16/2017  PCP: Guy Ray, MD Consultants:  None Patient coming from: Home - lives with wife, daughter, and granddaughter; Guy Horton: wife, [phone]  Chief Complaint: hyperglycemia  HPI: Guy Horton is a 56 y.o. male with medical history significant of HTN, HLD, and gout presenting as a referral from Primary Care at [facility] for hyperglycemia.  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  He has lost so much weight - he weighed 232 pounds 6 months ago and maybe 225 pounds before starting to work in the plant and he is 205 pounds today.  He got real dehydrated one day and his body and legs started cramping up.  He knew he wasn't drinking enough water.  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  Then his vision got blurry and he looked on You Tube and saw that it might be related to blood sugar being high.  He went to PCP last week.  Tests came back and the glucose was high so he was sent to [facility].  Denies polydipsia, but drinking "a lot of Gatorade and water to get my system back right".  +polyuria, "every hour on the hour".    He does report mild neuropathy along the right great toe, but this is the ankle that had skin grafting on the medial side and he also had a wound to the anterior shin that required prolonged healing.  ED Course: Given 2L IVF and 2 units of SQ insulin with improvement in anion gap, CO2  Review of Systems: As per HPI; otherwise review of systems reviewed and negative.   Ambulatory Status:  Ambulates without assistance  Past Medical History:  Diagnosis Date  . Diabetes ([place])   . Gout   . Hyperlipemia   . Hypertension     Past Surgical History:  Procedure Laterality Date  . COLONOSCOPY    . I&D EXTREMITY Left 11/14/2016   Procedure: IRRIGATION AND DEBRIDEMENT  LEFT INDEX FINGER;  Surgeon: [name], MD;  Location: [facility];  Service: Orthopedics;  Laterality: Left;    Social History   Social History  . Marital status: Married    Spouse name: N/A  . Number of children: N/A  . Years of education: N/A   Occupational History  . forklift driver    Social History Main Topics  . Smoking status: Never Smoker  . Smokeless tobacco: Never Used  . Alcohol use Yes     Comment: 3-4 beers daily  . Drug use: No  . Sexual activity: No   Other Topics Concern  . Not on file   Social History Narrative  . No narrative on file    No Known Allergies  Family History  Problem Relation Age of Onset  . Diabetes Mother   . Other Father 108       Work accident  . Prostate cancer Father     Prior to Admission medications   Medication Sig Start Date End Date Taking? Authorizing Provider  allopurinol (ZYLOPRIM) 100 MG tablet TAKE 2 TABLETS BY MOUTH TWICE A DAY 07/09/17  Yes Guy Agreste, MD  amLODipine (NORVASC) 5 MG tablet Take 1 tablet (5 mg total) by mouth daily. 07/09/17  Yes Guy Agreste, MD  atorvastatin (LIPITOR) 40 MG tablet  Take 1 tablet (40 mg total) by mouth daily. 07/09/17  Yes Guy Agreste, MD  docusate sodium (COLACE) 50 MG capsule Take 1 capsule (50 mg total) by mouth 2 (two) times daily. 07/08/17  Yes Yu, Amy V, [name]  telmisartan-hydrochlorothiazide (MICARDIS HCT) 80-12.5 MG tablet Take 1 tablet by mouth daily. 07/09/17  Yes Guy Agreste, MD  metFORMIN (GLUCOPHAGE) 1000 MG tablet Take 1 tablet (1,000 mg total) by mouth 2 (two) times daily with a meal. 07/16/17   Leaphart, Zack Seal, [name]  polyethylene glycol (MIRALAX) packet Take 17 g by mouth daily. Patient not taking: Reported on 07/16/2017 07/08/17   Ok Edwards, [name]    Physical Exam: Vitals:   07/16/17 1700 07/16/17 1730 07/16/17 1800 07/16/17 2017  BP: 135/89 (!) 128/95 (!) 143/95 140/83  Pulse: 79 83 70 70  Resp:   16 14  Temp:      TempSrc:      SpO2: 94% 100% 97%  99%  Weight:      Height:         General:  Appears calm and comfortable and is NAD Eyes:  PERRL, EOMI, normal lids, iris ENT:  grossly normal hearing, lips & tongue, mmm Neck:  no LAD, masses or thyromegaly Cardiovascular:  RRR, no m/r/g. No LE edema.  Respiratory:  CTA bilaterally, no w/r/r. Normal respiratory effort. Abdomen:  soft, ntnd, NABS Skin:  no rash or induration seen on limited exam.  He does have evidence of prior skin grafting on his right medial ankle and scattered healed injuries on his bilateral shins. Musculoskeletal:  grossly normal tone BUE/BLE, good ROM, no bony abnormality.  Feet with 2+ distal pulses and no apparent ulcerations.   Psychiatric:  grossly normal mood and affect, speech fluent and appropriate, AOx3 Neurologic:  CN 2-12 grossly intact, moves all extremities in coordinated fashion, sensation intact  Labs on Admission: I have personally reviewed following labs and imaging studies  CBC:  Recent Labs Lab 07/16/17 1338  WBC 7.3  HGB 14.4  HCT 37.7*  MCV 85.5  PLT 836   Basic Metabolic Panel:  Recent Labs Lab 07/16/17 1338 07/16/17 1859  NA 126* 132*  K 5.2* 4.1  CL 91* 95*  CO2 17* 23  GLUCOSE 489* 285*  BUN 28* 22*  CREATININE 1.46* 1.16  CALCIUM 9.5 9.1   GFR: Estimated Creatinine Clearance: 85.9 mL/min (by C-G formula based on SCr of 1.16 mg/dL). Liver Function Tests: No results for input(s): AST, ALT, ALKPHOS, BILITOT, PROT, ALBUMIN in the last 168 hours. No results for input(s): LIPASE, AMYLASE in the last 168 hours. No results for input(s): AMMONIA in the last 168 hours. Coagulation Profile: No results for input(s): INR, PROTIME in the last 168 hours. Cardiac Enzymes: No results for input(s): CKTOTAL, CKMB, CKMBINDEX, TROPONINI in the last 168 hours. BNP (last 3 results) No results for input(s): PROBNP in the last 8760 hours. HbA1C:  Recent Labs  07/16/17 1051  HGBA1C 14.0   CBG:  Recent Labs Lab 07/16/17 1224  07/16/17 1445 07/16/17 1652  GLUCAP 536* 378* 313*   Lipid Profile:  Recent Labs  07/16/17 1338  CHOL 447*  HDL NOT REPORTED DUE TO HIGH TRIGLYCERIDES  LDLCALC UNABLE TO CALCULATE IF TRIGLYCERIDE OVER 400 mg/dL  TRIG 3,336*  CHOLHDL NOT REPORTED DUE TO HIGH TRIGLYCERIDES   Thyroid Function Tests: No results for input(s): TSH, T4TOTAL, FREET4, T3FREE, THYROIDAB in the last 72 hours. Anemia Panel: No results for input(s): VITAMINB12, FOLATE, FERRITIN, TIBC,  IRON, RETICCTPCT in the last 72 hours. Urine analysis:    Component Value Date/Time   COLORURINE STRAW (A) 07/16/2017 1259   APPEARANCEUR CLEAR 07/16/2017 1259   LABSPEC 1.023 07/16/2017 1259   PHURINE 5.0 07/16/2017 1259   GLUCOSEU >=500 (A) 07/16/2017 1259   HGBUR NEGATIVE 07/16/2017 1259   BILIRUBINUR NEGATIVE 07/16/2017 1259   KETONESUR 20 (A) 07/16/2017 1259   PROTEINUR NEGATIVE 07/16/2017 1259   NITRITE NEGATIVE 07/16/2017 1259   LEUKOCYTESUR NEGATIVE 07/16/2017 1259    Creatinine Clearance: Estimated Creatinine Clearance: 85.9 mL/min (by C-G formula based on SCr of 1.16 mg/dL).  Sepsis Labs: @LABRCNTIP (procalcitonin:4,lacticidven:4) )No results found for this or any previous visit (from the past 240 hour(s)).   Radiological Exams on Admission: No results found.  EKG: not done  Assessment/Plan Principal Problem:   New onset type 2 diabetes mellitus ([place]) Active Problems:   Gout   HTN (hypertension)   Hypercholesteremia   New onset DM -Initial concern was for DKA but patient responded appropriately to IVF and minimal SQ insulin -VBG: 7.375/42/24 -Initial BMP 1338: Na++ 126; K+ 3.2; Cl 91; CO2 17; Glucose 489; BUN 28/Creatinine 1.46/GFR >60; gap 18 -Repeat BMP at 1859: Na++ 132; K+ 4.1; Cl 95; CO2 23; Glucose 285; BUN 22/Creatinine 1.16/GFR >60; Anion gap 14 -Based on his rapid improvement and his apparent type 2 DM, patient does not require Glucostabilizer protocol or monitoring in SDU -Will observe  overnight on med surg -Coverage with SSI, sensitive scale -A1c is 14 -He may need long-term insulin but it would be reasonable to start PO therapy initially and monitor for response -Will request diabetic educator to see patient -Will continue IVF overnight - he had AKI on arrival that has improved already with hydration -He does report some possible neuropathy in his right great toe, which may be associated with DM or could simply be related to old injuries.  He will need good foot care.  HTN -Continue Norvasc -Hold Telmisartan-HCTZ overnight due to AKI, but this can likely be resumed tomorrow  HLD -Lipids from 8/7: TC 447, TG 3336  -Most of the TG issue is likely related to his previously-undiagnosed DM -Suggest repeat once glucose is controlled -For now continue Lipitor 40 mg  Gout -Continue Allopurinol -As noted in Dr. Vonna Kotyk note, consider discontinuation of HCTZ   DVT prophylaxis: Early ambulation Code Status:  Full - confirmed with patient Family Communication: None present Disposition Plan:  Home once clinically improved Consults called: Diabetic educator; CM Admission status: It is my clinical opinion that referral for OBSERVATION is reasonable and necessary in this patient based on the above information provided. The aforementioned taken together are felt to place the patient at high risk for further clinical deterioration. However it is anticipated that the patient may be medically stable for discharge from the hospital within 24 to 48 hours.    Karmen Bongo MD [facility]  If 7PM-7AM, please contact night-coverage [email or web address] Password [email or web address]  07/16/2017, 10:16 PM

## 2017-07-17 DIAGNOSIS — R739 Hyperglycemia, unspecified: Secondary | ICD-10-CM | POA: Diagnosis not present

## 2017-07-17 DIAGNOSIS — E7800 Pure hypercholesterolemia, unspecified: Secondary | ICD-10-CM

## 2017-07-17 DIAGNOSIS — I10 Essential (primary) hypertension: Secondary | ICD-10-CM | POA: Diagnosis not present

## 2017-07-17 DIAGNOSIS — E119 Type 2 diabetes mellitus without complications: Secondary | ICD-10-CM

## 2017-07-17 LAB — GLUCOSE, CAPILLARY
GLUCOSE-CAPILLARY: 291 mg/dL — AB (ref 65–99)
Glucose-Capillary: 282 mg/dL — ABNORMAL HIGH (ref 65–99)
Glucose-Capillary: 292 mg/dL — ABNORMAL HIGH (ref 65–99)

## 2017-07-17 LAB — BASIC METABOLIC PANEL
ANION GAP: 11 (ref 5–15)
BUN/Creatinine Ratio: 18 (ref 9–20)
BUN: 26 mg/dL — AB (ref 6–24)
BUN: 17 mg/dL (ref 6–20)
CALCIUM: 9.5 mg/dL (ref 8.7–10.2)
CALCIUM: 8.5 mg/dL — AB (ref 8.9–10.3)
CO2: 18 mmol/L — ABNORMAL LOW (ref 20–29)
CO2: 21 mmol/L — AB (ref 22–32)
CREATININE: 1 mg/dL (ref 0.61–1.24)
Chloride: 84 mmol/L — ABNORMAL LOW (ref 96–106)
Chloride: 98 mmol/L — ABNORMAL LOW (ref 101–111)
Creatinine, Ser: 1.47 mg/dL — ABNORMAL HIGH (ref 0.76–1.27)
GFR calc Af Amer: >60 mL/min (ref >60)
GFR calc non Af Amer: >60 mL/min (ref >60)
GFR, EST AFRICAN AMERICAN: 62 mL/min/{1.73_m2} (ref >59)
GFR, EST NON AFRICAN AMERICAN: 54 mL/min/{1.73_m2} — AB (ref >59)
GLUCOSE: 299 mg/dL — AB (ref 65–99)
Glucose: 570 mg/dL (ref 65–99)
Potassium: 4.9 mmol/L (ref 3.5–5.2)
Potassium: 4 mmol/L (ref 3.5–5.1)
Sodium: 123 mmol/L — ABNORMAL LOW (ref 134–144)
Sodium: 130 mmol/L — ABNORMAL LOW (ref 135–145)

## 2017-07-17 LAB — CBC
HCT: 35.8 % — ABNORMAL LOW (ref 39.0–52.0)
HEMOGLOBIN: 13 g/dL (ref 13.0–17.0)
MCH: 31.4 pg (ref 26.0–34.0)
MCHC: 36.3 g/dL — AB (ref 30.0–36.0)
MCV: 86.5 fL (ref 78.0–100.0)
Platelets: 250 10*3/uL (ref 150–400)
RBC: 4.14 MIL/uL — ABNORMAL LOW (ref 4.22–5.81)
RDW: 11.9 % (ref 11.5–15.5)
WBC: 6.1 10*3/uL (ref 4.0–10.5)

## 2017-07-17 MED ORDER — GLIPIZIDE 5 MG PO TABS: 5.0000 mg | ORAL_TABLET | Freq: Two times a day (BID) | ORAL | 2 refills | Status: DC

## 2017-07-17 MED ORDER — METFORMIN HCL 500 MG PO TABS
500.0000 mg | ORAL_TABLET | Freq: Two times a day (BID) | ORAL | Status: DC
  Administered 2017-07-17: 500 mg via ORAL
  Filled 2017-07-17: qty 1

## 2017-07-17 MED ORDER — LIVING WELL WITH DIABETES BOOK
Freq: Once | Status: AC
  Administered 2017-07-17: 14:00:00
  Filled 2017-07-17: qty 1

## 2017-07-17 MED ORDER — GLUCOSE BLOOD VI STRP: ORAL_STRIP | 2 refills | Status: DC

## 2017-07-17 MED ORDER — TRUE METRIX METER DEVI: 1.0000 | Freq: Four times a day (QID) | 0 refills | Status: AC

## 2017-07-17 MED ORDER — TRUEPLUS LANCETS 28G MISC: 1.0000 | Freq: Four times a day (QID) | 2 refills | Status: AC

## 2017-07-17 MED ORDER — METFORMIN HCL 500 MG PO TABS: 1000.0000 mg | ORAL_TABLET | Freq: Two times a day (BID) | ORAL | 2 refills | Status: DC

## 2017-07-17 MED ORDER — INSULIN GLARGINE 100 UNIT/ML ~~LOC~~ SOLN
10.0000 [IU] | Freq: Every day | SUBCUTANEOUS | Status: DC
  Administered 2017-07-17: 10 [IU] via SUBCUTANEOUS
  Filled 2017-07-17: qty 0.1

## 2017-07-17 NOTE — Progress Notes (Signed)
Nutrition Brief Note  Patient identified on the Malnutrition Screening Tool (MST) Report  Wt Readings from Last 15 Encounters:  07/16/17 200 lb (90.7 kg)  07/16/17 204 lb (92.5 kg)  07/09/17 200 lb (90.7 kg)  01/14/17 235 lb 6.4 oz (106.8 kg)  11/03/16 232 lb (105.2 kg)  12/13/15 221 lb (100.2 kg)  10/11/15 232 lb 12.8 oz (105.6 kg)  09/27/15 222 lb (100.7 kg)  09/13/15 221 lb (100.2 kg)  09/05/15 216 lb 5.8 oz (98.1 kg)  02/17/15 230 lb (104.3 kg)  11/20/14 221 lb (100.2 kg)  05/14/13 231 lb 12.8 oz (105.1 kg)  08/13/12 225 lb (102.1 kg)  03/18/12 219 lb (99.3 kg)    Body mass index is 27.89 kg/m. Patient meets criteria for overweight  based on current BMI.   Current diet order is carbohydrate controlled, patient is consuming approximately 75-100% of meals at this time. Labs and medications reviewed.   No nutrition interventions warranted at this time. If nutrition issues arise, please consult RD.   Koleen Distance MS, RD, LDN Pager #- [phone] After Hours Pager: [phone]

## 2017-07-17 NOTE — Discharge Summary (Signed)
Physician Discharge Summary  STARLIN STEIB KYH:[id number] DOB: [birth year] DOA: 07/16/2017  PCP: Darreld Mclean, MD  Admit date: 07/16/2017 Discharge date: 07/17/2017  Admitted From: home Disposition:  home  Recommendations for Outpatient Follow-up:  1. Follow up with PCP in 1-2 weeks 2. Patient started on metformin and glipizide on discharge for new diagnosis of diabetes mellitus.  Patient declines use of insulin at home at this point   Home Health: None Equipment/Devices: None  Discharge Condition: Stable CODE STATUS: Full code Diet recommendation: Diabetic   HPI: Per Dr. Lorin Mercy, Guy Horton is a 56 y.o. male with medical history significant of HTN, HLD, and gout presenting as a referral from Primary Care at [facility] for hyperglycemia.  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  He has lost so much weight - he weighed 232 pounds 6 months ago and maybe 225 pounds before starting to work in the plant and he is 205 pounds today.  He got real dehydrated one day and his body and legs started cramping up.  He knew he wasn't drinking enough water.  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  Then his vision got blurry and he looked on You Tube and saw that it might be related to blood sugar being high.  He went to PCP last week.  Tests came back and the glucose was high so he was sent to [facility].  Denies polydipsia, but drinking "a lot of Gatorade and water to get my system back right".  +polyuria, "every hour on the hour".  He does report mild neuropathy along the right great toe, but this is the ankle that had skin grafting on the medial side and he also had a wound to the anterior shin that required prolonged healing.  Hospital Course: Discharge Diagnoses:  Principal Problem:   New onset type 2 diabetes mellitus ([place]) Active Problems:   Gout   HTN  (hypertension)   Hypercholesteremia   New-onset diabetes mellitus - initial concern for true DKA with the patient having an anion gap on initial labs, however with fluids alone and subcutaneous insulin his bicarb has improved and his gap is resolved and thus he was not started on a insulin infusion.  Patient with poor p.o. intake in the last few days, I wonder whether poor nutrition contributed to his mild acidosis.  He has also used large amounts of Gatorade prior to admission which can also destabilize his admitting CBGs.  He received sliding scale insulin, his labs have remained fairly good, he has no abdominal pain, nausea or vomiting and was able to tolerate a regular diet.  Diabetes educator was consulted and have discussed with the patient.  He received teaching from the diabetes educator as well as the nursing staff regarding home CBG  checks as well as insulin injection.  His hemoglobin A1c was 14, and I recommended that patient goes home on insulin.  Patient however refused to use insulin at home, and would like to try p.o. meds for now and he will follow-up with his PCP within the next week and based on his own CBGs to determine whether he truly needs insulin at home.  He was placed on metformin as well as glipizide, extensively counseled for CBG checks 4 times daily and maintain a log.  Of note, patient somewhat  disengaged with poor eye contact on my interaction Hypertension -resume home medications Hyperlipidemia /hypertriglyceridemia-with high triglycerides likely due to undiagnosed diabetes mellitus. Continue Lipitor.  He is at risk for pancreatitis and will need quick CBG control.  Unfortunately he does not want to use insulin at this point Gout -continue allopurinol   Discharge Instructions  Discharge Instructions    Ambulatory referral to Nutrition and Diabetic Education    Complete by:  As directed    New-onset DM. HgbA1C - 14%     Allergies as of 07/17/2017   No Known Allergies      Medication List    TAKE these medications   allopurinol 100 MG tablet Commonly known as:  ZYLOPRIM TAKE 2 TABLETS BY MOUTH TWICE A DAY   amLODipine 5 MG tablet Commonly known as:  NORVASC Take 1 tablet (5 mg total) by mouth daily.   atorvastatin 40 MG tablet Commonly known as:  LIPITOR Take 1 tablet (40 mg total) by mouth daily.   docusate sodium 50 MG capsule Commonly known as:  COLACE Take 1 capsule (50 mg total) by mouth 2 (two) times daily.   glipiZIDE 5 MG tablet Commonly known as:  GLUCOTROL Take 1 tablet (5 mg total) by mouth 2 (two) times daily.   glucose blood test strip Commonly known as:  TRUE METRIX BLOOD GLUCOSE TEST Use as instructed   metFORMIN 1000 MG tablet Commonly known as:  GLUCOPHAGE Take 1 tablet (1,000 mg total) by mouth 2 (two) times daily with a meal.   metFORMIN 500 MG tablet Commonly known as:  GLUCOPHAGE Take 2 tablets (1,000 mg total) by mouth 2 (two) times daily with a meal. Take 1 tablet twice daily for a week then 2 tablets twice daily   telmisartan-hydrochlorothiazide 80-12.5 MG tablet Commonly known as:  MICARDIS HCT Take 1 tablet by mouth daily.   TRUE METRIX METER Devi 1 Device by Does not apply route 4 (four) times daily.   TRUEPLUS LANCETS 28G Misc 1 each by Does not apply route 4 (four) times daily.      Follow-up Information    [facility] Follow up.   Why:  Call tomorrow to schedule an appointment [facility] Address: [address], [place], [place] [address] Phone: [phone]         Copland, Gay Filler, MD. Schedule an appointment as soon as possible for a visit in 1 week(s).   Specialty:  Family Medicine Contact information: [place] [address] [phone]          No Known Allergies  Consultations:  None   Procedures/Studies:  None   No results  found.   Subjective: - no chest pain, shortness of breath, no abdominal pain, nausea or vomiting.   Discharge Exam: Vitals:   07/17/17 0515 07/17/17 1422  BP: 134/77 (!) 145/95  Pulse: 66 86  Resp: 18 18  Temp: 98.4 F (36.9 C) (!) 97.4 F (36.3 C)   Vitals:   07/16/17 2241 07/16/17 2300 07/17/17 0515 07/17/17 1422  BP: (!) 159/96  134/77 (!) 145/95  Pulse: 64  66 86  Resp: 14  18 18   Temp:   98.4 F (36.9 C) (!) 97.4 F (36.3 C)  TempSrc: Oral  Oral Oral  SpO2: 100%  100% 98%  Weight:  90.7 kg (200 lb)    Height:  5\' 11"  (1.803 m)  General: Pt is alert, awake, not in acute distress Cardiovascular: RRR, S1/S2 +, no rubs, no gallops Respiratory: CTA bilaterally, no wheezing, no rhonchi Abdominal: Soft, NT, ND, bowel sounds + Extremities: no edema, no cyanosis    The results of significant diagnostics from this hospitalization (including imaging, microbiology, ancillary and laboratory) are listed below for reference.     Microbiology: No results found for this or any previous visit (from the past 240 hour(s)).   Labs: BNP (last 3 results) No results for input(s): BNP in the last 8760 hours. Basic Metabolic Panel:  Recent Labs Lab 07/16/17 1036 07/16/17 1338 07/16/17 1859 07/17/17 0738  NA 123* 126* 132* 130*  K 4.9 5.2* 4.1 4.0  CL 84* 91* 95* 98*  CO2 18* 17* 23 21*  GLUCOSE 570* 489* 285* 299*  BUN 26* 28* 22* 17  CREATININE 1.47* 1.46* 1.16 1.00  CALCIUM 9.5 9.5 9.1 8.5*   Liver Function Tests: No results for input(s): AST, ALT, ALKPHOS, BILITOT, PROT, ALBUMIN in the last 168 hours. No results for input(s): LIPASE, AMYLASE in the last 168 hours. No results for input(s): AMMONIA in the last 168 hours. CBC:  Recent Labs Lab 07/16/17 1338 07/17/17 0738  WBC 7.3 6.1  HGB 14.4 13.0  HCT 37.7* 35.8*  MCV 85.5 86.5  PLT 288 250   Cardiac Enzymes: No results for input(s): CKTOTAL, CKMB, CKMBINDEX, TROPONINI in the last 168  hours. BNP: Invalid input(s): POCBNP CBG:  Recent Labs Lab 07/16/17 1445 07/16/17 1652 07/16/17 2240 07/17/17 0727 07/17/17 1144  GLUCAP 378* 313* 282* 291* 292*   D-Dimer No results for input(s): DDIMER in the last 72 hours. Hgb A1c  Recent Labs  07/16/17 1051  HGBA1C 14.0   Lipid Profile  Recent Labs  07/16/17 1338  CHOL 447*  HDL NOT REPORTED DUE TO HIGH TRIGLYCERIDES  LDLCALC UNABLE TO CALCULATE IF TRIGLYCERIDE OVER 400 mg/dL  TRIG 3,336*  CHOLHDL NOT REPORTED DUE TO HIGH TRIGLYCERIDES   Thyroid function studies No results for input(s): TSH, T4TOTAL, T3FREE, THYROIDAB in the last 72 hours.  Invalid input(s): FREET3 Anemia work up No results for input(s): VITAMINB12, FOLATE, FERRITIN, TIBC, IRON, RETICCTPCT in the last 72 hours. Urinalysis    Component Value Date/Time   COLORURINE STRAW (A) 07/16/2017 1259   APPEARANCEUR CLEAR 07/16/2017 1259   LABSPEC 1.023 07/16/2017 1259   PHURINE 5.0 07/16/2017 1259   GLUCOSEU >=500 (A) 07/16/2017 1259   HGBUR NEGATIVE 07/16/2017 1259   BILIRUBINUR NEGATIVE 07/16/2017 1259   KETONESUR 20 (A) 07/16/2017 1259   PROTEINUR NEGATIVE 07/16/2017 1259   NITRITE NEGATIVE 07/16/2017 1259   LEUKOCYTESUR NEGATIVE 07/16/2017 1259   Sepsis Labs Invalid input(s): PROCALCITONIN,  WBC,  LACTICIDVEN Microbiology No results found for this or any previous visit (from the past 240 hour(s)).   Time coordinating discharge: 40 minutes  SIGNED:  Marzetta Board, MD  [facility] 07/17/2017, 5:44 PM Pager [phone]  If 7PM-7AM, please contact night-coverage [email or web address] Password [email or web address]

## 2017-07-17 NOTE — Care Management Note (Signed)
Case Management Note  Patient Details  Name: Guy Horton MRN: [id number] Date of Birth: Jul 28, 1961  Subjective/Objective:   hyperglycemia                 Action/Plan: Date:  July 17, 2017 Chart reviewed for concurrent status and case management needs. Will continue to follow patient progress. Discharge Planning: following for needs Expected discharge date: [id number] Velva Harman, BSN, [place], [place]  Expected Discharge Date:   (unknown)               Expected Discharge Plan:  Home/Self Care  In-House Referral:     Discharge planning Services  CM Consult  Post Acute Care Choice:    Choice offered to:     DME Arranged:    DME Agency:     HH Arranged:    [place] Agency:     Status of Service:  In process, will continue to follow  If discussed at Long Length of Stay Meetings, dates discussed:    Additional Comments:  Leeroy Cha, RN 07/17/2017, 8:41 AM

## 2017-07-17 NOTE — Progress Notes (Signed)
Date: July 16, 2017 Chart reviewed for discharge orders: None found for case management. Soni Kegel,BSN,RN3, CCM/[phone] 

## 2017-07-17 NOTE — Discharge Instructions (Signed)
Please start taking the metformin twice a day. Avoid carbohydrates and sugars. He need close follow-up with her diabetic management Center and your primary care doctor. It is also important for you to follow up with a eye doctor. If he develops any worsening symptoms including severe nausea or vomiting or abdominal pain return to the ED.  Accuchecks 4 times/day, Once in AM empty stomach and then before each meal. Log in all results and show them to your primary doctor in 3-5 days. If any glucose reading is under 80 or above 300 call your primary doctor immediately.  Follow with Copland, Gay Filler, MD in 5-7 days  Please get a complete blood count and chemistry panel checked by your Primary MD at your next visit, and again as instructed by your Primary MD. Please get your medications reviewed and adjusted by your Primary MD.  Please request your Primary MD to go over all Hospital Tests and Procedure/Radiological results at the follow up, please get all Hospital records sent to your Prim MD by signing hospital release before you go home.  If you had Pneumonia of Lung problems at the Hospital: Please get a 2 view Chest X ray done in 6-8 weeks after hospital discharge or sooner if instructed by your Primary MD.  If you have Congestive Heart Failure: Please call your Cardiologist or Primary MD anytime you have any of the following symptoms:  1) 3 pound weight gain in 24 hours or 5 pounds in 1 week  2) shortness of breath, with or without a dry hacking cough  3) swelling in the hands, feet or stomach  4) if you have to sleep on extra pillows at night in order to breathe  Follow cardiac low salt diet and 1.5 lit/day fluid restriction.  If you have diabetes Accuchecks 4 times/day, Once in AM empty stomach and then before each meal. Log in all results and show them to your primary doctor at your next visit. If any glucose reading is under 80 or above 300 call your primary MD immediately.  If you  have Seizure/Convulsions/Epilepsy: Please do not drive, operate heavy machinery, participate in activities at heights or participate in high speed sports until you have seen by Primary MD or a Neurologist and advised to do so again.  If you had Gastrointestinal Bleeding: Please ask your Primary MD to check a complete blood count within one week of discharge or at your next visit. Your endoscopic/colonoscopic biopsies that are pending at the time of discharge, will also need to followed by your Primary MD.  Get Medicines reviewed and adjusted. Please take all your medications with you for your next visit with your Primary MD  Please request your Primary MD to go over all hospital tests and procedure/radiological results at the follow up, please ask your Primary MD to get all Hospital records sent to his/her office.  If you experience worsening of your admission symptoms, develop shortness of breath, life threatening emergency, suicidal or homicidal thoughts you must seek medical attention immediately by calling 911 or calling your MD immediately  if symptoms less severe.  You must read complete instructions/literature along with all the possible adverse reactions/side effects for all the Medicines you take and that have been prescribed to you. Take any new Medicines after you have completely understood and accpet all the possible adverse reactions/side effects.   Do not drive or operate heavy machinery when taking Pain medications.   Do not take more than prescribed Pain, Sleep and  Anxiety Medications  Special Instructions: If you have smoked or chewed Tobacco  in the last 2 yrs please stop smoking, stop any regular Alcohol  and or any Recreational drug use.  Wear Seat belts while driving.  Please note You were cared for by a hospitalist during your hospital stay. If you have any questions about your discharge medications or the care you received while you were in the hospital after you are  discharged, you can call the unit and asked to speak with the hospitalist on call if the hospitalist that took care of you is not available. Once you are discharged, your primary care physician will handle any further medical issues. Please note that NO REFILLS for any discharge medications will be authorized once you are discharged, as it is imperative that you return to your primary care physician (or establish a relationship with a primary care physician if you do not have one) for your aftercare needs so that they can reassess your need for medications and monitor your lab values.  You can reach the hospitalist office at phone [phone] or fax [phone]   If you do not have a primary care physician, you can call [phone] for a physician referral.  Activity: As tolerated with Full fall precautions use walker/cane & assistance as needed  Diet: diabetic  Disposition Home

## 2017-07-17 NOTE — Progress Notes (Signed)
Pt taught and given handout on how to check blood sugars and what is a low or high blood sugar reading along with how to inject insulin if needed later on. Pts IV removed with and clean and dry dressing intact. Pt denies pain at the time of discharge with no s/s of distress noted. Pt ambulated to the front entrance with nursing staff present.

## 2017-07-17 NOTE — Progress Notes (Signed)
Nutrition Education Note   RD consulted for nutrition education regarding diabetes.   Lab Results  Component Value Date   HGBA1C 14.0 07/16/2017    RD provided "Nutrition and Type II Diabetes" handout from the Academy of Nutrition and Dietetics. Discussed different food groups and their effects on blood sugar, emphasizing carbohydrate-containing foods. Provided list of carbohydrates and recommended serving sizes of common foods.  Discussed importance of controlled and consistent carbohydrate intake throughout the day. Provided examples of ways to balance meals/snacks and encouraged intake of high-fiber, whole grain complex carbohydrates. Teach back method used.  Expect good compliance.  Body mass index is 27.89 kg/m. Pt meets criteria for overweight based on current BMI.  Current diet order is carbohydrate controlled, patient is consuming approximately 75-100% of meals at this time. Labs and medications reviewed. No further nutrition interventions warranted at this time. RD contact information provided. If additional nutrition issues arise, please re-consult RD.  Koleen Distance MS, RD, LDN Pager #- [phone] After Hours Pager: [phone]

## 2017-07-17 NOTE — Progress Notes (Signed)
Inpatient Diabetes Program Recommendations  AACE/ADA: New Consensus Statement on Inpatient Glycemic Control (2015)  Target Ranges:  Prepandial:   less than 140 mg/dL      Peak postprandial:   less than 180 mg/dL (1-2 hours)      Critically ill patients:  140 - 180 mg/dL   Lab Results  Component Value Date   GLUCAP 292 (H) 07/17/2017   HGBA1C 14.0 07/16/2017    Review of Glycemic Control  Diabetes history: New-onset Outpatient Diabetes medications: None Current orders for Inpatient glycemic control: Lantus 10 units bid, metformin 500 mg bid, Novolog 0-9 units tidwc  HgbA1C - 14%.  Spoke with pt at length regarding new diagnosis of DM.   Discussed A1C results (14%) and explained what an A1C is and informed patient that his current A1C indicates an average glucose of 355 mg/dl over the past 2-3 months. Discussed basic pathophysiology of DM Type 2, basic home care, importance of checking CBGs and maintaining good CBG control to prevent long-term and short-term complications.Reviewed signs and symptoms of hyperglycemia and hypoglycemia along with treatment for both. Discussed impact of nutrition, exercise, stress, sickness, and medications on diabetes control. Ordered Living Well with diabetes book and encouraged patient to read. Will order OP Diabetes Education for newly-diagnosed DM.   Pt to f/u with PCP at [facility] and take blood sugar log for MD to review.  Discussed with RN.  Thank you. Lorenda Peck, RD, LDN, CDE Inpatient Diabetes Coordinator [phone]

## 2017-07-18 ENCOUNTER — Telehealth: Payer: Self-pay | Admitting: Behavioral Health

## 2017-07-18 LAB — HIV ANTIBODY (ROUTINE TESTING W REFLEX): HIV SCREEN 4TH GENERATION: NONREACTIVE

## 2017-07-18 NOTE — Telephone Encounter (Signed)
Patient voiced that he will have a follow-up visit with Dr. Carlota Raspberry at the primary care office in [place].

## 2017-07-20 ENCOUNTER — Encounter: Payer: Self-pay | Admitting: Family Medicine

## 2017-07-20 ENCOUNTER — Ambulatory Visit (INDEPENDENT_AMBULATORY_CARE_PROVIDER_SITE_OTHER): Payer: BLUE CROSS/BLUE SHIELD | Admitting: Family Medicine

## 2017-07-20 VITALS — BP 128/81 | HR 71 | Temp 98.4°F | Resp 16 | Ht 69.75 in | Wt 203.2 lb

## 2017-07-20 DIAGNOSIS — E781 Pure hyperglyceridemia: Secondary | ICD-10-CM | POA: Diagnosis not present

## 2017-07-20 DIAGNOSIS — E1165 Type 2 diabetes mellitus with hyperglycemia: Secondary | ICD-10-CM

## 2017-07-20 DIAGNOSIS — I10 Essential (primary) hypertension: Secondary | ICD-10-CM | POA: Diagnosis not present

## 2017-07-20 LAB — GLUCOSE, POCT (MANUAL RESULT ENTRY): POC Glucose: 271 mg/dl — AB (ref 70–99)

## 2017-07-20 NOTE — Patient Instructions (Addendum)
Blood sugar is still elevated at 271 here today. Continue glipizide twice per day, metformin twice per day, continue to work on diet recommendations that were discussed in the hospital. You can increase glipizide to 10 mg or 2 pills before your largest meal, remain at 5 mg dose at other meal.  If blood sugars are not less than 200 in the next 3-4 days, you can increase to 10 mg glipizide for both meals. Follow-up with me in 10 days, sooner if worse.  We can check other lab work next visit including your triglycerides, but if any abdominal pain, nausea, vomiting, or new symptoms return here or the emergency room.  Diabetes Mellitus and Standards of Medical Care Managing diabetes (diabetes mellitus) can be complicated. Your diabetes treatment may be managed by a team of health care providers, including:  A diet and nutrition specialist (registered dietitian).  A nurse.  A certified diabetes educator (CDE).  A diabetes specialist (endocrinologist).  An eye doctor.  A primary care provider.  A dentist.  Your health care providers follow a schedule in order to help you get the best quality of care. The following schedule is a general guideline for your diabetes management plan. Your health care providers may also give you more specific instructions. HbA1c ( hemoglobin A1c) test This test provides information about blood sugar (glucose) control over the previous 2-3 months. It is used to check whether your diabetes management plan needs to be adjusted.  If you are meeting your treatment goals, this test is done at least 2 times a year.  If you are not meeting treatment goals or if your treatment goals have changed, this test is done 4 times a year.  Blood pressure test  This test is done at every routine medical visit. For most people, the goal is less than 130/80. Ask your health care provider what your goal blood pressure should be. Dental and eye exams  Visit your dentist two times  a year.  If you have type 1 diabetes, get an eye exam 3-5 years after you are diagnosed, and then once a year after your first exam. ? If you were diagnosed with type 1 diabetes as a child, get an eye exam when you are age 65 or older and have had diabetes for 3-5 years. After the first exam, you should get an eye exam once a year.  If you have type 2 diabetes, have an eye exam as soon as you are diagnosed, and then once a year after your first exam. Foot care exam  Visual foot exams are done at every routine medical visit. The exams check for cuts, bruises, redness, blisters, sores, or other problems with the feet.  A complete foot exam is done by your health care provider once a year. This exam includes an inspection of the structure and skin of your feet, and a check of the pulses and sensation in your feet. ? Type 1 diabetes: Get your first exam 3-5 years after diagnosis. ? Type 2 diabetes: Get your first exam as soon as you are diagnosed.  Check your feet every day for cuts, bruises, redness, blisters, or sores. If you have any of these or other problems that are not healing, contact your health care provider. Kidney function test ( urine microalbumin)  This test is done once a year. ? Type 1 diabetes: Get your first test 5 years after diagnosis. ? Type 2 diabetes: Get your first test as soon as you are diagnosed.  If you have chronic kidney disease (CKD), get a serum creatinine and estimated glomerular filtration rate (eGFR) test once a year. Lipid profile (cholesterol, HDL, LDL, triglycerides)  This test should be done when you are diagnosed with diabetes, and every 5 years after the first test. If you are on medicines to lower your cholesterol, you may need to get this test done every year. ? The goal for LDL is less than 100 mg/dL (5.5 mmol/L). If you are at high risk, the goal is less than 70 mg/dL (3.9 mmol/L). ? The goal for HDL is 40 mg/dL (2.2 mmol/L) for men and 50 mg/dL(2.8  mmol/L) for women. An HDL cholesterol of 60 mg/dL (3.3 mmol/L) or higher gives some protection against heart disease. ? The goal for triglycerides is less than 150 mg/dL (8.3 mmol/L). Immunizations  The yearly flu (influenza) vaccine is recommended for everyone 6 months or older who has diabetes.  The pneumonia (pneumococcal) vaccine is recommended for everyone 2 years or older who has diabetes. If you are 72 or older, you may get the pneumonia vaccine as a series of two separate shots.  The hepatitis B vaccine is recommended for adults shortly after they have been diagnosed with diabetes.  The Tdap (tetanus, diphtheria, and pertussis) vaccine should be given: ? According to normal childhood vaccination schedules, for children. ? Every 10 years, for adults who have diabetes.  The shingles vaccine is recommended for people who have had chicken pox and are 50 years or older. Mental and emotional health  Screening for symptoms of eating disorders, anxiety, and depression is recommended at the time of diagnosis and afterward as needed. If your screening shows that you have symptoms (you have a positive screening result), you may need further evaluation and be referred to a mental health care provider. Diabetes self-management education  Education about how to manage your diabetes is recommended at diagnosis and ongoing as needed. Treatment plan  Your treatment plan will be reviewed at every medical visit. Summary  Managing diabetes (diabetes mellitus) can be complicated. Your diabetes treatment may be managed by a team of health care providers.  Your health care providers follow a schedule in order to help you get the best quality of care.  Standards of care including having regular physical exams, blood tests, blood pressure monitoring, immunizations, screening tests, and education about how to manage your diabetes.  Your health care providers may also give you more specific instructions  based on your individual health. This information is not intended to replace advice given to you by your health care provider. Make sure you discuss any questions you have with your health care provider. Document Released: 09/23/2009 Document Revised: 08/24/2016 Document Reviewed: 08/24/2016 Elsevier Interactive Patient Education  2018 [employer].   IF you received an x-ray today, you will receive an invoice from [facility]. Please contact [facility] at [phone] with questions or concerns regarding your invoice.   IF you received labwork today, you will receive an invoice from [facility]. Please contact [facility] at [phone] with questions or concerns regarding your invoice.   Our billing staff will not be able to assist you with questions regarding bills from these companies.  You will be contacted with the lab results as soon as they are available. The fastest way to get your results is to activate your My Chart account. Instructions are located on the last page of this paperwork. If you have not heard from [place] regarding the results in 2 weeks, please  contact this office.

## 2017-07-20 NOTE — Progress Notes (Signed)
Subjective:    Patient ID: Guy Horton, male    DOB: 04-08-1961, 56 y.o.   MRN: [id number]  HPI Guy Horton is a 56 y.o. male  Here for hospital follow-up for new diagnosis of diabetes.  Diabetes  Admitted/obs 8/7-8/8.  I last saw him August 7 for recheck glucose has initial glucose 486 on July 31 labs. He did endorse some weight loss, increased thirst. Office visit August 7, too high to read in office. Sent to emergency room for evaluation. He was admitted on August 7, actually 2 L of IV fluid and 2 units of subcutaneous insulin with improvement of anion gap in the ER. There was initial concern for DKA with initial anion gap, but with fluids alone Improved, so not started on insulin infusion. Thought to be some diet component including large amounts of Gatorade prior to hospital admission. He did receive sliding scale insulin, labs remained overall stable, no abdominal pain, nausea, vomiting. Diabetic educator was consulted including home blood sugar checks. He was started on metformin 1000 mg twice a day and glipizide 5 mg BID, and advised to check blood sugars 4 times per day. Insulin was discussed, but he wanted to avoid taking insulin.  Discharge reading 290.  Watching diet, avoiding sugars, other rec's from diabetic educator. No missed doses of metformin, or glipizide. No lows. Home readings about 280-285. No recent thirst issues or blurry vision. No abd pain/n/v. Has only been on oral meds for past 2 days.   optho - last visit 6 months ago.  Dentist - 3-4 months ago.    Lab Results  Component Value Date   HGBA1C 14.0 07/16/2017  No results found for: MICROALBUR, [id number]   Hypertriglyceridemia Talk to be due to elevated glucose. Was continued on Lipitor. Risk for pancreatitis discussed by myself as well as in hospital. Lab Results  Component Value Date   CHOL 447 (H) 07/16/2017   HDL NOT REPORTED DUE TO HIGH TRIGLYCERIDES 07/16/2017   LDLCALC UNABLE TO CALCULATE IF  TRIGLYCERIDE OVER 400 mg/dL 07/16/2017   TRIG 3,336 (H) 07/16/2017   CHOLHDL NOT REPORTED DUE TO HIGH TRIGLYCERIDES 07/16/2017   Lab Results  Component Value Date   ALT 18 07/09/2017   AST 16 07/09/2017   ALKPHOS 87 07/09/2017   BILITOT 0.7 07/09/2017    Gout He was continued on allopurinol. Have discussed possible holding HCTZ with gout especially if flares. Last gout flare over a year ago.   Hypertension He takes Norvasc and Micardis HCT as below. No new side effects.  Lab Results  Component Value Date   CREATININE 1.00 07/17/2017    Patient Active Problem List   Diagnosis Date Noted  . New onset type 2 diabetes mellitus ([place]) 07/16/2017  . Hematoma of right thigh 09/13/2015  . Laceration of lower leg with complication 54/56/2563  . Non compliance w medication regimen 11/20/2014  . Gout 08/13/2012  . HTN (hypertension) 08/13/2012  . Erectile dysfunction 08/13/2012  . Hypercholesteremia 08/13/2012   Past Medical History:  Diagnosis Date  . Diabetes ([place])   . Gout   . Hyperlipemia   . Hypertension    Past Surgical History:  Procedure Laterality Date  . COLONOSCOPY    . I&D EXTREMITY Left 11/14/2016   Procedure: IRRIGATION AND DEBRIDEMENT LEFT INDEX FINGER;  Surgeon: [name], MD;  Location: [place];  Service: Orthopedics;  Laterality: Left;   No Known Allergies Prior to Admission medications   Medication Sig Start Date End Date  Taking? Authorizing Provider  allopurinol (ZYLOPRIM) 100 MG tablet TAKE 2 TABLETS BY MOUTH TWICE A DAY 07/09/17  Yes [name], MD  amLODipine (NORVASC) 5 MG tablet Take 1 tablet (5 mg total) by mouth daily. 07/09/17  Yes [name], MD  atorvastatin (LIPITOR) 40 MG tablet Take 1 tablet (40 mg total) by mouth daily. 07/09/17  Yes [name], MD  Blood Glucose Monitoring Suppl (ONETOUCH VERIO) w/Device KIT  07/18/17  Yes [provider]  Blood Glucose Monitoring Suppl (TRUE METRIX METER) DEVI 1 Device by Does not  apply route 4 (four) times daily. 07/17/17  Yes Gherghe, Vella Redhead, MD  docusate sodium (COLACE) 50 MG capsule Take 1 capsule (50 mg total) by mouth 2 (two) times daily. 07/08/17  Yes Yu, Amy V, [name]  glipiZIDE (GLUCOTROL) 5 MG tablet Take 1 tablet (5 mg total) by mouth 2 (two) times daily. 07/17/17 07/17/18 Yes Gherghe, Vella Redhead, MD  glucose blood (TRUE METRIX BLOOD GLUCOSE TEST) test strip Use as instructed 07/17/17  Yes [name], MD  metFORMIN (GLUCOPHAGE) 1000 MG tablet Take 1 tablet (1,000 mg total) by mouth 2 (two) times daily with a meal. 07/16/17  Yes Leaphart, Zack Seal, [name]  metFORMIN (GLUCOPHAGE) 500 MG tablet Take 2 tablets (1,000 mg total) by mouth 2 (two) times daily with a meal. Take 1 tablet twice daily for a week then 2 tablets twice daily 07/17/17  Yes Gherghe, Vella Redhead, MD  telmisartan-hydrochlorothiazide (MICARDIS HCT) 80-12.5 MG tablet Take 1 tablet by mouth daily. 07/09/17  Yes [name], MD  TRUEPLUS LANCETS 28G MISC 1 each by Does not apply route 4 (four) times daily. 07/17/17  Yes [name], MD   Social History   Social History  . Marital status: Married    Spouse name: N/A  . Number of children: N/A  . Years of education: N/A   Occupational History  . forklift driver    Social History Main Topics  . Smoking status: Never Smoker  . Smokeless tobacco: Never Used  . Alcohol use Yes     Comment: 3-4 beers daily  . Drug use: No  . Sexual activity: No   Other Topics Concern  . Not on file   Social History Narrative  . No narrative on file    Review of Systems  Constitutional: Negative for fever.  Gastrointestinal: Negative for abdominal pain, nausea and vomiting.   Other per HPI     Objective:   Physical Exam  Constitutional: He is oriented to person, place, and time. He appears well-developed and well-nourished.  HENT:  Head: Normocephalic and atraumatic.  Eyes: Pupils are equal, round, and reactive to light. EOM are normal.  Neck: No JVD  present. Carotid bruit is not present.  Cardiovascular: Normal rate, regular rhythm, normal heart sounds and intact distal pulses.   No murmur heard. Pulmonary/Chest: Effort normal and breath sounds normal. He has no rales.  Abdominal: Soft. There is no tenderness.  Musculoskeletal: He exhibits no edema.  Neurological: He is alert and oriented to person, place, and time.  Skin: Skin is warm, dry and intact. No rash noted.  Psychiatric: He has a normal mood and affect. His behavior is normal.  Vitals reviewed.  Vitals:   07/20/17 1400  BP: 128/81  Pulse: 71  Resp: 16  Temp: 98.4 F (36.9 C)  TempSrc: Oral  SpO2: 97%  Weight: 203 lb 4 oz (92.2 kg)  Height: 5' 9.75" (1.772 m)  Results for orders placed or performed in visit on 07/20/17  POCT glucose (manual entry)  Result Value Ref Range   POC Glucose 271 (A) 70 - 99 mg/dl      Assessment & Plan:    Guy Horton is a 56 y.o. male Type 2 diabetes mellitus with hyperglycemia, without long-term current use of insulin ([place]) - Plan: POCT glucose (manual entry), Microalbumin, urine  - New diagnosis. Now on metformin and glipizide twice a day. Still with persistent elevations in the 200s. Discussed potential insulin, will try continuing oral medications at this time.   - Increase glipizide to 10 mg with largest meal, continue 5 mg at other meal for now, continue metformin same dose for now.  - If persistent hyperglycemia above 200, can increase glipizide to 10 mg twice a day  Hypoglycemic precautions discussed, recheck in 10 days.  -diabetes and standards of care handout given, check urine microalbumin, recommended optho eval now that he has diabetes. Recheck 10 days  Hypertriglyceridemia  - likely due to hyperglycemia. Pancreatitis symptoms/precautions were discussed. Recheck lipids once glucose of better range. Potentially next visit.  Essential hypertension  - Stable, no med changes. Denies recent gout attack, will continue  same regimen including HCTZ for now.   Meds ordered this encounter  Medications  . Blood Glucose Monitoring Suppl (ONETOUCH VERIO) w/Device KIT   Patient Instructions    Blood sugar is still elevated at 271 here today. Continue glipizide twice per day, metformin twice per day, continue to work on diet recommendations that were discussed in the hospital. You can increase glipizide to 10 mg or 2 pills before your largest meal, remain at 5 mg dose at other meal.  If blood sugars are not less than 200 in the next 3-4 days, you can increase to 10 mg glipizide for both meals. Follow-up with me in 10 days, sooner if worse.  We can check other lab work next visit including your triglycerides, but if any abdominal pain, nausea, vomiting, or new symptoms return here or the emergency room.  Diabetes Mellitus and Standards of Medical Care Managing diabetes (diabetes mellitus) can be complicated. Your diabetes treatment may be managed by a team of health care providers, including:  A diet and nutrition specialist (registered dietitian).  A nurse.  A certified diabetes educator (CDE).  A diabetes specialist (endocrinologist).  An eye doctor.  A primary care provider.  A dentist.  Your health care providers follow a schedule in order to help you get the best quality of care. The following schedule is a general guideline for your diabetes management plan. Your health care providers may also give you more specific instructions. HbA1c ( hemoglobin A1c) test This test provides information about blood sugar (glucose) control over the previous 2-3 months. It is used to check whether your diabetes management plan needs to be adjusted.  If you are meeting your treatment goals, this test is done at least 2 times a year.  If you are not meeting treatment goals or if your treatment goals have changed, this test is done 4 times a year.  Blood pressure test  This test is done at every routine medical  visit. For most people, the goal is less than 130/80. Ask your health care provider what your goal blood pressure should be. Dental and eye exams  Visit your dentist two times a year.  If you have type 1 diabetes, get an eye exam 3-5 years after you are diagnosed, and then once  a year after your first exam. ? If you were diagnosed with type 1 diabetes as a child, get an eye exam when you are age 57 or older and have had diabetes for 3-5 years. After the first exam, you should get an eye exam once a year.  If you have type 2 diabetes, have an eye exam as soon as you are diagnosed, and then once a year after your first exam. Foot care exam  Visual foot exams are done at every routine medical visit. The exams check for cuts, bruises, redness, blisters, sores, or other problems with the feet.  A complete foot exam is done by your health care provider once a year. This exam includes an inspection of the structure and skin of your feet, and a check of the pulses and sensation in your feet. ? Type 1 diabetes: Get your first exam 3-5 years after diagnosis. ? Type 2 diabetes: Get your first exam as soon as you are diagnosed.  Check your feet every day for cuts, bruises, redness, blisters, or sores. If you have any of these or other problems that are not healing, contact your health care provider. Kidney function test ( urine microalbumin)  This test is done once a year. ? Type 1 diabetes: Get your first test 5 years after diagnosis. ? Type 2 diabetes: Get your first test as soon as you are diagnosed.  If you have chronic kidney disease (CKD), get a serum creatinine and estimated glomerular filtration rate (eGFR) test once a year. Lipid profile (cholesterol, HDL, LDL, triglycerides)  This test should be done when you are diagnosed with diabetes, and every 5 years after the first test. If you are on medicines to lower your cholesterol, you may need to get this test done every year. ? The goal for  LDL is less than 100 mg/dL (5.5 mmol/L). If you are at high risk, the goal is less than 70 mg/dL (3.9 mmol/L). ? The goal for HDL is 40 mg/dL (2.2 mmol/L) for men and 50 mg/dL(2.8 mmol/L) for women. An HDL cholesterol of 60 mg/dL (3.3 mmol/L) or higher gives some protection against heart disease. ? The goal for triglycerides is less than 150 mg/dL (8.3 mmol/L). Immunizations  The yearly flu (influenza) vaccine is recommended for everyone 6 months or older who has diabetes.  The pneumonia (pneumococcal) vaccine is recommended for everyone 2 years or older who has diabetes. If you are 28 or older, you may get the pneumonia vaccine as a series of two separate shots.  The hepatitis B vaccine is recommended for adults shortly after they have been diagnosed with diabetes.  The Tdap (tetanus, diphtheria, and pertussis) vaccine should be given: ? According to normal childhood vaccination schedules, for children. ? Every 10 years, for adults who have diabetes.  The shingles vaccine is recommended for people who have had chicken pox and are 50 years or older. Mental and emotional health  Screening for symptoms of eating disorders, anxiety, and depression is recommended at the time of diagnosis and afterward as needed. If your screening shows that you have symptoms (you have a positive screening result), you may need further evaluation and be referred to a mental health care provider. Diabetes self-management education  Education about how to manage your diabetes is recommended at diagnosis and ongoing as needed. Treatment plan  Your treatment plan will be reviewed at every medical visit. Summary  Managing diabetes (diabetes mellitus) can be complicated. Your diabetes treatment may be managed by  a team of health care providers.  Your health care providers follow a schedule in order to help you get the best quality of care.  Standards of care including having regular physical exams, blood tests,  blood pressure monitoring, immunizations, screening tests, and education about how to manage your diabetes.  Your health care providers may also give you more specific instructions based on your individual health. This information is not intended to replace advice given to you by your health care provider. Make sure you discuss any questions you have with your health care provider. Document Released: 09/23/2009 Document Revised: 08/24/2016 Document Reviewed: 08/24/2016 Elsevier Interactive Patient Education  2018 [employer].   IF you received an x-ray today, you will receive an invoice from [facility]. Please contact [facility] at [phone] with questions or concerns regarding your invoice.   IF you received labwork today, you will receive an invoice from [place]. Please contact [facility] at [phone] with questions or concerns regarding your invoice.   Our billing staff will not be able to assist you with questions regarding bills from these companies.  You will be contacted with the lab results as soon as they are available. The fastest way to get your results is to activate your My Chart account. Instructions are located on the last page of this paperwork. If you have not heard from [place] regarding the results in 2 weeks, please contact this office.      I personally performed the services described in this documentation, which was scribed in my presence. The recorded information has been reviewed and considered for accuracy and completeness, addended by me as needed, and agree with information above.  Signed,   [name], MD [facility].  07/20/17 3:05 PM

## 2017-07-21 LAB — MICROALBUMIN, URINE: MICROALBUM., U, RANDOM: 29.6 ug/mL

## 2017-07-31 ENCOUNTER — Ambulatory Visit (INDEPENDENT_AMBULATORY_CARE_PROVIDER_SITE_OTHER): Payer: BLUE CROSS/BLUE SHIELD | Admitting: Family Medicine

## 2017-07-31 ENCOUNTER — Encounter: Payer: Self-pay | Admitting: Family Medicine

## 2017-07-31 VITALS — BP 142/84 | HR 71 | Temp 97.9°F | Resp 16 | Ht 71.0 in | Wt 209.0 lb

## 2017-07-31 DIAGNOSIS — H538 Other visual disturbances: Secondary | ICD-10-CM

## 2017-07-31 DIAGNOSIS — E7800 Pure hypercholesterolemia, unspecified: Secondary | ICD-10-CM | POA: Diagnosis not present

## 2017-07-31 DIAGNOSIS — E119 Type 2 diabetes mellitus without complications: Secondary | ICD-10-CM

## 2017-07-31 DIAGNOSIS — E781 Pure hyperglyceridemia: Secondary | ICD-10-CM | POA: Diagnosis not present

## 2017-07-31 LAB — GLUCOSE, POCT (MANUAL RESULT ENTRY): POC GLUCOSE: 114 mg/dL — AB (ref 70–99)

## 2017-07-31 NOTE — Patient Instructions (Addendum)
You should be on metformin (glucophage) twice per day.  Ok to continue glipizide at 10mg  in the morning, and 5 mg at night.  Schedule appointment with eye care provider in next week to evaluate blurry vision further.   Recheck in one month, sooner if blood sugars in the 200s, or if you have any symptomatic low blood sugars as we discussed.  Fasting at next visit to recheck cholesterol. Those numbers should improve with better blood sugar readings.    IF you received an x-ray today, you will receive an invoice from [facility]. Please contact [facility] at [phone] with questions or concerns regarding your invoice.   IF you received labwork today, you will receive an invoice from [place]. Please contact [facility] at [phone] with questions or concerns regarding your invoice.   Our billing staff will not be able to assist you with questions regarding bills from these companies.  You will be contacted with the lab results as soon as they are available. The fastest way to get your results is to activate your My Chart account. Instructions are located on the last page of this paperwork. If you have not heard from [place] regarding the results in 2 weeks, please contact this office.

## 2017-07-31 NOTE — Progress Notes (Signed)
Subjective:  This chart was scribed for [name], MD by Tamsen Roers, at [facility].  This patient was seen in room 25 and the patient's care was started at 9:59 AM.   Chief Complaint  Patient presents with  . Follow-up    DM  . Blurred Vision    vision getting worse     Patient ID: Guy Horton, male    DOB: 1961/07/20, 56 y.o.   MRN: [id number]  HPI HPI Comments: Guy Horton is a 56 y.o. male who presents to Primary Care at [facility] for a follow up regarding his diabetes. Recent new diagnosis on August 7th.  He was admitted, started on metformin 1000 mg- BID, glipizide 5 mg BID, discharge glucose of 290.  Saw him august 11 th with readings 280-285.  He had elevated triglycerides thought to be due to his glucose, continued on Lipitor. Glipizide was in creased to 10 mg with largest meal and 5 mg with other meal.  With options of 10 mg BID with consistent readings over 200. History of blurry vision and was recommended optho evaluation. ---- Patient denies any diarrhea or upset stomach with his Metformin.  He has been complaint with his medications and his numbers have been ranging from 118-121.  In the evenings, they range around 130's.  He has not been hitting the 200's or having any symptomatic lows.  Patient has not seen his eye doctor yet and states that his vision is getting worse (more blurry).  He has gotten reading glasses.    Visual Acuity Screening   Right eye Left eye Both eyes  Without correction: '20/20 20/13 20/20 '$  With correction:        Patient Active Problem List   Diagnosis Date Noted  . New onset type 2 diabetes mellitus ([place]) 07/16/2017  . Hematoma of right thigh 09/13/2015  . Laceration of lower leg with complication 91/69/4503  . Non compliance w medication regimen 11/20/2014  . Gout 08/13/2012  . HTN (hypertension) 08/13/2012  . Erectile dysfunction 08/13/2012  . Hypercholesteremia 08/13/2012   Past Medical History:    Diagnosis Date  . Diabetes ([place])   . Gout   . Hyperlipemia   . Hypertension    Past Surgical History:  Procedure Laterality Date  . COLONOSCOPY    . I&D EXTREMITY Left 11/14/2016   Procedure: IRRIGATION AND DEBRIDEMENT LEFT INDEX FINGER;  Surgeon: [name], MD;  Location: [place];  Service: Orthopedics;  Laterality: Left;   No Known Allergies Prior to Admission medications   Medication Sig Start Date End Date Taking? Authorizing Provider  allopurinol (ZYLOPRIM) 100 MG tablet TAKE 2 TABLETS BY MOUTH TWICE A DAY 07/09/17   [name], MD  amLODipine (NORVASC) 5 MG tablet Take 1 tablet (5 mg total) by mouth daily. 07/09/17   [name], MD  atorvastatin (LIPITOR) 40 MG tablet Take 1 tablet (40 mg total) by mouth daily. 07/09/17   [name], MD  Blood Glucose Monitoring Suppl (ONETOUCH VERIO) w/Device KIT  07/18/17   [provider]  Blood Glucose Monitoring Suppl (TRUE METRIX METER) DEVI 1 Device by Does not apply route 4 (four) times daily. 07/17/17   [name], MD  docusate sodium (COLACE) 50 MG capsule Take 1 capsule (50 mg total) by mouth 2 (two) times daily. 07/08/17   Tasia Catchings, Amy V, [name]  glipiZIDE (GLUCOTROL) 5 MG tablet Take 1 tablet (5 mg total) by mouth 2 (two) times  daily. 07/17/17 07/17/18  [name], MD  glucose blood (TRUE METRIX BLOOD GLUCOSE TEST) test strip Use as instructed 07/17/17   [name], MD  metFORMIN (GLUCOPHAGE) 1000 MG tablet Take 1 tablet (1,000 mg total) by mouth 2 (two) times daily with a meal. 07/16/17   Leaphart, Zack Seal, [name]  metFORMIN (GLUCOPHAGE) 500 MG tablet Take 2 tablets (1,000 mg total) by mouth 2 (two) times daily with a meal. Take 1 tablet twice daily for a week then 2 tablets twice daily 07/17/17   [name], MD  telmisartan-hydrochlorothiazide (MICARDIS HCT) 80-12.5 MG tablet Take 1 tablet by mouth daily. 07/09/17   [name], MD  TRUEPLUS LANCETS 28G MISC 1 each by Does not apply route 4 (four)  times daily. 07/17/17   [name], MD   Social History   Social History  . Marital status: Married    Spouse name: N/A  . Number of children: N/A  . Years of education: N/A   Occupational History  . forklift driver    Social History Main Topics  . Smoking status: Never Smoker  . Smokeless tobacco: Never Used  . Alcohol use Yes     Comment: 3-4 beers daily  . Drug use: No  . Sexual activity: No   Other Topics Concern  . Not on file   Social History Narrative  . No narrative on file      Review of Systems  Constitutional: Negative for chills and fever.  Eyes: Positive for visual disturbance. Negative for pain, redness and itching.  Respiratory: Negative for cough and choking.   Gastrointestinal: Negative for abdominal pain, nausea and vomiting.  Neurological: Negative for syncope and speech difficulty.       Objective:   Physical Exam  Constitutional: He is oriented to person, place, and time. He appears well-developed and well-nourished. No distress.  HENT:  No temporal tenderness  Eyes: Pupils are equal, round, and reactive to light. EOM are normal.  No scleral injection.   Cardiovascular: Normal rate and normal heart sounds.   Pulmonary/Chest: Effort normal. No respiratory distress.  Abdominal: There is no tenderness.  Neurological: He is alert and oriented to person, place, and time.  Skin: Skin is warm and dry.   Vitals:   07/31/17 0928 07/31/17 0939  BP: (!) 151/82 (!) 142/84  Pulse: 71   Resp: 16   Temp: 97.9 F (36.6 C)   TempSrc: Oral   SpO2: 99%   Weight: 209 lb (94.8 kg)   Height: '5\' 11"'$  (1.803 m)    Results for orders placed or performed in visit on 07/31/17  POCT glucose (manual entry)  Result Value Ref Range   POC Glucose 114 (A) 70 - 99 mg/dl       Assessment & Plan:  Guy Horton is a 56 y.o. male New onset type 2 diabetes mellitus ([place]) - Plan: POCT glucose (manual entry) Blurred vision  - Improved readings including in  office.  Continue metformin twice per day, glipizide 10 mg every morning, 5 mg daily at bedtime. Reports persistent blurred vision, advised to follow-up with ophthalmologist, especially if symptoms are present with improved glycemic readings.  Hypercholesteremia, hypertriglyceridemia  -Hypertriglyceridemia should be improving with improved glycemic control plan on recheck in one month with fasting lab visit at that time.    No orders of the defined types were placed in this encounter.  Patient Instructions   You should be on metformin (glucophage) twice per day.  Ok to continue glipizide at '10mg'$  in the morning, and 5 mg at night.  Schedule appointment with eye care provider in next week to evaluate blurry vision further.   Recheck in one month, sooner if blood sugars in the 200s, or if you have any symptomatic low blood sugars as we discussed.  Fasting at next visit to recheck cholesterol. Those numbers should improve with better blood sugar readings.    IF you received an x-ray today, you will receive an invoice from [facility]. Please contact [facility] at [phone] with questions or concerns regarding your invoice.   IF you received labwork today, you will receive an invoice from [place]. Please contact [facility] at [phone] with questions or concerns regarding your invoice.   Our billing staff will not be able to assist you with questions regarding bills from these companies.  You will be contacted with the lab results as soon as they are available. The fastest way to get your results is to activate your My Chart account. Instructions are located on the last page of this paperwork. If you have not heard from [place] regarding the results in 2 weeks, please contact this office.      I personally performed the services described in this documentation, which was scribed in my presence. The recorded information has been reviewed and considered for accuracy and  completeness, addended by me as needed, and agree with information above.  Signed,   [name], MD Primary [facility] at [place].  08/02/17 11:48 PM

## 2017-08-16 ENCOUNTER — Encounter: Payer: Self-pay | Admitting: Gastroenterology

## 2017-08-27 ENCOUNTER — Encounter (HOSPITAL_COMMUNITY): Payer: Self-pay | Admitting: Emergency Medicine

## 2017-08-27 ENCOUNTER — Emergency Department (HOSPITAL_COMMUNITY): Payer: BLUE CROSS/BLUE SHIELD

## 2017-08-27 DIAGNOSIS — Z5321 Procedure and treatment not carried out due to patient leaving prior to being seen by health care provider: Secondary | ICD-10-CM | POA: Insufficient documentation

## 2017-08-27 DIAGNOSIS — M25511 Pain in right shoulder: Secondary | ICD-10-CM | POA: Diagnosis present

## 2017-08-27 NOTE — ED Triage Notes (Signed)
Pt was at work tonight and was lifting a box when he injured his rt shoulder.  C/o shoulder/collar bone pain.

## 2017-08-27 NOTE — ED Notes (Signed)
Pt did not answer for roll call

## 2017-08-28 ENCOUNTER — Encounter (HOSPITAL_COMMUNITY): Payer: Self-pay | Admitting: Emergency Medicine

## 2017-08-28 ENCOUNTER — Emergency Department (HOSPITAL_COMMUNITY)
Admission: EM | Admit: 2017-08-28 | Discharge: 2017-08-28 | Disposition: A | Discharge status: Home / Self Care | Payer: BLUE CROSS/BLUE SHIELD | Attending: Emergency Medicine | Admitting: Emergency Medicine

## 2017-08-28 ENCOUNTER — Emergency Department (HOSPITAL_COMMUNITY): Payer: BLUE CROSS/BLUE SHIELD

## 2017-08-28 ENCOUNTER — Emergency Department (HOSPITAL_COMMUNITY)
Admission: EM | Admit: 2017-08-28 | Discharge: 2017-08-28 | Discharge status: Against Medical Advice | Payer: BLUE CROSS/BLUE SHIELD | Attending: Emergency Medicine | Admitting: Emergency Medicine

## 2017-08-28 DIAGNOSIS — Y929 Unspecified place or not applicable: Secondary | ICD-10-CM | POA: Insufficient documentation

## 2017-08-28 DIAGNOSIS — S4991XA Unspecified injury of right shoulder and upper arm, initial encounter: Secondary | ICD-10-CM | POA: Insufficient documentation

## 2017-08-28 DIAGNOSIS — X500XXA Overexertion from strenuous movement or load, initial encounter: Secondary | ICD-10-CM | POA: Insufficient documentation

## 2017-08-28 DIAGNOSIS — Y990 Civilian activity done for income or pay: Secondary | ICD-10-CM | POA: Insufficient documentation

## 2017-08-28 DIAGNOSIS — Z79899 Other long term (current) drug therapy: Secondary | ICD-10-CM | POA: Insufficient documentation

## 2017-08-28 DIAGNOSIS — I10 Essential (primary) hypertension: Secondary | ICD-10-CM | POA: Insufficient documentation

## 2017-08-28 DIAGNOSIS — Y939 Activity, unspecified: Secondary | ICD-10-CM | POA: Insufficient documentation

## 2017-08-28 DIAGNOSIS — Z7984 Long term (current) use of oral hypoglycemic drugs: Secondary | ICD-10-CM | POA: Insufficient documentation

## 2017-08-28 DIAGNOSIS — E119 Type 2 diabetes mellitus without complications: Secondary | ICD-10-CM | POA: Insufficient documentation

## 2017-08-28 MED ORDER — IBUPROFEN 600 MG PO TABS: 600.0000 mg | ORAL_TABLET | Freq: Four times a day (QID) | ORAL | 0 refills | Status: DC | PRN

## 2017-08-28 NOTE — ED Notes (Signed)
Bed: WTR6 Expected date:  Expected time:  Means of arrival:  Comments: 

## 2017-08-28 NOTE — ED Triage Notes (Signed)
Pt was at work yesterday at [place] and lifted a box overhead when he felt a pull in his right shoulder with pain radiating to the right collar bone.

## 2017-08-28 NOTE — Discharge Instructions (Signed)
You have been seen today for a shoulder injury. There were no acute abnormalities on the x-rays, including no sign of fracture or dislocation, but there were signs of chronic osteoarthritic changes. Pain: Take 600 mg of ibuprofen every 6 hours or 440 mg (over the counter dose) to 500 mg (prescription dose) of naproxen every 12 hours for the next 3 days. After this time, these medications may be used as needed for pain. Take these medications with food to avoid upset stomach. Choose only one of these medications, do not take them together.  Tylenol: Should you continue to have additional pain while taking the ibuprofen or naproxen, you may add in tylenol as needed. Your daily total maximum amount of tylenol from all sources should be limited to 4000mg /day for persons without liver problems, or 2000mg /day for those with liver problems. Ice: May apply ice to the area over the next 24 hours for 15 minutes at a time to reduce swelling. Elevation: Keep the extremity elevated as often as possible to reduce pain and inflammation. Exercises: Start by performing these exercises a few times a week, increasing the frequency until you are performing them twice daily.  Follow up: If symptoms are improving, you may follow up with your primary care provider for any continued management. If symptoms are not improving, you may follow up with the orthopedic specialist.

## 2017-08-28 NOTE — ED Notes (Signed)
No answer when called for vitals. 

## 2017-08-28 NOTE — ED Provider Notes (Signed)
[facility] DEPT Provider Note   CSN: [id number] Arrival date & time: 08/28/17  0800     History   Chief Complaint Chief Complaint  Patient presents with  . Shoulder Injury    HPI KUNTA HILLEARY is a 56 y.o. male.  HPI   NYJAH DENIO is a 56 y.o. male, with a history of DM and HTN, presenting to the ED with right shoulder pain beginning last night. States he works at [employer], was lifting boxes, and then felt a pain in the right anterior shoulder. Shoulder feels stiff. Pain Feels like a tightness, rated 8/10, nonradiating. He has not taken any medications for this complaint. He denies previous injury or surgery to the shoulder. Denies falls, weakness, numbness, or any other injuries or complaints.    Past Medical History:  Diagnosis Date  . Diabetes ([place])   . Gout   . Hyperlipemia   . Hypertension     Patient Active Problem List   Diagnosis Date Noted  . New onset type 2 diabetes mellitus ([place]) 07/16/2017  . Hematoma of right thigh 09/13/2015  . Laceration of lower leg with complication 62/86/3817  . Non compliance w medication regimen 11/20/2014  . Gout 08/13/2012  . HTN (hypertension) 08/13/2012  . Erectile dysfunction 08/13/2012  . Hypercholesteremia 08/13/2012    Past Surgical History:  Procedure Laterality Date  . COLONOSCOPY    . I&D EXTREMITY Left 11/14/2016   Procedure: IRRIGATION AND DEBRIDEMENT LEFT INDEX FINGER;  Surgeon: [name], MD;  Location: [place];  Service: Orthopedics;  Laterality: Left;       Home Medications    Prior to Admission medications   Medication Sig Start Date End Date Taking? Authorizing Provider  allopurinol (ZYLOPRIM) 100 MG tablet TAKE 2 TABLETS BY MOUTH TWICE A DAY 07/09/17   [name], MD  amLODipine (NORVASC) 5 MG tablet Take 1 tablet (5 mg total) by mouth daily. 07/09/17   [name], MD  atorvastatin (LIPITOR) 40 MG tablet Take 1 tablet (40 mg total) by mouth daily. 07/09/17   [name], MD    Blood Glucose Monitoring Suppl (ONETOUCH VERIO) w/Device KIT  07/18/17   [provider]  Blood Glucose Monitoring Suppl (TRUE METRIX METER) DEVI 1 Device by Does not apply route 4 (four) times daily. 07/17/17   [name], MD  docusate sodium (COLACE) 50 MG capsule Take 1 capsule (50 mg total) by mouth 2 (two) times daily. 07/08/17   Tasia Catchings, Amy V, [name]  glipiZIDE (GLUCOTROL) 5 MG tablet Take 1 tablet (5 mg total) by mouth 2 (two) times daily. 07/17/17 07/17/18  [name], MD  glucose blood (TRUE METRIX BLOOD GLUCOSE TEST) test strip Use as instructed 07/17/17   [name], MD  ibuprofen (ADVIL,MOTRIN) 600 MG tablet Take 1 tablet (600 mg total) by mouth every 6 (six) hours as needed. 08/28/17   Destynie Toomey C, [name]  metFORMIN (GLUCOPHAGE) 1000 MG tablet Take 1 tablet (1,000 mg total) by mouth 2 (two) times daily with a meal. 07/16/17   Leaphart, Zack Seal, [name]  metFORMIN (GLUCOPHAGE) 500 MG tablet Take 2 tablets (1,000 mg total) by mouth 2 (two) times daily with a meal. Take 1 tablet twice daily for a week then 2 tablets twice daily 07/17/17   [name], MD  telmisartan-hydrochlorothiazide (MICARDIS HCT) 80-12.5 MG tablet Take 1 tablet by mouth daily. 07/09/17   [name], MD  TRUEPLUS LANCETS 28G MISC 1 each by Does  not apply route 4 (four) times daily. 07/17/17   [name], MD    Family History Family History  Problem Relation Age of Onset  . Diabetes Mother   . Other Father 42       Work accident  . Prostate cancer Father     Social History Social History  Substance Use Topics  . Smoking status: Never Smoker  . Smokeless tobacco: Never Used  . Alcohol use Yes     Comment: 3-4 beers daily     Allergies   Patient has no known allergies.   Review of Systems Review of Systems  Constitutional: Negative for chills and fever.  Musculoskeletal: Positive for arthralgias. Negative for joint swelling.  Neurological: Negative for weakness and numbness.      Physical Exam Updated Vital Signs BP 128/81 (BP Location: Left Arm)   Pulse 68   Temp 97.6 F (36.4 C) (Oral)   Resp 16   Ht _0  (1.803 m)   Wt 96.6 kg (213 lb)   SpO2 98%   BMI 29.71 kg/m   Physical Exam  Constitutional: He appears well-developed and well-nourished. No distress.  HENT:  Head: Normocephalic and atraumatic.  Eyes: Conjunctivae are normal.  Neck: Normal range of motion. Neck supple.  Cardiovascular: Normal rate, regular rhythm and intact distal pulses.   Pulmonary/Chest: Effort normal.  Musculoskeletal: He exhibits tenderness. He exhibits no edema or deformity.  Tenderness to the right anterior shoulder without noted crepitus, instability, swelling, or deformity. Full passive and active range of motion in the right shoulder, but painful especially with abduction.  Full passive and active range of motion in the elbow and wrist without pain.  Neurological: He is alert.  No noted sensory deficits in the right upper extremity. Grip strengths equal. Strength 5/5 in the cardinal directions of the right wrist and elbow. Strength 5/5 in the cardinal directions on the right shoulder.  Skin: Skin is warm and dry. He is not diaphoretic. No pallor.  Psychiatric: He has a normal mood and affect. His behavior is normal.  Nursing note and vitals reviewed.    ED Treatments / Results  Labs (all labs ordered are listed, but only abnormal results are displayed) Labs Reviewed - No data to display  EKG  EKG Interpretation None       Radiology Dg Shoulder Right  Result Date: 08/28/2017 CLINICAL DATA:  Right shoulder pain after injury at work yesterday. Initial encounter. EXAM: RIGHT SHOULDER - 2+ VIEW COMPARISON:  None. FINDINGS: There is no evidence of fracture or dislocation. Glenohumeral osteoarthritis with prominent inferior spurring and subchondral cystic change. Subchondral sclerosis and cystic change along the humeral head without collapse. The glenohumeral  joint is narrowed on axillary view, with posterior positioning of the head. IMPRESSION: 1. No acute osseous finding. 2. Advanced glenohumeral osteoarthritis. Electronically Signed   By: Monte Fantasia M.D.   On: 08/28/2017 08:50    Procedures Procedures (including critical care time)  Medications Ordered in ED Medications - No data to display   Initial Impression / Assessment and Plan / ED Course  I have reviewed the triage vital signs and the nursing notes.  Pertinent labs & imaging results that were available during my care of the patient were reviewed by me and considered in my medical decision making (see chart for details).     Patient presents with right shoulder injury. Acute abnormality on x-ray. No red flag symptoms. Orthopedic follow-up. The patient was given instructions for home care  as well as return precautions. Patient voices understanding of these instructions, accepts the plan, and is comfortable with discharge.  Final Clinical Impressions(s) / ED Diagnoses   Final diagnoses:  Injury of right shoulder, initial encounter    New Prescriptions New Prescriptions   IBUPROFEN (ADVIL,MOTRIN) 600 MG TABLET    Take 1 tablet (600 mg total) by mouth every 6 (six) hours as needed.     Lorayne Bender, [name] 08/28/17 [id number]    Julianne Rice, MD 08/31/17 1517

## 2017-09-03 ENCOUNTER — Ambulatory Visit (INDEPENDENT_AMBULATORY_CARE_PROVIDER_SITE_OTHER): Payer: BLUE CROSS/BLUE SHIELD | Admitting: Family Medicine

## 2017-09-03 ENCOUNTER — Encounter: Payer: Self-pay | Admitting: Family Medicine

## 2017-09-03 VITALS — BP 143/81 | HR 57 | Temp 98.2°F | Resp 17 | Ht 71.0 in | Wt 213.0 lb

## 2017-09-03 DIAGNOSIS — E119 Type 2 diabetes mellitus without complications: Secondary | ICD-10-CM

## 2017-09-03 DIAGNOSIS — E782 Mixed hyperlipidemia: Secondary | ICD-10-CM | POA: Diagnosis not present

## 2017-09-03 DIAGNOSIS — M19019 Primary osteoarthritis, unspecified shoulder: Secondary | ICD-10-CM

## 2017-09-03 DIAGNOSIS — M25511 Pain in right shoulder: Secondary | ICD-10-CM

## 2017-09-03 DIAGNOSIS — K645 Perianal venous thrombosis: Secondary | ICD-10-CM

## 2017-09-03 MED ORDER — HYDROCORTISONE 2.5 % RE CREA: 1.0000 "application " | TOPICAL_CREAM | Freq: Two times a day (BID) | RECTAL | 0 refills | Status: DC

## 2017-09-03 MED ORDER — METFORMIN HCL 1000 MG PO TABS: 1000.0000 mg | ORAL_TABLET | Freq: Two times a day (BID) | ORAL | 1 refills | Status: DC

## 2017-09-03 MED ORDER — GLIPIZIDE 5 MG PO TABS: 5.0000 mg | ORAL_TABLET | Freq: Two times a day (BID) | ORAL | 2 refills | Status: DC

## 2017-09-03 NOTE — Progress Notes (Signed)
Subjective:  By signing my name below, I, Guy Horton, attest that this documentation has been prepared under the direction and in the presence of [name], MD Electronically Signed: Ladene Artist, ED Scribe 09/03/2017 at 9:51 AM.   Patient ID: Guy Horton, male    DOB: January 17, 1961, 56 y.o.   MRN: [id number]  Chief Complaint  Patient presents with  . Follow-up  . Diabetes   HPI Guy Horton is a 56 y.o. male who presents to Primary Care at [facility] for a follow-up. Diagnosed with diabetes on 8/7 with an A1C of 14. Admitted, started on Metformin 1000 mg, glipizide 5 mg bid. Glipizide then increased to 10 mg at f/u visit. He had improved readings on 8/22 with glucose 114. Continued Metformin 1000 mg bid, glipizide 10 mg in the AM, 5 mg at bedtime. Advised to follow-up with optho as he has had persistent blurry visit. Pt has been checking his blood glucose regularly with average readings of 109-115. Lowest reading of 90 and highest of 126. States blurry vision resolved and he has not had time to see his eye doctor.   Hyperlipidemia Lab Results  Component Value Date   CHOL 447 (H) 07/16/2017   HDL NOT REPORTED DUE TO HIGH TRIGLYCERIDES 07/16/2017   LDLCALC UNABLE TO CALCULATE IF TRIGLYCERIDE OVER 400 mg/dL 07/16/2017   TRIG 3,336 (H) 07/16/2017   CHOLHDL NOT REPORTED DUE TO HIGH TRIGLYCERIDES 07/16/2017  Markedly elevated triglycerides thought to be due to uncontrolled and new diabetes. Planned for fasting lipids today. Takes Lipitor 40 mg qd but ran out of Lipitor 1.5 week ago. Denies abdominal pain, nausea, vomiting.  Right Shoulder Pain Pt also reports right shoulder pain onset 5-6 months. Seen at [facility] 6 days ago. Per that note, he had pain starting the night prior. Pt works at [employer] where he states he thinks he acutely injured his shoulder while lifting boxes approximately 6 days ago. Reported pain at the right anterior shoulder which is exacerbated with sleeping on  the right arm. XR indicated advanced glenohumeral osteoarthrosis. Planned on ortho follow-up. No medications tried PTA.   H/o Rectal Irritation  July 31 pt was referred for perianal irritation. Heme-positive stool. Pt has an appointment on November 4 with GI doctor. Noticed a gradually improving "bump" in the rectal area 1 week ago. Pt states the pain was worse a few days ago but is improving. No bleeding   Patient Active Problem List   Diagnosis Date Noted  . New onset type 2 diabetes mellitus ([place]) 07/16/2017  . Hematoma of right thigh 09/13/2015  . Laceration of lower leg with complication 09/98/3382  . Non compliance w medication regimen 11/20/2014  . Gout 08/13/2012  . HTN (hypertension) 08/13/2012  . Erectile dysfunction 08/13/2012  . Hypercholesteremia 08/13/2012   Past Medical History:  Diagnosis Date  . Diabetes ([place])   . Gout   . Hyperlipemia   . Hypertension    Past Surgical History:  Procedure Laterality Date  . COLONOSCOPY    . I&D EXTREMITY Left 11/14/2016   Procedure: IRRIGATION AND DEBRIDEMENT LEFT INDEX FINGER;  Surgeon: [name], MD;  Location: [facility];  Service: Orthopedics;  Laterality: Left;   No Known Allergies Prior to Admission medications   Medication Sig Start Date End Date Taking? Authorizing Provider  allopurinol (ZYLOPRIM) 100 MG tablet TAKE 2 TABLETS BY MOUTH TWICE A DAY 07/09/17   [name], MD  amLODipine (NORVASC) 5 MG tablet Take 1 tablet (  5 mg total) by mouth daily. 07/09/17   [name], MD  atorvastatin (LIPITOR) 40 MG tablet Take 1 tablet (40 mg total) by mouth daily. 07/09/17   [name], MD  Blood Glucose Monitoring Suppl (ONETOUCH VERIO) w/Device KIT  07/18/17   [provider]  Blood Glucose Monitoring Suppl (TRUE METRIX METER) DEVI 1 Device by Does not apply route 4 (four) times daily. 07/17/17   [name], MD  docusate sodium (COLACE) 50 MG capsule Take 1 capsule (50 mg total) by mouth 2 (two) times  daily. 07/08/17   Tasia Catchings, Amy V, [name]  glipiZIDE (GLUCOTROL) 5 MG tablet Take 1 tablet (5 mg total) by mouth 2 (two) times daily. 07/17/17 07/17/18  [name], MD  glucose blood (TRUE METRIX BLOOD GLUCOSE TEST) test strip Use as instructed 07/17/17   [name], MD  ibuprofen (ADVIL,MOTRIN) 600 MG tablet Take 1 tablet (600 mg total) by mouth every 6 (six) hours as needed. 08/28/17   Joy, Shawn C, [name]  metFORMIN (GLUCOPHAGE) 1000 MG tablet Take 1 tablet (1,000 mg total) by mouth 2 (two) times daily with a meal. 07/16/17   Leaphart, Zack Seal, [name]  metFORMIN (GLUCOPHAGE) 500 MG tablet Take 2 tablets (1,000 mg total) by mouth 2 (two) times daily with a meal. Take 1 tablet twice daily for a week then 2 tablets twice daily 07/17/17   [name], MD  telmisartan-hydrochlorothiazide (MICARDIS HCT) 80-12.5 MG tablet Take 1 tablet by mouth daily. 07/09/17   [name], MD  TRUEPLUS LANCETS 28G MISC 1 each by Does not apply route 4 (four) times daily. 07/17/17   [name], MD   Social History   Social History  . Marital status: Married    Spouse name: N/A  . Number of children: N/A  . Years of education: N/A   Occupational History  . forklift driver    Social History Main Topics  . Smoking status: Never Smoker  . Smokeless tobacco: Never Used  . Alcohol use Yes     Comment: 3-4 beers daily  . Drug use: No  . Sexual activity: No   Other Topics Concern  . Not on file   Social History Narrative  . No narrative on file   Review of Systems  Eyes: Negative for visual disturbance.  Gastrointestinal: Positive for rectal pain. Negative for abdominal distention, nausea and vomiting.  Musculoskeletal: Positive for arthralgias.      Objective:   Physical Exam  Constitutional: He is oriented to person, place, and time. He appears well-developed and well-nourished. No distress.  HENT:  Head: Normocephalic and atraumatic.  Eyes: Conjunctivae and EOM are normal.  Neck: Neck  supple. No tracheal deviation present.  Cardiovascular: Normal rate.   Pulmonary/Chest: Effort normal. No respiratory distress.  Genitourinary:  Genitourinary Comments: Thrombosed hemorrhoid at 2'oclock. No active bleeding. minimally tender.  Musculoskeletal: Normal range of motion.  C-spine pain free ROM. Full strength in UE bilaterally. Full ROM of R shoulder but discomfort at extension and ABduction. Full rotator cuff testing. Negative Neer and Negative Hawkins.  Neurological: He is alert and oriented to person, place, and time.  Reflex Scores:      Tricep reflexes are 1+ on the right side and 1+ on the left side.      Bicep reflexes are 1+ on the right side and 1+ on the left side.      Brachioradialis reflexes are 1+ on the right side and 1+  on the left side. Skin: Skin is warm and dry.  Psychiatric: He has a normal mood and affect. His behavior is normal.  Nursing note and vitals reviewed.  Vitals:   09/03/17 0928 09/03/17 1022  BP: (!) 159/97 (!) 143/81  Pulse: 62 (!) 57  Resp: 17   Temp: 98.2 F (36.8 C)   TempSrc: Oral   SpO2: 98%   Weight: 213 lb (96.6 kg)   Height: '5\' 11"'$  (1.803 m)       Assessment & Plan:   Guy Horton is a 56 y.o. male Type 2 diabetes mellitus without complication, without long-term current use of insulin ([place]) - Plan: metFORMIN (GLUCOPHAGE) 1000 MG tablet, glipiZIDE (GLUCOTROL) 5 MG tablet  - Improved control on home readings. Continue metformin 1000 mg twice a day, due to relatively low reading will return to 5 mg twice a day dosing of glipizide. Recheck in one month for repeat A1c.  Mixed hyperlipidemia  - Unfortunately he stopped medication, so we will hold on testing today. Restart Lipitor, recheck in one month and can check fasting lipids at that time. Denies nausea/vomiting/abdominal pain, but ER precautions have been discussed with his hypertriglyceridemia previously.  Pain in joint of right shoulder - Plan: Ambulatory referral to  Orthopedic Surgery Shoulder arthritis - Plan: Ambulatory referral to Orthopedic Surgery  - Significant arthritis of right shoulder, long-standing discomfort. We'll refer to orthopedics to determine next step in treatment regarding injection versus attempted physical therapy. Did note in possible injury at work recently, but was having pain and shoulder previously.   Thrombosed external hemorrhoid - Plan: hydrocortisone (ANUSOL-HC) 2.5 % rectal cream  -Thrombosed external hemorrhoid based on appearance in office, has had some improvement in pain.  Decided against incision today as he is improving. Anusol cream if needed, RTC precautions. Has gastroenterology follow-up planned. Handout given.   Meds ordered this encounter  Medications  . hydrocortisone (ANUSOL-HC) 2.5 % rectal cream    Sig: Place 1 application rectally 2 (two) times daily.    Dispense:  30 g    Refill:  0  . metFORMIN (GLUCOPHAGE) 1000 MG tablet    Sig: Take 1 tablet (1,000 mg total) by mouth 2 (two) times daily with a meal.    Dispense:  180 tablet    Refill:  1  . glipiZIDE (GLUCOTROL) 5 MG tablet    Sig: Take 1 tablet (5 mg total) by mouth 2 (two) times daily.    Dispense:  60 tablet    Refill:  2   Patient Instructions    I would recommend following up with orthopaedics regarding the arthritis and pain in your shoulder. I will refer you. Tylenol would be safest for now, instead of NSAIDS like ibuprofen or alleve as those can affect your blood pressure.    Bump at anus appears to be a thrombosed hemorrhoid. If the pain is improving, no other treatment needed for now unless you have irritation or itching and can use the Anusol HC cream twice per day. If any worsening, please return for recheck. This can also be discussed with gastroenterology at your appointment.  For diabetes, continue metformin but I will change it to 1000 mg pill, one pill twice per day. Okay to decrease glipizide to 5 mg twice per day. Recheck in one  month for repeat labs   For cholesterol, we do need to recheck your levels, but unfortunately with you being off medicines recently, today would not be an effective test. We will  recheck at next visit.  Return to the clinic or go to the nearest emergency room if any of your symptoms worsen or new symptoms occur.  About Hemorrhoids  Hemorrhoids are swollen veins in the lower rectum and anus.  Also called piles, hemorrhoids are a common problem.  Hemorrhoids may be internal (inside the rectum) or external (around the anus).  Internal Hemorrhoids  Internal hemorrhoids are often painless, but they rarely cause bleeding.  The internal veins may stretch and fall down (prolapse) through the anus to the outside of the body.  The veins may then become irritated and painful.  External Hemorrhoids  External hemorrhoids can be easily seen or felt around the anal opening.  They are under the skin around the anus.  When the swollen veins are scratched or broken by straining, rubbing or wiping they sometimes bleed.  How Hemorrhoids Occur  Veins in the rectum and around the anus tend to swell under pressure.  Hemorrhoids can result from increased pressure in the veins of your anus or rectum.  Some sources of pressure are:   Straining to have a bowel movement because of constipation  Waiting too long to have a bowel movement  Coughing and sneezing often  Sitting for extended periods of time, including on the toilet  Diarrhea  Obesity  Trauma or injury to the anus  Some liver diseases  Stress  Family history of hemorrhoids  Pregnancy  Pregnant women should try to avoid becoming constipated, because they are more likely to have hemorrhoids during pregnancy.  In the last trimester of pregnancy, the enlarged uterus may press on blood vessels and causes hemorrhoids.  In addition, the strain of childbirth sometimes causes hemorrhoids after the birth.  Symptoms of Hemorrhoids  Some symptoms  of hemorrhoids include:  Swelling and/or a tender lump around the anus  Itching, mild burning and bleeding around the anus  Painful bowel movements with or without constipation  Bright red blood covering the stool, on toilet paper or in the toilet bowel.   Symptoms usually go away within a few days.  Always talk to your doctor about any bleeding to make sure it is not from some other causes.  Diagnosing and Treating Hemorrhoids  Diagnosis is made by an examination by your healthcare provider.  Special test can be performed by your doctor.    Most cases of hemorrhoids can be treated with:  High-fiber diet: Eat more high-fiber foods, which help prevent constipation.  Ask for more detailed fiber information on types and sources of fiber from your healthcare provider.  Fluids: Drink plenty of water.  This helps soften bowel movements so they are easier to pass.  Sitz baths and cold packs: Sitting in lukewarm water two or three times a day for 15 minutes cleases the anal area and may relieve discomfort.  If the water is too hot, swelling around the anus will get worse.  Placing a cloth-covered ice pack on the anus for ten minutes four times a day can also help reduce selling.  Gently pushing a prolapsed hemorrhoid back inside after the bath or ice pack can be helpful.  Medications: For mild discomfort, your healthcare provider may suggest over-the-counter pain medication or prescribe a cream or ointment for topical use.  The cream may contain witch hazel, zinc oxide or petroleum jelly.  Medicated suppositories are also a treatment option.  Always consult your doctor before applying medications or creams.  Procedures and surgeries: There are also a number of procedures  and surgeries to shrink or remove hemorrhoids in more serious cases.  Talk to your physician about these options.  You can often prevent hemorrhoids or keep them from becoming worse by maintaining a healthy lifestyle.  Eat a  fiber-rich diet of fruits, vegetables and whole grains.  Also, drink plenty of water and exercise regularly.   2007, [employer] Doc.30   IF you received an x-ray today, you will receive an invoice from [facility]. Please contact [facility] at [phone] with questions or concerns regarding your invoice.   IF you received labwork today, you will receive an invoice from [facility]. Please contact [facility] at [phone] with questions or concerns regarding your invoice.   Our billing staff will not be able to assist you with questions regarding bills from these companies.  You will be contacted with the lab results as soon as they are available. The fastest way to get your results is to activate your My Chart account. Instructions are located on the last page of this paperwork. If you have not heard from [place] regarding the results in 2 weeks, please contact this office.       I personally performed the services described in this documentation, which was scribed in my presence. The recorded information has been reviewed and considered for accuracy and completeness, addended by me as needed, and agree with information above.  Signed,   [name], MD Primary [facility] at [facility].  09/03/17 12:37 PM

## 2017-09-03 NOTE — Patient Instructions (Addendum)
I would recommend following up with orthopaedics regarding the arthritis and pain in your shoulder. I will refer you. Tylenol would be safest for now, instead of NSAIDS like ibuprofen or alleve as those can affect your blood pressure.    Bump at anus appears to be a thrombosed hemorrhoid. If the pain is improving, no other treatment needed for now unless you have irritation or itching and can use the Anusol HC cream twice per day. If any worsening, please return for recheck. This can also be discussed with gastroenterology at your appointment.  For diabetes, continue metformin but I will change it to 1000 mg pill, one pill twice per day. Okay to decrease glipizide to 5 mg twice per day. Recheck in one month for repeat labs   For cholesterol, we do need to recheck your levels, but unfortunately with you being off medicines recently, today would not be an effective test. We will recheck at next visit.  Return to the clinic or go to the nearest emergency room if any of your symptoms worsen or new symptoms occur.  About Hemorrhoids  Hemorrhoids are swollen veins in the lower rectum and anus.  Also called piles, hemorrhoids are a common problem.  Hemorrhoids may be internal (inside the rectum) or external (around the anus).  Internal Hemorrhoids  Internal hemorrhoids are often painless, but they rarely cause bleeding.  The internal veins may stretch and fall down (prolapse) through the anus to the outside of the body.  The veins may then become irritated and painful.  External Hemorrhoids  External hemorrhoids can be easily seen or felt around the anal opening.  They are under the skin around the anus.  When the swollen veins are scratched or broken by straining, rubbing or wiping they sometimes bleed.  How Hemorrhoids Occur  Veins in the rectum and around the anus tend to swell under pressure.  Hemorrhoids can result from increased pressure in the veins of your anus or rectum.  Some sources of  pressure are:   Straining to have a bowel movement because of constipation  Waiting too long to have a bowel movement  Coughing and sneezing often  Sitting for extended periods of time, including on the toilet  Diarrhea  Obesity  Trauma or injury to the anus  Some liver diseases  Stress  Family history of hemorrhoids  Pregnancy  Pregnant women should try to avoid becoming constipated, because they are more likely to have hemorrhoids during pregnancy.  In the last trimester of pregnancy, the enlarged uterus may press on blood vessels and causes hemorrhoids.  In addition, the strain of childbirth sometimes causes hemorrhoids after the birth.  Symptoms of Hemorrhoids  Some symptoms of hemorrhoids include:  Swelling and/or a tender lump around the anus  Itching, mild burning and bleeding around the anus  Painful bowel movements with or without constipation  Bright red blood covering the stool, on toilet paper or in the toilet bowel.   Symptoms usually go away within a few days.  Always talk to your doctor about any bleeding to make sure it is not from some other causes.  Diagnosing and Treating Hemorrhoids  Diagnosis is made by an examination by your healthcare provider.  Special test can be performed by your doctor.    Most cases of hemorrhoids can be treated with:  High-fiber diet: Eat more high-fiber foods, which help prevent constipation.  Ask for more detailed fiber information on types and sources of fiber from your healthcare provider.  Fluids: Drink plenty of water.  This helps soften bowel movements so they are easier to pass.  Sitz baths and cold packs: Sitting in lukewarm water two or three times a day for 15 minutes cleases the anal area and may relieve discomfort.  If the water is too hot, swelling around the anus will get worse.  Placing a cloth-covered ice pack on the anus for ten minutes four times a day can also help reduce selling.  Gently pushing a  prolapsed hemorrhoid back inside after the bath or ice pack can be helpful.  Medications: For mild discomfort, your healthcare provider may suggest over-the-counter pain medication or prescribe a cream or ointment for topical use.  The cream may contain witch hazel, zinc oxide or petroleum jelly.  Medicated suppositories are also a treatment option.  Always consult your doctor before applying medications or creams.  Procedures and surgeries: There are also a number of procedures and surgeries to shrink or remove hemorrhoids in more serious cases.  Talk to your physician about these options.  You can often prevent hemorrhoids or keep them from becoming worse by maintaining a healthy lifestyle.  Eat a fiber-rich diet of fruits, vegetables and whole grains.  Also, drink plenty of water and exercise regularly.   2007, [employer] Doc.30   IF you received an x-ray today, you will receive an invoice from [facility]. Please contact [facility] at [phone] with questions or concerns regarding your invoice.   IF you received labwork today, you will receive an invoice from [facility]. Please contact [facility] at [phone] with questions or concerns regarding your invoice.   Our billing staff will not be able to assist you with questions regarding bills from these companies.  You will be contacted with the lab results as soon as they are available. The fastest way to get your results is to activate your My Chart account. Instructions are located on the last page of this paperwork. If you have not heard from [place] regarding the results in 2 weeks, please contact this office.

## 2017-09-18 ENCOUNTER — Telehealth (INDEPENDENT_AMBULATORY_CARE_PROVIDER_SITE_OTHER): Payer: Self-pay

## 2017-09-18 ENCOUNTER — Encounter (INDEPENDENT_AMBULATORY_CARE_PROVIDER_SITE_OTHER): Payer: Self-pay | Admitting: Orthopedic Surgery

## 2017-09-18 ENCOUNTER — Ambulatory Visit (INDEPENDENT_AMBULATORY_CARE_PROVIDER_SITE_OTHER): Payer: BLUE CROSS/BLUE SHIELD | Admitting: Orthopedic Surgery

## 2017-09-18 DIAGNOSIS — M25511 Pain in right shoulder: Secondary | ICD-10-CM | POA: Diagnosis not present

## 2017-09-18 DIAGNOSIS — M19011 Primary osteoarthritis, right shoulder: Secondary | ICD-10-CM

## 2017-09-18 NOTE — Telephone Encounter (Signed)
Patient seen this morning for shoulder pain. Injection was performed. Patient requested note just in case he was unable to go to work tonight due to pain. Note was provided. Patient called this afternoon stating he did go to work and showed him note that he was provided today and they advised patient could not return to work tonight since note stated to excuse him from work. Patient requested note stating ok for him to work tonight. This was provided for patient.

## 2017-09-18 NOTE — Telephone Encounter (Signed)
ok 

## 2017-09-23 MED ORDER — LIDOCAINE HCL 1 % IJ SOLN
5.0000 mL | INTRAMUSCULAR | Status: AC | PRN
  Administered 2017-09-23: 5 mL

## 2017-09-23 MED ORDER — METHYLPREDNISOLONE ACETATE 40 MG/ML IJ SUSP
40.0000 mg | INTRAMUSCULAR | Status: AC | PRN
  Administered 2017-09-23: 40 mg via INTRA_ARTICULAR

## 2017-09-23 MED ORDER — BUPIVACAINE HCL 0.5 % IJ SOLN
9.0000 mL | INTRAMUSCULAR | Status: AC | PRN
  Administered 2017-09-23: 9 mL via INTRA_ARTICULAR

## 2017-09-23 NOTE — Progress Notes (Addendum)
Office Visit Note   Patient: Guy Horton           Date of Birth: 11/25/1961           MRN: [id number] Visit Date: 09/18/2017 Requested by: Guy Mclean, MD Guy Horton, [place] [id number] PCP: Guy Mclean, MD  Subjective: Chief Complaint  Patient presents with  . Right Shoulder - Injury    HPI: Guy Horton is a 48 patient with right shoulder pain.  Injured his shoulder 08/28/2017 when he was lifting boxes at work and he felt a pull.  He went to the emergency room.  Those x-rays are reviewed.  Hurts him to sleep at night.  Does not report any weakness.  Outside radiographs are reviewed and it does show inferior humeral head osteophyte as well as glenoid cystic changes along its inferior aspect.  Denies any radicular pain.  He is still working regular duty.              ROS: All systems reviewed are negative as they relate to the chief complaint within the history of present illness.  Patient denies  fevers or chills.   Assessment & Plan: Visit Diagnoses:  1. Arthritis of right shoulder region   2. Acute pain of right shoulder     Plan: impression is good rotator cuff strength with exacerbation of existing arthritis from work injury.  Plan is out of work today.  I would like to try an intra-articular injection.  We will see if that helps him.  Continue with anti-inflammatories and trying to achieve range of motion.  He may need shoulder replacement in the future but he is on the young side for that at this time  Follow-Up Instructions: Return if symptoms worsen or fail to improve.   Orders:  No orders of the defined types were placed in this encounter.  No orders of the defined types were placed in this encounter.     Procedures: Large Joint Inj Date/Time: 09/23/2017 12:52 PM Performed by: Guy Horton Authorized by: Guy Horton   Consent Given by:  Patient Site marked: the procedure site was marked   Timeout: prior to  procedure the correct patient, procedure, and site was verified   Indications:  Pain and diagnostic evaluation Location:  Shoulder Site:  R glenohumeral Prep: patient was prepped and draped in usual sterile fashion   Needle Size:  18 G Needle Length:  1.5 inches Approach:  Posterior Ultrasound Guidance: No   Fluoroscopic Guidance: No   Arthrogram: No   Medications:  5 mL lidocaine 1 %; 9 mL bupivacaine 0.5 %; 40 mg methylPREDNISolone acetate 40 MG/ML Aspiration Attempted: No   Patient tolerance:  Patient tolerated the procedure well with no immediate complications     Clinical Data: No additional findings.  Objective: Vital Signs: There were no vitals taken for this visit.  Physical Exam:   Constitutional: Patient appears well-developed HEENT:  Head: Normocephalic Eyes:EOM are normal Neck: Normal range of motion Cardiovascular: Normal rate Pulmonary/chest: Effort normal Neurologic: Patient is alert Skin: Skin is warm Psychiatric: Patient has normal mood and affect    Ortho Exam: orthopedic exam demonstrates good cervical spine range of motion intact motor sensory function to both hands.  Radial pulses intact.  Rotator cuff strength is good on the right-hand side to isolated infraspinatus supraspinatus and subscap muscle testing.  No other masses lymph adenopathy or skin changes noted in the shoulder girdle region.  He does have limited abduction and external rotation on the right compared to the left with some coarse grinding with passive range of motion.  No other masses lymph adenopathy or skin changes noted in the shoulder girdle region.  No acromioclavicular joint tenderness is noted.  No Popeye deformity noted.  Specialty Comments:  No specialty comments available.  Imaging: No results found.   PMFS History: Patient Active Problem List   Diagnosis Date Noted  . New onset type 2 diabetes mellitus ([place]) 07/16/2017  . Hematoma of right thigh 09/13/2015  .  Laceration of lower leg with complication 53/29/9242  . Non compliance w medication regimen 11/20/2014  . Gout 08/13/2012  . HTN (hypertension) 08/13/2012  . Erectile dysfunction 08/13/2012  . Hypercholesteremia 08/13/2012   Past Medical History:  Diagnosis Date  . Diabetes ([place])   . Gout   . Hyperlipemia   . Hypertension     Family History  Problem Relation Age of Onset  . Diabetes Mother   . Other Father 74       Work accident  . Prostate cancer Father     Past Surgical History:  Procedure Laterality Date  . COLONOSCOPY    . I&D EXTREMITY Left 11/14/2016   Procedure: IRRIGATION AND DEBRIDEMENT LEFT INDEX FINGER;  Surgeon: Guy Planas, MD;  Location: [place];  Service: Orthopedics;  Laterality: Left;   Social History   Occupational History  . forklift driver    Social History Main Topics  . Smoking status: Never Smoker  . Smokeless tobacco: Never Used  . Alcohol use Yes     Comment: 3-4 beers daily  . Drug use: No  . Sexual activity: No

## 2017-09-30 ENCOUNTER — Encounter: Payer: Self-pay | Admitting: Gastroenterology

## 2017-09-30 ENCOUNTER — Ambulatory Visit (AMBULATORY_SURGERY_CENTER): Payer: Self-pay | Admitting: *Deleted

## 2017-09-30 VITALS — Ht 71.0 in | Wt 217.0 lb

## 2017-09-30 DIAGNOSIS — Z1211 Encounter for screening for malignant neoplasm of colon: Secondary | ICD-10-CM

## 2017-09-30 MED ORDER — NA SULFATE-K SULFATE-MG SULF 17.5-3.13-1.6 GM/177ML PO SOLN: 1.0000 [IU] | Freq: Once | ORAL | 0 refills | Status: AC

## 2017-09-30 NOTE — Progress Notes (Signed)
No egg or soy allergy known to patient  No issues with past sedation with any surgeries  or procedures, no intubation problems  No diet pills per patient No home 02 use per patient  No blood thinners per patient  Pt denies issues with constipation  No A fib or A flutter  EMMI video sent to pt's e mail  

## 2017-10-03 ENCOUNTER — Ambulatory Visit: Payer: Self-pay | Admitting: Family Medicine

## 2017-10-04 ENCOUNTER — Encounter: Payer: Self-pay | Admitting: Family Medicine

## 2017-10-04 ENCOUNTER — Ambulatory Visit (INDEPENDENT_AMBULATORY_CARE_PROVIDER_SITE_OTHER): Payer: BLUE CROSS/BLUE SHIELD | Admitting: Family Medicine

## 2017-10-04 VITALS — BP 132/82 | HR 66 | Temp 98.0°F | Resp 16 | Ht 71.26 in | Wt 212.0 lb

## 2017-10-04 DIAGNOSIS — E782 Mixed hyperlipidemia: Secondary | ICD-10-CM | POA: Diagnosis not present

## 2017-10-04 DIAGNOSIS — Z794 Long term (current) use of insulin: Secondary | ICD-10-CM | POA: Diagnosis not present

## 2017-10-04 DIAGNOSIS — I10 Essential (primary) hypertension: Secondary | ICD-10-CM

## 2017-10-04 DIAGNOSIS — E785 Hyperlipidemia, unspecified: Secondary | ICD-10-CM | POA: Diagnosis not present

## 2017-10-04 DIAGNOSIS — Z23 Encounter for immunization: Secondary | ICD-10-CM

## 2017-10-04 DIAGNOSIS — E119 Type 2 diabetes mellitus without complications: Secondary | ICD-10-CM

## 2017-10-04 DIAGNOSIS — Z1159 Encounter for screening for other viral diseases: Secondary | ICD-10-CM | POA: Diagnosis not present

## 2017-10-04 MED ORDER — GLIPIZIDE 5 MG PO TABS: 5.0000 mg | ORAL_TABLET | Freq: Two times a day (BID) | ORAL | 2 refills | Status: DC

## 2017-10-04 MED ORDER — AMLODIPINE BESYLATE 5 MG PO TABS: 5.0000 mg | ORAL_TABLET | Freq: Every day | ORAL | 1 refills | Status: DC

## 2017-10-04 MED ORDER — ATORVASTATIN CALCIUM 40 MG PO TABS: 40.0000 mg | ORAL_TABLET | Freq: Every day | ORAL | 1 refills | Status: DC

## 2017-10-04 MED ORDER — TELMISARTAN-HCTZ 80-12.5 MG PO TABS: 1.0000 | ORAL_TABLET | Freq: Every day | ORAL | 1 refills | Status: DC

## 2017-10-04 NOTE — Patient Instructions (Addendum)
If any more symptomatic low blood sugars - stop glipizide. Make sure to eat meals regularly and take glipizide with the meal. No change in metfomin for now.   I will check cholesterol levels today. Continue atorvastatin every day.   Pneumonia vaccine given. Schedule eye doctor appointment. Let me know if numbers needed.   Recheck in 3 months, sooner if needed.   IF you received an x-ray today, you will receive an invoice from [facility]. Please contact [facility] at [phone] with questions or concerns regarding your invoice.   IF you received labwork today, you will receive an invoice from [facility]. Please contact [facility] at [phone] with questions or concerns regarding your invoice.   Our billing staff will not be able to assist you with questions regarding bills from these companies.  You will be contacted with the lab results as soon as they are available. The fastest way to get your results is to activate your My Chart account. Instructions are located on the last page of this paperwork. If you have not heard from [place] regarding the results in 2 weeks, please contact this office.

## 2017-10-04 NOTE — Progress Notes (Addendum)
Subjective:  By signing Guy name below, I, Guy Horton, attest that this documentation has been prepared under the direction and in the presence of Guy Agreste, MD Electronically Signed: Ladene Horton, ED Scribe 10/04/2017 at 10:57 AM.   Patient ID: Guy Horton, male    DOB: 1961-04-15, 56 y.o.   MRN: [id number]  Chief Complaint  Patient presents with  . Diabetes    1 month follow-up  . Hemorrhoids   HPI Guy Horton is a 56 y.o. male who presents to Primary Care at [facility] for follow-up. Pt is fasting at this visit.  DM See last visit 9/25. Continued on metformin 1000 mg bid. Home glucose readings were much improved so decreased glipizide to 5 mg bid. Plan to repeat A1C today. Pt reports average glucose readings of 110-120, with his lowest reading in the 90s. He does report 1 symptomatic low last week, although he did not check his glucose, where he reports feeling "shaky" and reports diaphoresis. Pt reports only having a few crackers that morning until he had a candy bar later that day and finally checked his glucose with a reading of 150. He reports having 1-2 beers while watching football games. Denies cp, sob, light-headedness, dizziness.  Hyperlipidemia Lab Results  Component Value Date   CHOL 447 (H) 07/16/2017   HDL NOT REPORTED DUE TO HIGH TRIGLYCERIDES 07/16/2017   LDLCALC UNABLE TO CALCULATE IF TRIGLYCERIDE OVER 400 mg/dL 07/16/2017   TRIG 3,336 (H) 07/16/2017   CHOLHDL NOT REPORTED DUE TO HIGH TRIGLYCERIDES 07/16/2017   Lab Results  Component Value Date   ALT 18 07/09/2017   AST 16 07/09/2017   ALKPHOS 87 07/09/2017   BILITOT 0.7 07/09/2017  Elevated triglycerides thought to be due in part to DM. He was off Lipitor at last visit so testing was deferred. Restarted Lipitor and planned to recheck for medications today.  Hemorrhoids Suspected thrombosed hemorrhoid at last visit in September but incision was deferred as pain was improving. Anusol cream was  prescribed. Planning for screening colonoscopy with [facility]. Hemorrhoids have resolved. Colonoscopy is scheduled for 11/5.  Immunizations Immunization History  Administered Date(s) Administered  . Influenza,inj,Quad PF,6+ Mos 09/27/2015  . Tdap 03/18/2012  Pt is due for pneumovax due to h/o DM.  Hep C screening: agrees to have screening at this visit   Health Maintenance  Pt has not been seen by optho.   Patient Active Problem List   Diagnosis Date Noted  . New onset type 2 diabetes mellitus ([place]) 07/16/2017  . Hematoma of right thigh 09/13/2015  . Laceration of lower leg with complication 64/40/3474  . Non compliance w medication regimen 11/20/2014  . Gout 08/13/2012  . HTN (hypertension) 08/13/2012  . Erectile dysfunction 08/13/2012  . Hypercholesteremia 08/13/2012   Past Medical History:  Diagnosis Date  . Arthritis   . Diabetes ([place])   . Gout   . Hyperlipemia   . Hypertension    Past Surgical History:  Procedure Laterality Date  . I&D EXTREMITY Left 11/14/2016   Procedure: IRRIGATION AND DEBRIDEMENT LEFT INDEX FINGER;  Surgeon: [name], MD;  Location: [facility];  Service: Orthopedics;  Laterality: Left;   No Known Allergies Prior to Admission medications   Medication Sig Start Date End Date Taking? Authorizing Provider  allopurinol (ZYLOPRIM) 100 MG tablet TAKE 2 TABLETS BY MOUTH TWICE A DAY 07/09/17   Guy Agreste, MD  amLODipine (NORVASC) 5 MG tablet Take 1 tablet (5 mg total) by mouth daily.  07/09/17   Guy Agreste, MD  atorvastatin (LIPITOR) 40 MG tablet Take 1 tablet (40 mg total) by mouth daily. 07/09/17   Guy Agreste, MD  Blood Glucose Monitoring Suppl (ONETOUCH VERIO) w/Device KIT  07/18/17   [provider]  Blood Glucose Monitoring Suppl (TRUE METRIX METER) DEVI 1 Device by Does not apply route 4 (four) times daily. 07/17/17   [name], MD  docusate sodium (COLACE) 50 MG capsule Take 1 capsule (50 mg total) by mouth 2 (two)  times daily. 07/08/17   Tasia Catchings, Amy V, [name]  glipiZIDE (GLUCOTROL) 5 MG tablet Take 1 tablet (5 mg total) by mouth 2 (two) times daily. 09/03/17 09/03/18  Guy Agreste, MD  glucose blood (TRUE METRIX BLOOD GLUCOSE TEST) test strip Use as instructed 07/17/17   [name], MD  hydrocortisone (ANUSOL-HC) 2.5 % rectal cream Place 1 application rectally 2 (two) times daily. 09/03/17   Guy Agreste, MD  ibuprofen (ADVIL,MOTRIN) 600 MG tablet Take 1 tablet (600 mg total) by mouth every 6 (six) hours as needed. 08/28/17   Joy, Shawn C, [name]  metFORMIN (GLUCOPHAGE) 1000 MG tablet Take 1 tablet (1,000 mg total) by mouth 2 (two) times daily with a meal. 09/03/17   Guy Agreste, MD  telmisartan-hydrochlorothiazide (MICARDIS HCT) 80-12.5 MG tablet Take 1 tablet by mouth daily. 07/09/17   Guy Agreste, MD  TRUEPLUS LANCETS 28G MISC 1 each by Does not apply route 4 (four) times daily. 07/17/17   [name], MD   Social History   Social History  . Marital status: Married    Spouse name: N/A  . Number of children: N/A  . Years of education: N/A   Occupational History  . forklift driver    Social History Main Topics  . Smoking status: Never Smoker  . Smokeless tobacco: Never Used  . Alcohol use Yes     Comment: 3-4 beers daily  . Drug use: No  . Sexual activity: No   Other Topics Concern  . Not on file   Social History Narrative  . No narrative on file   Review of Systems  Constitutional: Negative for fatigue and unexpected weight change.  Eyes: Negative for visual disturbance.  Respiratory: Negative for cough, chest tightness and shortness of breath.   Cardiovascular: Negative for chest pain, palpitations and leg swelling.  Gastrointestinal: Negative for abdominal pain and blood in stool.  Neurological: Negative for dizziness, light-headedness and headaches.      Objective:   Physical Exam  Constitutional: He is oriented to person, place, and time. He appears  well-developed and well-nourished.  HENT:  Head: Normocephalic and atraumatic.  Eyes: Pupils are equal, round, and reactive to light. EOM are normal.  Neck: No JVD present. Carotid bruit is not present.  Cardiovascular: Normal rate, regular rhythm and normal heart sounds.   No murmur heard. Pulmonary/Chest: Effort normal and breath sounds normal. He has no rales.  Musculoskeletal: He exhibits no edema.  Neurological: He is alert and oriented to person, place, and time.  Skin: Skin is warm and dry.  Psychiatric: He has a normal mood and affect.  Vitals reviewed.  Vitals:   10/04/17 1043  BP: 132/82  Pulse: 66  Resp: 16  Temp: 98 F (36.7 C)  TempSrc: Oral  SpO2: 98%  Weight: 212 lb (96.2 kg)  Height: 5' 11.26" (1.81 m)      Assessment & Plan:   Guy Horton is a 56  y.o. male Type 2 diabetes mellitus without complication, with long-term current use of insulin ([place]) - Plan: Hemoglobin A1c, Type 2 diabetes mellitus without complication, without long-term current use of insulin ([facility]) - Plan: glipiZIDE (GLUCOTROL) 5 MG tablet  -Suspected  episode of hypoglycemia but was at time he had not eaten a full meal. Stressed importance of regular meals when taking glipizide  - if recurrence of symptomatic hyperglycemia with regular meals, stop glipizide. Continue metformin same dose for now, check A1c, recheck 3 months  -Stressed importance of ophthalmology follow-up, Pneumovax given today.  Mixed hyperlipidemia - Plan: Lipid panel, Comprehensive metabolic panel Hyperlipidemia, unspecified hyperlipidemia type - Plan: atorvastatin (LIPITOR) 40 MG tablet  -  -Now back on atorvastatin, tolerating. Check labs  Need for hepatitis C screening test - Plan: Hepatitis C antibody  Need for prophylactic vaccination against Streptococcus pneumoniae (pneumococcus) - Plan: Pneumococcal polysaccharide vaccine 23-valent greater than or equal to 2yo subcutaneous/IM  Essential hypertension - Plan:  telmisartan-hydrochlorothiazide (MICARDIS HCT) 80-12.5 MG tablet, amLODipine (NORVASC) 5 MG tablet  - Overall stable, ideal goal less than 130/80 with diabetes, no changes for now.    Meds ordered this encounter  Medications  . glipiZIDE (GLUCOTROL) 5 MG tablet    Sig: Take 1 tablet (5 mg total) by mouth 2 (two) times daily.    Dispense:  60 tablet    Refill:  2  . telmisartan-hydrochlorothiazide (MICARDIS HCT) 80-12.5 MG tablet    Sig: Take 1 tablet by mouth daily.    Dispense:  90 tablet    Refill:  1  . amLODipine (NORVASC) 5 MG tablet    Sig: Take 1 tablet (5 mg total) by mouth daily.    Dispense:  90 tablet    Refill:  1  . atorvastatin (LIPITOR) 40 MG tablet    Sig: Take 1 tablet (40 mg total) by mouth daily.    Dispense:  90 tablet    Refill:  1   Patient Instructions   If any more symptomatic low blood sugars - stop glipizide. Make sure to eat meals regularly and take glipizide with the meal. No change in metfomin for now.   I will check cholesterol levels today. Continue atorvastatin every day.   Pneumonia vaccine given. Schedule eye doctor appointment. Let me know if numbers needed.   Recheck in 3 months, sooner if needed.   IF you received an x-ray today, you will receive an invoice from [facility]. Please contact [facility]iology at [phone] with questions or concerns regarding your invoice.   IF you received labwork today, you will receive an invoice from [facility]. Please contact [facility] at [phone] with questions or concerns regarding your invoice.   Our billing staff will not be able to assist you with questions regarding bills from these companies.  You will be contacted with the lab results as soon as they are available. The fastest way to get your results is to activate your Guy Chart account. Instructions are located on the last page of this paperwork. If you have not heard from [place] regarding the results in 2 weeks, please contact this  office.       I personally performed the services described in this documentation, which was scribed in Guy presence. The recorded information has been reviewed and considered for accuracy and completeness, addended by me as needed, and agree with information above.  Signed,   [name], MD Primary Care at [place].  10/04/17 11:13 AM

## 2017-10-05 LAB — COMPREHENSIVE METABOLIC PANEL
ALK PHOS: 54 IU/L (ref 39–117)
ALT: 16 IU/L (ref 0–44)
AST: 17 IU/L (ref 0–40)
Albumin/Globulin Ratio: 1.7 (ref 1.2–2.2)
Albumin: 4.8 g/dL (ref 3.5–5.5)
BILIRUBIN TOTAL: 0.5 mg/dL (ref 0.0–1.2)
BUN/Creatinine Ratio: 18 (ref 9–20)
BUN: 20 mg/dL (ref 6–24)
CHLORIDE: 99 mmol/L (ref 96–106)
CO2: 23 mmol/L (ref 20–29)
Calcium: 10 mg/dL (ref 8.7–10.2)
Creatinine, Ser: 1.12 mg/dL (ref 0.76–1.27)
GFR calc Af Amer: 86 mL/min/{1.73_m2} (ref >59)
GFR calc non Af Amer: 74 mL/min/{1.73_m2} (ref >59)
GLUCOSE: 91 mg/dL (ref 65–99)
Globulin, Total: 2.8 g/dL (ref 1.5–4.5)
Potassium: 4.2 mmol/L (ref 3.5–5.2)
Sodium: 139 mmol/L (ref 134–144)
Total Protein: 7.6 g/dL (ref 6.0–8.5)

## 2017-10-05 LAB — LIPID PANEL
CHOL/HDL RATIO: 3.7 ratio (ref 0.0–5.0)
Cholesterol, Total: 170 mg/dL (ref 100–199)
HDL: 46 mg/dL (ref >39)
LDL Calculated: 101 mg/dL — ABNORMAL HIGH (ref 0–99)
Triglycerides: 113 mg/dL (ref 0–149)
VLDL CHOLESTEROL CAL: 23 mg/dL (ref 5–40)

## 2017-10-05 LAB — HEPATITIS C ANTIBODY: Hep C Virus Ab: <0.1 s/co ratio (ref 0.0–0.9)

## 2017-10-05 LAB — HEMOGLOBIN A1C
Est. average glucose Bld gHb Est-mCnc: 154 mg/dL
HEMOGLOBIN A1C: 7 % — AB (ref 4.8–5.6)

## 2017-10-14 ENCOUNTER — Encounter: Payer: Self-pay | Admitting: *Deleted

## 2017-10-14 ENCOUNTER — Encounter: Payer: Self-pay | Admitting: Gastroenterology

## 2017-10-14 ENCOUNTER — Ambulatory Visit (AMBULATORY_SURGERY_CENTER): Payer: BLUE CROSS/BLUE SHIELD | Admitting: Gastroenterology

## 2017-10-14 VITALS — BP 123/80 | HR 67 | Temp 99.1°F | Resp 11 | Ht 71.0 in | Wt 212.0 lb

## 2017-10-14 DIAGNOSIS — Z1212 Encounter for screening for malignant neoplasm of rectum: Secondary | ICD-10-CM | POA: Diagnosis not present

## 2017-10-14 DIAGNOSIS — D120 Benign neoplasm of cecum: Secondary | ICD-10-CM | POA: Diagnosis not present

## 2017-10-14 DIAGNOSIS — Z1211 Encounter for screening for malignant neoplasm of colon: Secondary | ICD-10-CM

## 2017-10-14 MED ORDER — SODIUM CHLORIDE 0.9 % IV SOLN: 500.0000 mL | INTRAVENOUS | Status: DC

## 2017-10-14 NOTE — Progress Notes (Signed)
Called to room to assist during endoscopic procedure.  Patient ID and intended procedure confirmed with present staff. Received instructions for my participation in the procedure from the performing physician.  

## 2017-10-14 NOTE — Op Note (Signed)
[place] Patient Name: Guy Horton Procedure Date: 10/14/2017 10:33 AM MRN: [id number] Endoscopist: Ladene Artist , MD Age: 56 Referring MD:  Date of Birth: 01-28-1961 Gender: Male Account #: [id number] Procedure:                Colonoscopy Indications:              Screening for colorectal malignant neoplasm Medicines:                Monitored Anesthesia Care Procedure:                Pre-Anesthesia Assessment:                           - Prior to the procedure, a History and Physical                            was performed, and patient medications and                            allergies were reviewed. The patient's tolerance of                            previous anesthesia was also reviewed. The risks                            and benefits of the procedure and the sedation                            options and risks were discussed with the patient.                            All questions were answered, and informed consent                            was obtained. Prior Anticoagulants: The patient has                            taken no previous anticoagulant or antiplatelet                            agents. ASA Grade Assessment: II - A patient with                            mild systemic disease. After reviewing the risks                            and benefits, the patient was deemed in                            satisfactory condition to undergo the procedure.                           After obtaining informed consent, the colonoscope  was passed under direct vision. Throughout the                            procedure, the patient's blood pressure, pulse, and                            oxygen saturations were monitored continuously. The                            Model [id number]-[id number]) scope was introduced                            through the anus and advanced to the the cecum,                            identified by  appendiceal orifice and ileocecal                            valve. The ileocecal valve, appendiceal orifice,                            and rectum were photographed. The quality of the                            bowel preparation was good. The colonoscopy was                            performed without difficulty. The patient tolerated                            the procedure well. Scope In: 10:45:30 AM Scope Out: 10:57:36 AM Scope Withdrawal Time: 0 hours 9 minutes 38 seconds  Total Procedure Duration: 0 hours 12 minutes 6 seconds  Findings:                 The perianal and digital rectal examinations were                            normal.                           A 8 mm polyp was found in the cecum. The polyp was                            sessile. The polyp was removed with a hot snare.                            Resection and retrieval were complete.                           Scattered small-mouthed diverticula were found in                            the right colon. There was no evidence of  diverticular bleeding.                           Internal hemorrhoids were found during                            retroflexion. The hemorrhoids were medium-sized and                            Grade I (internal hemorrhoids that do not prolapse).                           The exam was otherwise without abnormality on                            direct and retroflexion views. Complications:            No immediate complications. Estimated blood loss:                            None. Estimated Blood Loss:     Estimated blood loss: none. Impression:               - One 8 mm polyp in the cecum, removed with a hot                            snare. Resected and retrieved.                           - Mild diverticulosis in the right colon. There was                            no evidence of diverticular bleeding.                           - Internal hemorrhoids.                            - The examination was otherwise normal on direct                            and retroflexion views. Recommendation:           - Repeat colonoscopy in 5 years for surveillance if                            polyp is precancerous, otherwise 10 years.                           - Patient has a contact number available for                            emergencies. The signs and symptoms of potential                            delayed complications were discussed with the  patient. Return to normal activities tomorrow.                            Written discharge instructions were provided to the                            patient.                           - Resume previous diet.                           - Continue present medications.                           - Await pathology results.                           - No aspirin, ibuprofen, naproxen, or other                            non-steroidal anti-inflammatory drugs for 2 weeks                            after polyp removal. Ladene Artist, MD 10/14/2017 11:00:59 AM This report has been signed electronically.

## 2017-10-14 NOTE — Progress Notes (Signed)
Report given to PACU, vss 

## 2017-10-14 NOTE — Patient Instructions (Signed)
**   Handouts given on polyps, diverticulosis, and hemorrhoids **   YOU HAD AN ENDOSCOPIC PROCEDURE TODAY AT THE [facility]:   Refer to the procedure report that was given to you for any specific questions about what was found during the examination.  If the procedure report does not answer your questions, please call your gastroenterologist to clarify.  If you requested that your care partner not be given the details of your procedure findings, then the procedure report has been included in a sealed envelope for you to review at your convenience later.  YOU SHOULD EXPECT: Some feelings of bloating in the abdomen. Passage of more gas than usual.  Walking can help get rid of the air that was put into your GI tract during the procedure and reduce the bloating. If you had a lower endoscopy (such as a colonoscopy or flexible sigmoidoscopy) you may notice spotting of blood in your stool or on the toilet paper. If you underwent a bowel prep for your procedure, you may not have a normal bowel movement for a few days.  Please Note:  You might notice some irritation and congestion in your nose or some drainage.  This is from the oxygen used during your procedure.  There is no need for concern and it should clear up in a day or so.  SYMPTOMS TO REPORT IMMEDIATELY:   Following lower endoscopy (colonoscopy or flexible sigmoidoscopy):  Excessive amounts of blood in the stool  Significant tenderness or worsening of abdominal pains  Swelling of the abdomen that is new, acute  Fever of 100F or higher  For urgent or emergent issues, a gastroenterologist can be reached at any hour by calling [phone].   DIET:  We do recommend a small meal at first, but then you may proceed to your regular diet.  Drink plenty of fluids but you should avoid alcoholic beverages for 24 hours.  ACTIVITY:  You should plan to take it easy for the rest of today and you should NOT DRIVE or use heavy machinery until  tomorrow (because of the sedation medicines used during the test).    FOLLOW UP: Our staff will call the number listed on your records the next business day following your procedure to check on you and address any questions or concerns that you may have regarding the information given to you following your procedure. If we do not reach you, we will leave a message.  However, if you are feeling well and you are not experiencing any problems, there is no need to return our call.  We will assume that you have returned to your regular daily activities without incident.  If any biopsies were taken you will be contacted by phone or by letter within the next 1-3 weeks.  Please call us at [phone] if you have not heard about the biopsies in 3 weeks.    SIGNATURES/CONFIDENTIALITY: You and/or your care partner have signed paperwork which will be entered into your electronic medical record.  These signatures attest to the fact that that the information above on your After Visit Summary has been reviewed and is understood.  Full responsibility of the confidentiality of this discharge information lies with you and/or your care-partner. 

## 2017-10-14 NOTE — Progress Notes (Signed)
Letter sent.

## 2017-10-14 NOTE — Progress Notes (Signed)
Pt's states no medical or surgical changes since previsit or office visit. 

## 2017-10-15 ENCOUNTER — Telehealth: Payer: Self-pay | Admitting: *Deleted

## 2017-10-15 NOTE — Telephone Encounter (Signed)
  Follow up Call-  Call back number 10/14/2017  Post procedure Call Back phone  # [phone]  Permission to leave phone message Yes  Some recent data might be hidden     Patient questions:  Do you have a fever, pain , or abdominal swelling? No. Pain Score  0 *  Have you tolerated food without any problems? Yes.    Have you been able to return to your normal activities? Yes.    Do you have any questions about your discharge instructions: Diet   No. Medications  No. Follow up visit  No.  Do you have questions or concerns about your Care? No.  Actions: * If pain score is 4 or above: No action needed, pain <4.

## 2017-10-22 ENCOUNTER — Encounter: Payer: Self-pay | Admitting: Gastroenterology

## 2017-10-24 ENCOUNTER — Telehealth (INDEPENDENT_AMBULATORY_CARE_PROVIDER_SITE_OTHER): Payer: Self-pay

## 2017-10-24 NOTE — Telephone Encounter (Signed)
Left pt a message to call me back to see if we are to be filing wc for his October visit

## 2017-12-05 ENCOUNTER — Other Ambulatory Visit: Payer: Self-pay | Admitting: *Deleted

## 2017-12-05 ENCOUNTER — Telehealth: Payer: Self-pay | Admitting: Family Medicine

## 2017-12-05 MED ORDER — GLUCOSE BLOOD VI STRP: ORAL_STRIP | 2 refills | Status: AC

## 2017-12-05 NOTE — Telephone Encounter (Signed)
Copied from [place] [id number]. Topic: Quick Communication - Rx Refill/Question >> Dec 05, 2017  1:30 PM Marin Olp L wrote: Has the patient contacted their pharmacy? Yes.     (Agent: If no, request that the patient contact the pharmacy for the refill.)   Preferred Pharmacy (with phone number or street name[id number] Please be advised that RX refills may take up to 3 business days. We ask that you follow-up with your pharmacy. Refill Blood Glucose Monitoring Suppl (ONETOUCH VERIO) w/Device KIT ([facility] will no longer carry accu strip)

## 2017-12-05 NOTE — Telephone Encounter (Signed)
Copied from [place] [id number]. Topic: Quick Communication - Rx Refill/Question >> Dec 05, 2017  1:30 PM Marin Olp L wrote: Has the patient contacted their pharmacy? Yes.     (Agent: If no, request that the patient contact the pharmacy for the refill.)   Preferred Pharmacy (with phone number or street name): [id number] Please be advised that RX refills may take up to 3 business days. We ask that you follow-up with your pharmacy. Refill Blood Glucose Monitoring Suppl (ONETOUCH VERIO) w/Device KIT ([facility] will no longer carry accu strip)

## 2018-03-17 ENCOUNTER — Other Ambulatory Visit: Payer: Self-pay | Admitting: Family Medicine

## 2018-03-17 DIAGNOSIS — E119 Type 2 diabetes mellitus without complications: Secondary | ICD-10-CM

## 2018-07-14 ENCOUNTER — Other Ambulatory Visit: Payer: Self-pay | Admitting: Family Medicine

## 2018-07-14 DIAGNOSIS — I10 Essential (primary) hypertension: Secondary | ICD-10-CM

## 2018-07-15 ENCOUNTER — Other Ambulatory Visit: Payer: Self-pay

## 2018-07-15 NOTE — Telephone Encounter (Signed)
Refill request for:  Telmisartan 80-12.5; LRF 10/04/17; # 90; RF x 1  Amlodipine; LRF 10/04/17; #90; RF x 1  Last office visit 10/04/17 (was advised to f/u in 3 mos.)  PCP Dr. Carlota Raspberry  Will give refill for 1 mo. supply, and advise pharmacy that pt. needs office visit

## 2018-07-26 ENCOUNTER — Telehealth: Payer: Self-pay | Admitting: Family Medicine

## 2018-07-26 DIAGNOSIS — I10 Essential (primary) hypertension: Secondary | ICD-10-CM

## 2018-07-28 ENCOUNTER — Telehealth: Payer: Self-pay | Admitting: Family Medicine

## 2018-07-28 DIAGNOSIS — E119 Type 2 diabetes mellitus without complications: Secondary | ICD-10-CM

## 2018-07-28 NOTE — Telephone Encounter (Signed)
Pt would like to know why his rx was denied, please leave message if no answer cb is [phone]

## 2018-07-29 ENCOUNTER — Other Ambulatory Visit: Payer: Self-pay | Admitting: *Deleted

## 2018-07-29 DIAGNOSIS — I10 Essential (primary) hypertension: Secondary | ICD-10-CM

## 2018-07-29 MED ORDER — TELMISARTAN-HCTZ 80-12.5 MG PO TABS: 1.0000 | ORAL_TABLET | Freq: Every day | ORAL | 0 refills | Status: DC

## 2018-07-29 MED ORDER — AMLODIPINE BESYLATE 5 MG PO TABS: 5.0000 mg | ORAL_TABLET | Freq: Every day | ORAL | 0 refills | Status: DC

## 2018-07-29 NOTE — Telephone Encounter (Signed)
Spoke with Guy Horton at [facility] who states that the prescription for Metformin 1000mg  tab is on file but the insurance is requiring a 3 month supply and not a 30 day supply. Pharmacy will need the prescription to be resent for a 3 month supply in order for the medication to be covered by insurance.

## 2018-07-29 NOTE — Telephone Encounter (Signed)
Spoke with patient advised 30 days sent in to pharmacy and he will need to have an office visit for any additional refills. Patient has already picked up metformin. Per patient.  He stated he only needed his bp meds

## 2018-08-22 ENCOUNTER — Other Ambulatory Visit: Payer: Self-pay | Admitting: Family Medicine

## 2018-08-22 DIAGNOSIS — I10 Essential (primary) hypertension: Secondary | ICD-10-CM

## 2018-08-22 NOTE — Telephone Encounter (Signed)
Patient called, left VM to return call to the office to schedule an appointment in order to receive medication refills. Requested medications refilled x 15 days per protocol.

## 2018-10-08 ENCOUNTER — Other Ambulatory Visit: Payer: Self-pay | Admitting: Family Medicine

## 2018-10-08 DIAGNOSIS — Z8739 Personal history of other diseases of the musculoskeletal system and connective tissue: Secondary | ICD-10-CM

## 2018-10-08 DIAGNOSIS — I10 Essential (primary) hypertension: Secondary | ICD-10-CM

## 2018-10-08 MED ORDER — AMLODIPINE BESYLATE 5 MG PO TABS: 5.0000 mg | ORAL_TABLET | Freq: Every day | ORAL | 0 refills | Status: DC

## 2018-10-08 MED ORDER — TELMISARTAN-HCTZ 80-12.5 MG PO TABS: 1.0000 | ORAL_TABLET | Freq: Every day | ORAL | 0 refills | Status: DC

## 2018-10-08 MED ORDER — ALLOPURINOL 100 MG PO TABS
ORAL_TABLET | ORAL | 0 refills | Status: DC
Start: 2018-10-08 — End: 2018-11-18

## 2018-10-08 NOTE — Telephone Encounter (Signed)
Pt also requesting Acetaminopehn 650 mg but not on active med list

## 2018-10-08 NOTE — Telephone Encounter (Signed)
Patient has appointment - 10/21/18  Courtesy refill given. Last annual exam 09/2017. Requested Prescriptions  Pending Prescriptions Disp Refills  . telmisartan-hydrochlorothiazide (MICARDIS HCT) 80-12.5 MG tablet 15 tablet 0    Sig: Take 1 tablet by mouth daily. Has not been in or made appointment, may deny next request.     Cardiovascular: ARB + Diuretic Combos Failed - 10/08/2018  9:06 AM      Failed - K in normal range and within 180 days    Potassium  Date Value Ref Range Status  10/04/2017 4.2 3.5 - 5.2 mmol/L Final         Failed - Na in normal range and within 180 days    Sodium  Date Value Ref Range Status  10/04/2017 139 134 - 144 mmol/L Final         Failed - Cr in normal range and within 180 days    Creat  Date Value Ref Range Status  08/27/2015 1.35 (H) 0.70 - 1.33 mg/dL Final   Creatinine, Ser  Date Value Ref Range Status  10/04/2017 1.12 0.76 - 1.27 mg/dL Final         Failed - Ca in normal range and within 180 days    Calcium  Date Value Ref Range Status  10/04/2017 10.0 8.7 - 10.2 mg/dL Final         Failed - Valid encounter within last 6 months    Recent Outpatient Visits          1 year ago Type 2 diabetes mellitus without complication, with long-term current use of insulin ([place])   [facility], Ranell Patrick, MD   1 year ago Type 2 diabetes mellitus without complication, without long-term current use of insulin [facility])   [facility] at [facility], Ranell Patrick, MD   1 year ago New onset type 2 diabetes mellitus [facility])   [facility], Ranell Patrick, MD   1 year ago Type 2 diabetes mellitus with hyperglycemia, without long-term current use of insulin [facility])   [facility], Ranell Patrick, MD   1 year ago Hyperglycemia   [facility] [place], MD      Future Appointments            In 1 week Leonie Douglas, [name] Primary [facility] at [place], [place] - Patient is not pregnant       Passed - Last BP in normal range    BP Readings from Last 1 Encounters:  10/14/17 123/80       . amLODipine (NORVASC) 5 MG tablet 15 tablet 0    Sig: Take 1 tablet (5 mg total) by mouth daily. Has not been in or made appointment, may deny next request.     Cardiovascular:  Calcium Channel Blockers Failed - 10/08/2018  9:06 AM      Failed - Valid encounter within last 6 months    Recent Outpatient Visits          1 year ago Type 2 diabetes mellitus without complication, with long-term current use of insulin [facility])   [facility], Ranell Patrick, MD   1 year ago Type 2 diabetes mellitus without complication, without long-term current use of insulin [facility])   [facility], Ranell Patrick, MD   1 year ago New onset type 2 diabetes mellitus [facility])   Primary  [facility] at [facility], Ranell Patrick, MD   1 year ago Type 2 diabetes mellitus with hyperglycemia, without long-term current use of insulin [facility])   [facility], Ranell Patrick, MD   1 year ago Hyperglycemia   [facility] at [place], MD      Future Appointments            In 1 week Leonie Douglas, [name] Primary [facility] at [place], [place] BP in normal range    BP Readings from Last 1 Encounters:  10/14/17 123/80       . allopurinol (ZYLOPRIM) 100 MG tablet 30 tablet 0    Sig: TAKE 2 TABLETS BY MOUTH TWICE A DAY     Endocrinology:  Gout Agents Failed - 10/08/2018  9:06 AM      Failed - Uric Acid in normal range and within 360 days    Uric Acid  Date Value Ref Range Status  07/09/2017 6.4 3.7 - 8.6 mg/dL Final    Comment:               Therapeutic target for gout patients: <6.0         Failed - Cr in normal range and within 360 days    Creat  Date Value Ref Range Status  08/27/2015 1.35 (H) 0.70 - 1.33 mg/dL Final   Creatinine, Ser  Date Value Ref Range Status  10/04/2017 1.12 0.76 - 1.27 mg/dL Final         Failed - Valid encounter within  last 12 months    Recent Outpatient Visits          1 year ago Type 2 diabetes mellitus without complication, with long-term current use of insulin ([place])   [facility], Ranell Patrick, MD   1 year ago Type 2 diabetes mellitus without complication, without long-term current use of insulin [facility])   [facility] at [place] [facility], Ranell Patrick, MD   1 year ago New onset type 2 diabetes mellitus [facility])   [facility] at [place] [facility], Ranell Patrick, MD   1 year ago Type 2 diabetes mellitus with hyperglycemia, without long-term current use of insulin [facility])   [facility], Ranell Patrick, MD   1 year ago Hyperglycemia   [facility], Ranell Patrick, MD      Future Appointments            In 1 week Leonie Douglas, [name] Primary Care at [place], [facility]

## 2018-10-08 NOTE — Telephone Encounter (Signed)
Copied from [place] [id number]. Topic: Quick Communication - Rx Refill/Question >> Oct 08, 2018  8:57 AM Leward Quan A wrote: Medication: telmisartan-hydrochlorothiazide (MICARDIS HCT) 80-12.5 MG tablet,  amLODipine (NORVASC) 5 MG tablet , allopurinol (ZYLOPRIM) 100 MG tablet,  acetaminophen (TYLENOL) tablet 650 mg  Patient is all out of refills and has an appointment to see Dr on 10/21/18 appt. Is with [place]/ Carlota Raspberry had nothing till December. Need refill till appointment  Has the patient contacted their pharmacy? Yes.     Preferred Pharmacy (with phone number or street name): [id number] [place] [place] - [address] [phone] (Phone) [phone] (Fax)    Agent: Please be advised that RX refills may take up to 3 business days. We ask that you follow-up with your pharmacy.

## 2018-10-21 ENCOUNTER — Ambulatory Visit: Payer: BLUE CROSS/BLUE SHIELD | Admitting: Physician Assistant

## 2018-10-21 ENCOUNTER — Other Ambulatory Visit: Payer: Self-pay

## 2018-10-21 ENCOUNTER — Encounter: Payer: Self-pay | Admitting: Physician Assistant

## 2018-10-21 VITALS — BP 168/104 | HR 67 | Resp 20 | Ht 70.75 in | Wt 234.2 lb

## 2018-10-21 DIAGNOSIS — M19019 Primary osteoarthritis, unspecified shoulder: Secondary | ICD-10-CM | POA: Diagnosis not present

## 2018-10-21 DIAGNOSIS — E119 Type 2 diabetes mellitus without complications: Secondary | ICD-10-CM | POA: Diagnosis not present

## 2018-10-21 DIAGNOSIS — I10 Essential (primary) hypertension: Secondary | ICD-10-CM

## 2018-10-21 DIAGNOSIS — E785 Hyperlipidemia, unspecified: Secondary | ICD-10-CM

## 2018-10-21 LAB — POCT URINALYSIS DIP (MANUAL ENTRY)
Bilirubin, UA: NEGATIVE
Blood, UA: NEGATIVE
GLUCOSE UA: NEGATIVE mg/dL
Ketones, POC UA: NEGATIVE mg/dL
LEUKOCYTES UA: NEGATIVE
Nitrite, UA: NEGATIVE
SPEC GRAV UA: 1.02 (ref 1.010–1.025)
Urobilinogen, UA: 0.2 E.U./dL
pH, UA: 5 (ref 5.0–8.0)

## 2018-10-21 LAB — POCT GLYCOSYLATED HEMOGLOBIN (HGB A1C): Hemoglobin A1C: 7.1 % — AB (ref 4.0–5.6)

## 2018-10-21 MED ORDER — METFORMIN HCL 1000 MG PO TABS: ORAL_TABLET | ORAL | 1 refills | Status: DC

## 2018-10-21 MED ORDER — NAPROXEN 500 MG PO TABS: 500.0000 mg | ORAL_TABLET | Freq: Two times a day (BID) | ORAL | 0 refills | Status: DC

## 2018-10-21 MED ORDER — AMLODIPINE BESYLATE 5 MG PO TABS: 5.0000 mg | ORAL_TABLET | Freq: Every day | ORAL | 1 refills | Status: DC

## 2018-10-21 MED ORDER — ATORVASTATIN CALCIUM 40 MG PO TABS: 40.0000 mg | ORAL_TABLET | Freq: Every day | ORAL | 1 refills | Status: DC

## 2018-10-21 MED ORDER — TRAMADOL HCL 50 MG PO TABS: 50.0000 mg | ORAL_TABLET | Freq: Three times a day (TID) | ORAL | 0 refills | Status: DC | PRN

## 2018-10-21 MED ORDER — TELMISARTAN-HCTZ 80-25 MG PO TABS: 1.0000 | ORAL_TABLET | Freq: Every day | ORAL | 1 refills | Status: DC

## 2018-10-21 NOTE — Patient Instructions (Addendum)
In terms of elevated blood pressure, we are going to increase micardis dose. Continue amlodipine 5mg  daily. I would like you to check your blood pressure at least a couple times over the next week outside of the office and document these values. It is best if you check the blood pressure at different times in the day. Your goal is <140/90. Return in 2 weeks for reevaluation.  If you start to have chest pain, blurred vision, shortness of breath, severe headache, lower leg swelling, or nausea/vomiting please seek care immediately here or at the ED.    For diabetes, continue metformin twice daily.  For shoulder pain, use naproxen every 12 hours, extra strength tylenol every 6 hours, and tramadol for breakthrough pain. Orthopedics should contact you in 2 weeks. Thank you for letting me participate in your health and well being.  If you have lab work done today you will be contacted with your lab results within the next 2 weeks.  If you have not heard from [place] then please contact us. The fastest way to get your results is to register for My Chart.   IF you received an x-ray today, you will receive an invoice from [facility]. Please contact [facility] at [phone] with questions or concerns regarding your invoice.   IF you received labwork today, you will receive an invoice from [facility]. Please contact [facility] at [phone] with questions or concerns regarding your invoice.   Our billing staff will not be able to assist you with questions regarding bills from these companies.  You will be contacted with the lab results as soon as they are available. The fastest way to get your results is to activate your My Chart account. Instructions are located on the last page of this paperwork. If you have not heard from [place] regarding the results in 2 weeks, please contact this office.

## 2018-10-21 NOTE — Progress Notes (Signed)
MRN: [id number]  Subjective:   Guy Horton is a 57 y.o. male who presents for follow up of med refill for all chronic conditions and right shoulder pain x year.    -T2DM:Diagnosis was made 2018.  Patient is currently managed with Continued metformin '1000mg'$  BID which has been somewhat effective. Admits fair compliance, will often miss 2nd dose. Stopped glipizide '5mg'$  BID about 10 months ago. Denies adverse effects including metallic taste, hypoglycemia, nausea, vomiting.Patient is checking home blood sugars. Home blood sugar records: BGs range between 90 and 100. Current symptoms include none. Patient denies foot ulcerations, nausea, paresthesia of the feet, polydipsia, polyuria, visual disturbances and vomiting. Patient is checking their feet daily. No foot concerns. Last diabetic eye exam eye exam: never.   Has changed diet, eating salads and watches what he eats. Avoid sugars and sodas. 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 Drinks 6 pack beer every 2 days. Denies smoking. Known diabetic complications: none.Immunizations: Flu vaccine: 2018, pneumococal vaccine: 2018. Taking Lipitor dailiy.    -HTN: Currently managed with micardis 80-12.'5mg'$  and amlodipine '5mg'$  daily.  Patient is not checking blood pressure at home. Denies lightheadedness, dizziness, chronic headache, double vision, chest pain, shortness of breath, heart racing, palpitations, nausea, vomiting, abdominal pain, hematuria, lower leg swelling.   -Continues to have right shoulder pain. +limited ROM. Las had plain film on 08/18/18, showed advanced glenohumeral OA. Has had injections in the past with no real success. Hard to do his job. No new acute injryHas been taking bc arthritis pain med and hydrocodone, which helped. Denies nunbness, tingling, redness, and warmth.   Patient Active Problem List   Diagnosis Date Noted  . New onset type 2 diabetes mellitus ([place]) 07/16/2017  . Hematoma of right thigh 09/13/2015  . Laceration of lower leg with  complication 36/64/4034  . Non compliance w medication regimen 11/20/2014  . Gout 08/13/2012  . HTN (hypertension) 08/13/2012  . Erectile dysfunction 08/13/2012  . Hypercholesteremia 08/13/2012   Past Medical History:  Diagnosis Date  . Arthritis   . Diabetes ([place])   . Gout   . Hyperlipemia   . Hypertension     Social History   Socioeconomic History  . Marital status: Married    Spouse name: Not on file  . Number of children: 2  . Years of education: Not on file  . Highest education level: Not on file  Occupational History  . Occupation: Games developer  Social Needs  . Financial resource strain: Not on file  . Food insecurity:    Worry: Not on file    Inability: Not on file  . Transportation needs:    Medical: Not on file    Non-medical: Not on file  Tobacco Use  . Smoking status: Never Smoker  . Smokeless tobacco: Never Used  Substance and Sexual Activity  . Alcohol use: Yes    Comment: 3-4 beers daily  . Drug use: No  . Sexual activity: Yes  Lifestyle  . Physical activity:    Days per week: Not on file    Minutes per session: Not on file  . Stress: Not on file  Relationships  . Social connections:    Talks on phone: Not on file    Gets together: Not on file    Attends religious service: Not on file    Active member of club or organization: Not on file    Attends meetings of clubs or organizations: Not on file    Relationship status: Not  on file  . Intimate partner violence:    Fear of current or ex partner: Not on file    Emotionally abused: Not on file    Physically abused: Not on file    Forced sexual activity: Not on file  Other Topics Concern  . Not on file  Social History Narrative  . Not on file     Objective:   PHYSICAL EXAM BP (!) 168/104 (BP Location: Left Arm, Patient Position: Sitting, Cuff Size: Large)   Pulse 67   Resp 20   Ht 5' 10.75" (1.797 m)   Wt 234 lb 3.2 oz (106.2 kg)   SpO2 96%   BMI 32.90 kg/m    Physical Exam    Constitutional: He is oriented to person, place, and time. He appears well-developed and well-nourished. No distress.  HENT:  Head: Normocephalic and atraumatic.  Mouth/Throat: Uvula is midline, oropharynx is clear and moist and mucous membranes are normal. No tonsillar exudate.  Eyes: Pupils are equal, round, and reactive to light. Conjunctivae, EOM and lids are normal.  Neck: Normal range of motion.  Cardiovascular: Normal rate, regular rhythm, normal heart sounds and intact distal pulses.  Pulses:      Radial pulses are 2+ on the right side, and 2+ on the left side.  Pulmonary/Chest: Effort normal and breath sounds normal.  Abdominal: Soft. Normal appearance and bowel sounds are normal. There is no tenderness.  Musculoskeletal:       Right shoulder: He exhibits decreased range of motion (limited acitve ROM to ~70 degrees, passive ROM to ~120 degress), tenderness and crepitus. He exhibits no swelling and no effusion.       Left shoulder: Normal.       Right lower leg: He exhibits no edema.       Left lower leg: He exhibits no edema.  Neurological: He is alert and oriented to person, place, and time.  Sensation of BUE intact.   Skin: Skin is warm and dry.  Psychiatric: He has a normal mood and affect.  Vitals reviewed.   Diabetic Foot Exam - Simple   Simple Foot Form Visual Inspection See comments:  Yes Sensation Testing Intact to touch and monofilament testing bilaterally:  Yes Pulse Check Posterior Tibialis and Dorsalis pulse intact bilaterally:  Yes Comments Callus on great toe right foot      Results for orders placed or performed in visit on 10/21/18 (from the past 24 hour(s))  POCT urinalysis dipstick     Status: Abnormal   Collection Time: 10/21/18  2:17 PM  Result Value Ref Range   Color, UA yellow yellow   Clarity, UA clear clear   Glucose, UA negative negative mg/dL   Bilirubin, UA negative negative   Ketones, POC UA negative negative mg/dL   Spec Grav, UA  1.020 1.010 - 1.025   Blood, UA negative negative   pH, UA 5.0 5.0 - 8.0   Protein Ur, POC trace (A) negative mg/dL   Urobilinogen, UA 0.2 0.2 or 1.0 E.U./dL   Nitrite, UA Negative Negative   Leukocytes, UA Negative Negative  POCT glycosylated hemoglobin (Hb A1C)     Status: Abnormal   Collection Time: 10/21/18  2:33 PM  Result Value Ref Range   Hemoglobin A1C 7.1 (A) 4.0 - 5.6 %   HbA1c POC (<> result, manual entry)     HbA1c, POC (prediabetic range)     HbA1c, POC (controlled diabetic range)     BP Readings from Last 3 Encounters:  10/21/18 (!) 168/104  10/14/17 123/80  10/04/17 132/82    Assessment and Plan :  1. Type 2 diabetes mellitus without complication, without long-term current use of insulin (HCC) A1C 7.1.  Strongly encourage patient to take metformin twice daily as he would likely be at [place] if he took this twice daily as opposed to once daily. Continue checking sugars and working on healthy lifestyle changes. Labs pending. Referral to ophthalmology placed. Declines flu vaccine.  - POCT urinalysis dipstick - POCT glycosylated hemoglobin (Hb A1C) - CBC with Differential/Platelet - CMP14+EGFR - Lipid panel - TSH - Microalbumin/Creatinine Ratio, Urine - HM Diabetes Foot Exam - metFORMIN (GLUCOPHAGE) 1000 MG tablet; TAKE 1 TABLET BY MOUTH TWICE A DAY WITH A MEAL  Dispense: 180 tablet; Refill: 1  2. Hyperlipidemia, unspecified hyperlipidemia type - atorvastatin (LIPITOR) 40 MG tablet; Take 1 tablet (40 mg total) by mouth daily.  Dispense: 90 tablet; Refill: 1  3. Essential hypertension Uncontrolled. Could be due to shoulder pain. Will increase HCTZ aspect of micardis, continue amlodipine. He is asymptomatic. Instructed to check bp outside of office over the next couple of weeks and document these values. Goal is <140/90 and >100/60. Return in 2 weeks for bp f/u. Will repeat UA dip at f/u due to trace proteinuria.  - telmisartan-hydrochlorothiazide (MICARDIS HCT) 80-25 MG  tablet; Take 1 tablet by mouth daily.  Dispense: 90 tablet; Refill: 1 - amLODipine (NORVASC) 5 MG tablet; Take 1 tablet (5 mg total) by mouth daily. Has not been in or made appointment, may deny next request.  Dispense: 90 tablet; Refill: 1  4. Shoulder arthritis Plain film from 08/2017 showed advanced glenohumeral OA.  Patient with limited range of motion.  This is affecting his ability to do his job.  Stop BC powder.  Start naproxen twice daily, over-the-counter extra strength Tylenol as prescribed, and tramadol for breakthrough pain.  Follow-up with orthopedics for further evaluation and management. - Ambulatory referral to Orthopedic Surgery - naproxen (NAPROSYN) 500 MG tablet; Take 1 tablet (500 mg total) by mouth 2 (two) times daily with a meal.  Dispense: 30 tablet; Refill: 0 - traMADol (ULTRAM) 50 MG tablet; Take 1 tablet (50 mg total) by mouth every 8 (eight) hours as needed.  Dispense: 30 tablet; Refill: 0  A total of 40 was spent in the room with the patient, greater than 50% of which was in counseling/coordination of care regarding above.  Tenna Delaine, [name]  [facility] 10/21/2018 2:53 PM

## 2018-10-22 LAB — CMP14+EGFR
ALK PHOS: 65 IU/L (ref 39–117)
ALT: 27 IU/L (ref 0–44)
AST: 26 IU/L (ref 0–40)
Albumin/Globulin Ratio: 1.7 (ref 1.2–2.2)
Albumin: 5 g/dL (ref 3.5–5.5)
BUN/Creatinine Ratio: 13 (ref 9–20)
BUN: 15 mg/dL (ref 6–24)
Bilirubin Total: 0.7 mg/dL (ref 0.0–1.2)
CALCIUM: 10.1 mg/dL (ref 8.7–10.2)
CO2: 18 mmol/L — AB (ref 20–29)
CREATININE: 1.16 mg/dL (ref 0.76–1.27)
Chloride: 103 mmol/L (ref 96–106)
GFR calc Af Amer: 81 mL/min/{1.73_m2} (ref >59)
GFR, EST NON AFRICAN AMERICAN: 70 mL/min/{1.73_m2} (ref >59)
GLOBULIN, TOTAL: 2.9 g/dL (ref 1.5–4.5)
GLUCOSE: 118 mg/dL — AB (ref 65–99)
Potassium: 4.6 mmol/L (ref 3.5–5.2)
SODIUM: 142 mmol/L (ref 134–144)
Total Protein: 7.9 g/dL (ref 6.0–8.5)

## 2018-10-22 LAB — CBC WITH DIFFERENTIAL/PLATELET
BASOS ABS: 0.1 10*3/uL (ref 0.0–0.2)
Basos: 1 %
EOS (ABSOLUTE): 0.1 10*3/uL (ref 0.0–0.4)
EOS: 1 %
Hematocrit: 44 % (ref 37.5–51.0)
Hemoglobin: 15.2 g/dL (ref 13.0–17.7)
IMMATURE GRANULOCYTES: 1 %
Immature Grans (Abs): 0.1 10*3/uL (ref 0.0–0.1)
LYMPHS ABS: 2.7 10*3/uL (ref 0.7–3.1)
Lymphs: 35 %
MCH: 31.4 pg (ref 26.6–33.0)
MCHC: 34.5 g/dL (ref 31.5–35.7)
MCV: 91 fL (ref 79–97)
MONOS ABS: 0.6 10*3/uL (ref 0.1–0.9)
Monocytes: 8 %
Neutrophils Absolute: 4.2 10*3/uL (ref 1.4–7.0)
Neutrophils: 54 %
PLATELETS: 318 10*3/uL (ref 150–450)
RBC: 4.84 x10E6/uL (ref 4.14–5.80)
RDW: 12.7 % (ref 12.3–15.4)
WBC: 7.7 10*3/uL (ref 3.4–10.8)

## 2018-10-22 LAB — LIPID PANEL
CHOLESTEROL TOTAL: 237 mg/dL — AB (ref 100–199)
Chol/HDL Ratio: 5.9 ratio — ABNORMAL HIGH (ref 0.0–5.0)
HDL: 40 mg/dL (ref >39)
LDL Calculated: 161 mg/dL — ABNORMAL HIGH (ref 0–99)
Triglycerides: 179 mg/dL — ABNORMAL HIGH (ref 0–149)
VLDL CHOLESTEROL CAL: 36 mg/dL (ref 5–40)

## 2018-10-22 LAB — MICROALBUMIN / CREATININE URINE RATIO
CREATININE, UR: 115.6 mg/dL
MICROALB/CREAT RATIO: 63.9 mg/g{creat} — AB (ref 0.0–30.0)
MICROALBUM., U, RANDOM: 73.9 ug/mL

## 2018-10-22 LAB — TSH: TSH: 1.68 u[IU]/mL (ref 0.450–4.500)

## 2018-10-28 ENCOUNTER — Ambulatory Visit (INDEPENDENT_AMBULATORY_CARE_PROVIDER_SITE_OTHER): Payer: Self-pay | Admitting: Orthopaedic Surgery

## 2018-10-29 ENCOUNTER — Ambulatory Visit (INDEPENDENT_AMBULATORY_CARE_PROVIDER_SITE_OTHER): Payer: Self-pay

## 2018-10-29 ENCOUNTER — Ambulatory Visit (INDEPENDENT_AMBULATORY_CARE_PROVIDER_SITE_OTHER): Payer: BLUE CROSS/BLUE SHIELD | Admitting: Orthopedic Surgery

## 2018-10-29 ENCOUNTER — Encounter (INDEPENDENT_AMBULATORY_CARE_PROVIDER_SITE_OTHER): Payer: Self-pay | Admitting: Orthopedic Surgery

## 2018-10-29 ENCOUNTER — Ambulatory Visit: Payer: Self-pay | Admitting: Physician Assistant

## 2018-10-29 DIAGNOSIS — M25511 Pain in right shoulder: Secondary | ICD-10-CM | POA: Diagnosis not present

## 2018-10-29 DIAGNOSIS — G8929 Other chronic pain: Secondary | ICD-10-CM

## 2018-10-29 MED ORDER — HYDROCODONE-ACETAMINOPHEN 5-325 MG PO TABS: ORAL_TABLET | ORAL | 0 refills | Status: DC

## 2018-10-29 NOTE — Progress Notes (Signed)
Office Visit Note   Patient: Guy Horton           Date of Birth: [birth year]           MRN: [id number] Visit Date: 10/29/2018 Requested by: Leonie Douglas, [name] [place], [place] [address] PCP: [name], MD  Subjective: Chief Complaint  Patient presents with  . Right Shoulder - Pain    HPI: Guy Horton is a patient with right shoulder pain.  Has known history of right shoulder arthritis.  Pain is been going on for 6 to 7 months.  Does not wake from pain.  Did have a Workmen's Comp. injury remotely but this is not really related to that injury.  He drives a Designer, fashion/clothing.  He has to lift up to 90 pounds at times.  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  His primary goal at this time is for pain medicine to help with his arthritis.  Naprosyn made his neck stiff.  Ultram is not very helpful.  He is 57 years old.  Prior injection in October lasted 2 weeks              ROS: All systems reviewed are negative as they relate to the chief complaint within the history of present illness.  Patient denies  fevers or chills.   Assessment & Plan: Visit Diagnoses:  1. Chronic right shoulder pain     Plan: Impression is right shoulder arthritis in a patient who does very physical work.  Does have some narrowing of the acromiohumeral distance.  Shoulder replacement is in his future but he is on the young side and also does very heavy lifting.  His primary goal today is pain management.  One-time prescription for Norco written and we will refer to pain management.  Follow-up as needed.  He is not really interested in surgical treatment at this time nor is he interested in injection treatment.  Therapy I do not think would be particularly helpful in this case.  Follow-Up Instructions: Return if symptoms worsen or fail to improve.   Orders:  Orders Placed This Encounter  Procedures  . XR Shoulder Right  . Ambulatory referral to Pain Clinic    Meds ordered this encounter  Medications  . HYDROcodone-acetaminophen (NORCO/VICODIN) 5-325 MG tablet    Sig: 1 po q d prn pain    Dispense:  30 tablet    Refill:  0      Procedures: No procedures performed   Clinical Data: No additional findings.  Objective: Vital Signs: There were no vitals taken for this visit.  Physical Exam:   Constitutional: Patient appears well-developed HEENT:  Head: Normocephalic Eyes:EOM are normal Neck: Normal range of motion Cardiovascular: Normal rate Pulmonary/chest: Effort normal Neurologic: Patient is alert Skin: Skin is warm Psychiatric: Patient has normal mood and affect    Ortho Exam: Ortho exam demonstrates forward flexion abduction right around 90 degrees.  External rotation of 15 degrees of abduction on the right is about 20 degrees.  He does have good subscap and infraspinatus strength supraspinatus strength is slightly weak.  He does have some coarseness with passive range of motion of the shoulder.  Specialty Comments:  No specialty comments available.  Imaging: Xr Shoulder Right  Result Date: 10/29/2018 AP lateral outlet right shoulder reviewed.  Severe glenohumeral arthritis is present with some glenoid erosion.  Inferior humeral head osteophyte is present.  Shoulder is reduced.  Acromiohumeral distance is narrowed.    PMFS History: Patient Active Problem List   Diagnosis Date Noted  . New onset type 2 diabetes mellitus ([place]) 07/16/2017  . Hematoma of right thigh 09/13/2015  . Laceration of lower leg with complication 50/27/7412  . Non compliance w medication regimen 11/20/2014  . Gout 08/13/2012  . HTN (hypertension) 08/13/2012  . Erectile dysfunction 08/13/2012  . Hypercholesteremia 08/13/2012   Past Medical History:  Diagnosis Date  . Arthritis   . Diabetes ([place])   . Gout   . Hyperlipemia   . Hypertension     Family History  Problem Relation Age of Onset  . Diabetes Mother   . Other Father 36        Work accident  . Prostate cancer Father   . Colon cancer Neg Hx   . Colon polyps Neg Hx   . Esophageal cancer Neg Hx   . Rectal cancer Neg Hx   . Stomach cancer Neg Hx     Past Surgical History:  Procedure Laterality Date  . I&D EXTREMITY Left 11/14/2016   Procedure: IRRIGATION AND DEBRIDEMENT LEFT INDEX FINGER;  Surgeon: [name], MD;  Location: [facility];  Service: Orthopedics;  Laterality: Left;   Social History   Occupational History  . Occupation: Games developer  Tobacco Use  . Smoking status: Never Smoker  . Smokeless tobacco: Never Used  Substance and Sexual Activity  . Alcohol use: Yes    Comment: 3-4 beers daily  . Drug use: No  . Sexual activity: Yes

## 2018-10-31 ENCOUNTER — Ambulatory Visit (INDEPENDENT_AMBULATORY_CARE_PROVIDER_SITE_OTHER): Payer: BLUE CROSS/BLUE SHIELD

## 2018-10-31 ENCOUNTER — Other Ambulatory Visit: Payer: Self-pay

## 2018-10-31 ENCOUNTER — Encounter: Payer: Self-pay | Admitting: Family Medicine

## 2018-10-31 ENCOUNTER — Ambulatory Visit: Payer: BLUE CROSS/BLUE SHIELD | Admitting: Family Medicine

## 2018-10-31 VITALS — BP 129/84 | HR 91 | Temp 98.3°F | Ht 72.0 in | Wt 229.4 lb

## 2018-10-31 DIAGNOSIS — L03032 Cellulitis of left toe: Secondary | ICD-10-CM

## 2018-10-31 DIAGNOSIS — S9032XA Contusion of left foot, initial encounter: Secondary | ICD-10-CM | POA: Diagnosis not present

## 2018-10-31 MED ORDER — DOXYCYCLINE HYCLATE 100 MG PO TABS: 100.0000 mg | ORAL_TABLET | Freq: Two times a day (BID) | ORAL | 0 refills | Status: DC

## 2018-10-31 NOTE — Progress Notes (Signed)
Subjective:  By signing my name below, I, Guy Horton, attest that this documentation has been prepared under the direction and in the presence of Guy Ray, MD. Electronically Signed: Moises Horton, [place]. 10/31/2018 , 3:09 PM .  Patient was seen in Room 8 .   Patient ID: Guy Horton, male    DOB: 1961-05-12, 57 y.o.   MRN: [id number] Chief Complaint  Patient presents with  . Gout    3 days ago flair up   . Joint Swelling    left big toe infection    HPI ANTERO DEROSIA is a 57 y.o. male Here for gout flare. He has a history of gout.   Lab Results  Component Value Date   LABURIC 6.4 07/09/2017   Patient reports having left great toe pain that started 3 days ago. He states he had a laundry cart rolled over his left foot about a week ago at work; didn't report to his employer. He denies any bleeding at that time. He states this pain had improved. He noticed redness and swelling in his left great toe starting about 3-4 days ago. He denies any fevers or feeling bad. He denies any drainage or pus.   He has a history of diabetes, checked 10 days ago with A1C of 7.1.   Patient Active Problem List   Diagnosis Date Noted  . New onset type 2 diabetes mellitus ([place]) 07/16/2017  . Hematoma of right thigh 09/13/2015  . Laceration of lower leg with complication 00/93/8182  . Non compliance w medication regimen 11/20/2014  . Gout 08/13/2012  . HTN (hypertension) 08/13/2012  . Erectile dysfunction 08/13/2012  . Hypercholesteremia 08/13/2012   Past Medical History:  Diagnosis Date  . Arthritis   . Diabetes ([place])   . Gout   . Hyperlipemia   . Hypertension    Past Surgical History:  Procedure Laterality Date  . I&D EXTREMITY Left 11/14/2016   Procedure: IRRIGATION AND DEBRIDEMENT LEFT INDEX FINGER;  Surgeon: [name], MD;  Location: [facility];  Service: Orthopedics;  Laterality: Left;   No Known Allergies Prior to Admission medications   Medication Sig Start Date End  Date Taking? Authorizing Provider  allopurinol (ZYLOPRIM) 100 MG tablet TAKE 2 TABLETS BY MOUTH TWICE A DAY Patient not taking: Reported on 10/21/2018 10/08/18   [name], MD  amLODipine (NORVASC) 5 MG tablet Take 1 tablet (5 mg total) by mouth daily. Has not been in or made appointment, may deny next request. 10/21/18   Leonie Douglas, [name]  atorvastatin (LIPITOR) 40 MG tablet Take 1 tablet (40 mg total) by mouth daily. 10/21/18   Tenna Delaine D, [name]  Horton Glucose Monitoring Suppl (ONETOUCH VERIO) w/Device KIT  07/18/17   [provider]  Horton Glucose Monitoring Suppl (TRUE METRIX METER) DEVI 1 Device by Does not apply route 4 (four) times daily. 07/17/17   [name], MD  glucose Horton (TRUE METRIX Horton GLUCOSE TEST) test strip Use as instructed 12/05/17   [name], MD  HYDROcodone-acetaminophen (NORCO/VICODIN) 5-325 MG tablet 1 po q d prn pain 10/29/18   Meredith Pel, MD  ibuprofen (ADVIL,MOTRIN) 600 MG tablet Take 1 tablet (600 mg total) by mouth every 6 (six) hours as needed. 08/28/17   Joy, Shawn C, [name]  metFORMIN (GLUCOPHAGE) 1000 MG tablet TAKE 1 TABLET BY MOUTH TWICE A DAY WITH A MEAL 10/21/18   Timmothy Euler, [place] D, [name]  naproxen (NAPROSYN) 500 MG tablet Take 1 tablet (  500 mg total) by mouth 2 (two) times daily with a meal. 10/21/18   Timmothy Euler, [place] D, [name]  telmisartan-hydrochlorothiazide (MICARDIS HCT) 80-25 MG tablet Take 1 tablet by mouth daily. 10/21/18   Tenna Delaine D, [name]  traMADol (ULTRAM) 50 MG tablet Take 1 tablet (50 mg total) by mouth every 8 (eight) hours as needed. 10/21/18   Tenna Delaine D, [name]  TRUEPLUS LANCETS 28G MISC 1 each by Does not apply route 4 (four) times daily. 07/17/17   [name], MD   Social History   Socioeconomic History  . Marital status: Married    Spouse name: Not on file  . Number of children: 2  . Years of education: Not on file  . Highest education level: Not on file    Occupational History  . Occupation: Games developer  Social Needs  . Financial resource strain: Not on file  . Food insecurity:    Worry: Not on file    Inability: Not on file  . Transportation needs:    Medical: Not on file    Non-medical: Not on file  Tobacco Use  . Smoking status: Never Smoker  . Smokeless tobacco: Never Used  Substance and Sexual Activity  . Alcohol use: Yes    Comment: 3-4 beers daily  . Drug use: No  . Sexual activity: Yes  Lifestyle  . Physical activity:    Days per week: Not on file    Minutes per session: Not on file  . Stress: Not on file  Relationships  . Social connections:    Talks on phone: Not on file    Gets together: Not on file    Attends religious service: Not on file    Active member of club or organization: Not on file    Attends meetings of clubs or organizations: Not on file    Relationship status: Not on file  . Intimate partner violence:    Fear of current or ex partner: Not on file    Emotionally abused: Not on file    Physically abused: Not on file    Forced sexual activity: Not on file  Other Topics Concern  . Not on file  Social History Narrative  . Not on file   Review of Systems  Constitutional: Negative for fatigue and unexpected weight change.  Eyes: Negative for visual disturbance.  Respiratory: Negative for cough, chest tightness and shortness of breath.   Cardiovascular: Negative for chest pain, palpitations and leg swelling.  Gastrointestinal: Negative for abdominal pain and Horton in stool.  Musculoskeletal: Positive for arthralgias (left great toe) and joint swelling (left great toe).  Neurological: Negative for dizziness, light-headedness and headaches.       Objective:   Physical Exam  Constitutional: He is oriented to person, place, and time. He appears well-developed and well-nourished. No distress.  HENT:  Head: Normocephalic and atraumatic.  Eyes: Pupils are equal, round, and reactive to light. EOM  are normal.  Neck: Neck supple.  Cardiovascular: Normal rate.  Pulmonary/Chest: Effort normal. No respiratory distress.  Musculoskeletal: Normal range of motion.  Left great toe: erythema with swelling of proximal nail fold with whitish yellow appearance across the entire base of the nail fold below the nail; erythema that spreads proximally and laterally, nail appears uninvolved; whitish yellow area is 15 mm across by 5 mm deep, erythema is 3.5 cm across, 1.5 cm deep, lateral nail folds uninvolved; minimal tenderness dorsal distal 3rd metacarpal  Neurological: He is alert and oriented  to person, place, and time.  Skin: Skin is warm and dry.  Psychiatric: He has a normal mood and affect. His behavior is normal.  Nursing note and vitals reviewed.   Vitals:   10/31/18 1452  BP: 129/84  Pulse: 91  Temp: 98.3 F (36.8 C)  TempSrc: Oral  SpO2: 95%  Weight: 229 lb 6.4 oz (104.1 kg)  Height: 6' (1.829 m)   Dg Foot Complete Left  Result Date: 10/31/2018 CLINICAL DATA:  Pain in the region of the distal 1st through 3rd metatarsals after a cart rolled across his foot 1 week ago. EXAM: LEFT FOOT - COMPLETE 3+ VIEW COMPARISON:  02/17/2015 and 10/16/2006. FINDINGS: Stable degenerative and probable old posttraumatic changes involving the 3rd MTP joint. Minimal 1st MTP joint degenerative spur formation. Talotibial degenerative changes with moderate anterior spur formation and cystic change in the distal tibia. No acute fracture or dislocation seen. IMPRESSION: 1. No acute fracture or dislocation. 2. Degenerative and posttraumatic changes, as described above. Electronically Signed   By: Claudie Revering M.D.   On: 10/31/2018 16:13       Assessment & Plan:   DARWYN PONZO is a 57 y.o. male Paronychia of great toe of left foot - Plan: DG Foot Complete Left, doxycycline (VIBRA-TABS) 100 MG tablet  Contusion of left foot, initial encounter - Plan: DG Foot Complete Left Prior contusion with paronychia  noted at the great toes left foot.  Some surrounding erythema abscess at distal aspect of toe/nail fold.   - Incision and drainage was performed after risks,  Benefits,  alternatives and consent obtained.  1% lidocaine used for digital block without complication, #70 blade to elevate cuticle, incision to drain pus and packing placed. No complications.   - doxycycline '100mg'$  BID.  - recheck 3 days.   - has tramadol or Tylenol #3 at home if needed.   Meds ordered this encounter  Medications  . doxycycline (VIBRA-TABS) 100 MG tablet    Sig: Take 1 tablet (100 mg total) by mouth 2 (two) times daily.    Dispense:  20 tablet    Refill:  0   Patient Instructions   Start antibiotic twice per day.  Keep wound clean and covered.  Tramadol or Tylenol 3 you have at home as needed for pain tonight or tomorrow.  Otherwise Tylenol over-the-counter may be sufficient.  Recheck in 3-5 days.   Return to the clinic or go to the nearest emergency room if any of your symptoms worsen or new symptoms occur.  Paronychia Paronychia is an infection of the skin that surrounds a nail. It usually affects the skin around a fingernail, but it may also occur near a toenail. It often causes pain and swelling around the nail. This condition may come on suddenly or develop over a longer period. In some cases, a collection of pus (abscess) can form near or under the nail. Usually, paronychia is not serious and it clears up with treatment. What are the causes? This condition may be caused by bacteria or fungi. It is commonly caused by either Streptococcus or Staphylococcus bacteria. The bacteria or fungi often cause the infection by getting into the affected area through an opening in the skin, such as a cut or a hangnail. What increases the risk? This condition is more likely to develop in:  People who get their hands wet often, such as those who work as Designer, industrial/product, bartenders, or nurses.  People who bite their fingernails  or suck their thumbs.  People who trim their nails too short.  People who have hangnails or injured fingertips.  People who get manicures.  People who have diabetes.  What are the signs or symptoms? Symptoms of this condition include:  Redness and swelling of the skin near the nail.  Tenderness around the nail when you touch the area.  Pus-filled bumps under the cuticle. The cuticle is the skin at the base or sides of the nail.  Fluid or pus under the nail.  Throbbing pain in the area.  How is this diagnosed? This condition is usually diagnosed with a physical exam. In some cases, a sample of pus may be taken from an abscess to be tested in a lab. This can help to determine what type of bacteria or fungi is causing the condition. How is this treated? Treatment for this condition depends on the cause and severity of the condition. If the condition is mild, it may clear up on its own in a few days. Your health care provider may recommend soaking the affected area in warm water a few times a day. When treatment is needed, the options may include:  Antibiotic medicine, if the condition is caused by a bacterial infection.  Antifungal medicine, if the condition is caused by a fungal infection.  Incision and drainage, if an abscess is present. In this procedure, the health care provider will cut open the abscess so the pus can drain out.  Follow these instructions at home:  Soak the affected area in warm water if directed to do so by your health care provider. You may be told to do this for 20 minutes, 2-3 times a day. Keep the area dry in between soakings.  Take medicines only as directed by your health care provider.  If you were prescribed an antibiotic medicine, finish all of it even if you start to feel better.  Keep the affected area clean.  Do not try to drain a fluid-filled bump yourself.  If you will be washing dishes or performing other tasks that require your hands  to get wet, wear rubber gloves. You should also wear gloves if your hands might come in contact with irritating substances, such as cleaners or chemicals.  Follow your health care provider's instructions about: ? Wound care. ? Bandage (dressing) changes and removal. Contact a health care provider if:  Your symptoms get worse or do not improve with treatment.  You have a fever or chills.  You have redness spreading from the affected area.  You have continued or increased fluid, Horton, or pus coming from the affected area.  Your finger or knuckle becomes swollen or is difficult to move. This information is not intended to replace advice given to you by your health care provider. Make sure you discuss any questions you have with your health care provider. Document Released: 05/22/2001 Document Revised: 05/03/2016 Document Reviewed: 11/03/2014 Elsevier Interactive Patient Education  [employer].      If you have lab work done today you will be contacted with your lab results within the next 2 weeks.  If you have not heard from [place] then please contact us. The fastest way to get your results is to register for My Chart.   IF you received an x-Horton today, you will receive an invoice from [facility]. Please contact [facility] at [phone] with questions or concerns regarding your invoice.   IF you received labwork today, you will receive an invoice from [facility]. Please contact [facility] at  [phone] with questions or concerns regarding your invoice.   Our billing staff will not be able to assist you with questions regarding bills from these companies.  You will be contacted with the lab results as soon as they are available. The fastest way to get your results is to activate your My Chart account. Instructions are located on the last page of this paperwork. If you have not heard from [place] regarding the results in 2 weeks, please contact this office.       I  personally performed the services described in this documentation, which was scribed in my presence. The recorded information has been reviewed and considered for accuracy and completeness, addended by me as needed, and agree with information above.  Signed,   Guy Ray, MD [facility].  11/03/18 11:31 AM

## 2018-10-31 NOTE — Patient Instructions (Addendum)
Start antibiotic twice per day.  Keep wound clean and covered.  Tramadol or Tylenol 3 you have at home as needed for pain tonight or tomorrow.  Otherwise Tylenol over-the-counter may be sufficient.  Recheck in 3-5 days.   Return to the clinic or go to the nearest emergency room if any of your symptoms worsen or new symptoms occur.  Paronychia Paronychia is an infection of the skin that surrounds a nail. It usually affects the skin around a fingernail, but it may also occur near a toenail. It often causes pain and swelling around the nail. This condition may come on suddenly or develop over a longer period. In some cases, a collection of pus (abscess) can form near or under the nail. Usually, paronychia is not serious and it clears up with treatment. What are the causes? This condition may be caused by bacteria or fungi. It is commonly caused by either Streptococcus or Staphylococcus bacteria. The bacteria or fungi often cause the infection by getting into the affected area through an opening in the skin, such as a cut or a hangnail. What increases the risk? This condition is more likely to develop in:  People who get their hands wet often, such as those who work as Designer, industrial/product, bartenders, or nurses.  People who bite their fingernails or suck their thumbs.  People who trim their nails too short.  People who have hangnails or injured fingertips.  People who get manicures.  People who have diabetes.  What are the signs or symptoms? Symptoms of this condition include:  Redness and swelling of the skin near the nail.  Tenderness around the nail when you touch the area.  Pus-filled bumps under the cuticle. The cuticle is the skin at the base or sides of the nail.  Fluid or pus under the nail.  Throbbing pain in the area.  How is this diagnosed? This condition is usually diagnosed with a physical exam. In some cases, a sample of pus may be taken from an abscess to be tested in a lab.  This can help to determine what type of bacteria or fungi is causing the condition. How is this treated? Treatment for this condition depends on the cause and severity of the condition. If the condition is mild, it may clear up on its own in a few days. Your health care provider may recommend soaking the affected area in warm water a few times a day. When treatment is needed, the options may include:  Antibiotic medicine, if the condition is caused by a bacterial infection.  Antifungal medicine, if the condition is caused by a fungal infection.  Incision and drainage, if an abscess is present. In this procedure, the health care provider will cut open the abscess so the pus can drain out.  Follow these instructions at home:  Soak the affected area in warm water if directed to do so by your health care provider. You may be told to do this for 20 minutes, 2-3 times a day. Keep the area dry in between soakings.  Take medicines only as directed by your health care provider.  If you were prescribed an antibiotic medicine, finish all of it even if you start to feel better.  Keep the affected area clean.  Do not try to drain a fluid-filled bump yourself.  If you will be washing dishes or performing other tasks that require your hands to get wet, wear rubber gloves. You should also wear gloves if your hands might come in  contact with irritating substances, such as cleaners or chemicals.  Follow your health care provider's instructions about: ? Wound care. ? Bandage (dressing) changes and removal. Contact a health care provider if:  Your symptoms get worse or do not improve with treatment.  You have a fever or chills.  You have redness spreading from the affected area.  You have continued or increased fluid, blood, or pus coming from the affected area.  Your finger or knuckle becomes swollen or is difficult to move. This information is not intended to replace advice given to you by your  health care provider. Make sure you discuss any questions you have with your health care provider. Document Released: 05/22/2001 Document Revised: 05/03/2016 Document Reviewed: 11/03/2014 Elsevier Interactive Patient Education  [employer].      If you have lab work done today you will be contacted with your lab results within the next 2 weeks.  If you have not heard from [place] then please contact us. The fastest way to get your results is to register for My Chart.   IF you received an x-ray today, you will receive an invoice from [facility] Radiology. Please contact [facility] Radiology at [phone] with questions or concerns regarding your invoice.   IF you received labwork today, you will receive an invoice from [facility]. Please contact [facility] at [phone] with questions or concerns regarding your invoice.   Our billing staff will not be able to assist you with questions regarding bills from these companies.  You will be contacted with the lab results as soon as they are available. The fastest way to get your results is to activate your My Chart account. Instructions are located on the last page of this paperwork. If you have not heard from [place] regarding the results in 2 weeks, please contact this office.

## 2018-11-03 ENCOUNTER — Ambulatory Visit (INDEPENDENT_AMBULATORY_CARE_PROVIDER_SITE_OTHER): Payer: BLUE CROSS/BLUE SHIELD | Admitting: Family Medicine

## 2018-11-03 ENCOUNTER — Other Ambulatory Visit: Payer: Self-pay

## 2018-11-03 ENCOUNTER — Encounter: Payer: Self-pay | Admitting: Family Medicine

## 2018-11-03 VITALS — BP 128/80 | HR 88 | Temp 98.1°F | Ht 72.0 in | Wt 228.6 lb

## 2018-11-03 DIAGNOSIS — L03032 Cellulitis of left toe: Secondary | ICD-10-CM

## 2018-11-03 NOTE — Progress Notes (Signed)
Subjective:  By signing my name below, I, [name], attest that this documentation has been prepared under the direction and in the presence of [name], MD. Electronically Signed: [name], [place]. 11/03/2018 , 11:49 AM .  Patient was seen in Room 10 .   Patient ID: Guy Horton, male    DOB: Feb 01, 1961, 57 y.o.   MRN: [id number] Chief Complaint  Patient presents with  . Paronychia of great toe    left toe f/u    HPI Guy Horton is a 57 y.o. male Here for follow up of paronychia of left great toe. He was seen by me along with Dr. Mitchel Honour 3 days ago. Incision and drainage of paronychia performed. He was started on doxycycline 100 mg bid with wound packing placed with bandage. He does have a history of diabetes, but A1C was 7.1 a few weeks prior. He had pain medications at home if needed.   Lab Results  Component Value Date   HGBA1C 7.1 (A) 10/21/2018   Patient reports having some pus drainage yesterday when he changed the bandage yesterday. He's cleaned the area with saline water that was provided. He denies vomiting or nausea with taking the doxycycline. He's taken hydrocodone and tylenol #3 qd, including today. He denies any fevers. He is able to wear a soft toed boot for work, but will double check with HR to accommodate.   Patient Active Problem List   Diagnosis Date Noted  . New onset type 2 diabetes mellitus ([place]) 07/16/2017  . Hematoma of right thigh 09/13/2015  . Laceration of lower leg with complication 86/38/1771  . Non compliance w medication regimen 11/20/2014  . Gout 08/13/2012  . HTN (hypertension) 08/13/2012  . Erectile dysfunction 08/13/2012  . Hypercholesteremia 08/13/2012   Past Medical History:  Diagnosis Date  . Arthritis   . Diabetes ([place])   . Gout   . Hyperlipemia   . Hypertension    Past Surgical History:  Procedure Laterality Date  . I&D EXTREMITY Left 11/14/2016   Procedure: IRRIGATION AND DEBRIDEMENT LEFT INDEX FINGER;   Surgeon: [name], MD;  Location: [place];  Service: Orthopedics;  Laterality: Left;   No Known Allergies Prior to Admission medications   Medication Sig Start Date End Date Taking? Authorizing Provider  allopurinol (ZYLOPRIM) 100 MG tablet TAKE 2 TABLETS BY MOUTH TWICE A DAY 10/08/18   [name], MD  amLODipine (NORVASC) 5 MG tablet Take 1 tablet (5 mg total) by mouth daily. Has not been in or made appointment, may deny next request. 10/21/18   Leonie Douglas, [name]  atorvastatin (LIPITOR) 40 MG tablet Take 1 tablet (40 mg total) by mouth daily. 10/21/18   Tenna Delaine D, [name]  Blood Glucose Monitoring Suppl (ONETOUCH VERIO) w/Device KIT  07/18/17   [provider]  Blood Glucose Monitoring Suppl (TRUE METRIX METER) DEVI 1 Device by Does not apply route 4 (four) times daily. 07/17/17   [name], MD  doxycycline (VIBRA-TABS) 100 MG tablet Take 1 tablet (100 mg total) by mouth 2 (two) times daily. 10/31/18   [name], MD  glucose blood (TRUE METRIX BLOOD GLUCOSE TEST) test strip Use as instructed 12/05/17   [name], MD  ibuprofen (ADVIL,MOTRIN) 600 MG tablet Take 1 tablet (600 mg total) by mouth every 6 (six) hours as needed. 08/28/17   Joy, Shawn C, [name]  metFORMIN (GLUCOPHAGE) 1000 MG tablet TAKE 1 TABLET BY MOUTH TWICE A DAY WITH A  MEAL 10/21/18   Tenna Delaine D, [name]  naproxen (NAPROSYN) 500 MG tablet Take 1 tablet (500 mg total) by mouth 2 (two) times daily with a meal. 10/21/18   Timmothy Euler, [place] D, [name]  telmisartan-hydrochlorothiazide (MICARDIS HCT) 80-25 MG tablet Take 1 tablet by mouth daily. 10/21/18   Tenna Delaine D, [name]  traMADol (ULTRAM) 50 MG tablet Take 1 tablet (50 mg total) by mouth every 8 (eight) hours as needed. 10/21/18   Tenna Delaine D, [name]  TRUEPLUS LANCETS 28G MISC 1 each by Does not apply route 4 (four) times daily. 07/17/17   [name], MD   Social History   Socioeconomic History  . Marital  status: Married    Spouse name: Not on file  . Number of children: 2  . Years of education: Not on file  . Highest education level: Not on file  Occupational History  . Occupation: Games developer  Social Needs  . Financial resource strain: Not on file  . Food insecurity:    Worry: Not on file    Inability: Not on file  . Transportation needs:    Medical: Not on file    Non-medical: Not on file  Tobacco Use  . Smoking status: Never Smoker  . Smokeless tobacco: Never Used  Substance and Sexual Activity  . Alcohol use: Yes    Comment: 3-4 beers daily  . Drug use: No  . Sexual activity: Yes  Lifestyle  . Physical activity:    Days per week: Not on file    Minutes per session: Not on file  . Stress: Not on file  Relationships  . Social connections:    Talks on phone: Not on file    Gets together: Not on file    Attends religious service: Not on file    Active member of club or organization: Not on file    Attends meetings of clubs or organizations: Not on file    Relationship status: Not on file  . Intimate partner violence:    Fear of current or ex partner: Not on file    Emotionally abused: Not on file    Physically abused: Not on file    Forced sexual activity: Not on file  Other Topics Concern  . Not on file  Social History Narrative  . Not on file   Review of Systems  Constitutional: Negative for fatigue and unexpected weight change.  Eyes: Negative for visual disturbance.  Respiratory: Negative for cough, chest tightness and shortness of breath.   Cardiovascular: Negative for chest pain, palpitations and leg swelling.  Gastrointestinal: Negative for abdominal pain and blood in stool.  Skin: Positive for wound.  Neurological: Negative for dizziness, light-headedness and headaches.       Objective:   Physical Exam  Constitutional: He is oriented to person, place, and time. He appears well-developed and well-nourished. No distress.  HENT:  Head:  Normocephalic and atraumatic.  Eyes: Pupils are equal, round, and reactive to light. EOM are normal.  Neck: Neck supple.  Cardiovascular: Normal rate.  Pulmonary/Chest: Effort normal. No respiratory distress.  Musculoskeletal: Normal range of motion.  Neurological: He is alert and oriented to person, place, and time.  Skin: Skin is warm and dry.  Left great toe: packing is intact, minimal erythema around the incision site with proximal abscess, no exudate expressed around packing, no distal erythema or erythema pass the IP of the left great toe, sensation of great toe intact (11:49 AM) packing removed, wound approximately  8-10 mm, base of wound visualized with good granulation tissue; no exudate expressed or bleeding with packing removed  Psychiatric: He has a normal mood and affect. His behavior is normal.  Nursing note and vitals reviewed.   Vitals:   11/03/18 1111  BP: 128/80  Pulse: 88  Temp: 98.1 F (36.7 C)  TempSrc: Oral  SpO2: 95%  Weight: 228 lb 9.6 oz (103.7 kg)  Height: 6' (1.829 m)       Assessment & Plan:   Guy Horton is a 57 y.o. male Paronychia of great toe, left  -Packing removed, appears stable, tolerating antibiotic.  -Continue doxycycline 100 mg twice daily, wound care discussed with recheck in 1 week, RTC precautions if any worsening sooner.  Tylenol may be sufficient for discomfort at this point but has other medications as needed.  No orders of the defined types were placed in this encounter.  Patient Instructions   Clean area of her wound with soap and water once per day, keep it clean and covered with bandage.  Recheck in 1 week, but if any worsening redness, increased pus, worsening pain or other worsening symptoms please be seen sooner.  Continue antibiotic as prescribed previously.  Pain should be improving, so tylenol may be ok for pain control.   Return to the clinic or go to the nearest emergency room if any of your symptoms worsen or new  symptoms occur.   Paronychia Paronychia is an infection of the skin that surrounds a nail. It usually affects the skin around a fingernail, but it may also occur near a toenail. It often causes pain and swelling around the nail. This condition may come on suddenly or develop over a longer period. In some cases, a collection of pus (abscess) can form near or under the nail. Usually, paronychia is not serious and it clears up with treatment. What are the causes? This condition may be caused by bacteria or fungi. It is commonly caused by either Streptococcus or Staphylococcus bacteria. The bacteria or fungi often cause the infection by getting into the affected area through an opening in the skin, such as a cut or a hangnail. What increases the risk? This condition is more likely to develop in:  People who get their hands wet often, such as those who work as Designer, industrial/product, bartenders, or nurses.  People who bite their fingernails or suck their thumbs.  People who trim their nails too short.  People who have hangnails or injured fingertips.  People who get manicures.  People who have diabetes.  What are the signs or symptoms? Symptoms of this condition include:  Redness and swelling of the skin near the nail.  Tenderness around the nail when you touch the area.  Pus-filled bumps under the cuticle. The cuticle is the skin at the base or sides of the nail.  Fluid or pus under the nail.  Throbbing pain in the area.  How is this diagnosed? This condition is usually diagnosed with a physical exam. In some cases, a sample of pus may be taken from an abscess to be tested in a lab. This can help to determine what type of bacteria or fungi is causing the condition. How is this treated? Treatment for this condition depends on the cause and severity of the condition. If the condition is mild, it may clear up on its own in a few days. Your health care provider may recommend soaking the affected  area in warm water a few times a  day. When treatment is needed, the options may include:  Antibiotic medicine, if the condition is caused by a bacterial infection.  Antifungal medicine, if the condition is caused by a fungal infection.  Incision and drainage, if an abscess is present. In this procedure, the health care provider will cut open the abscess so the pus can drain out.  Follow these instructions at home:  Soak the affected area in warm water if directed to do so by your health care provider. You may be told to do this for 20 minutes, 2-3 times a day. Keep the area dry in between soakings.  Take medicines only as directed by your health care provider.  If you were prescribed an antibiotic medicine, finish all of it even if you start to feel better.  Keep the affected area clean.  Do not try to drain a fluid-filled bump yourself.  If you will be washing dishes or performing other tasks that require your hands to get wet, wear rubber gloves. You should also wear gloves if your hands might come in contact with irritating substances, such as cleaners or chemicals.  Follow your health care provider's instructions about: ? Wound care. ? Bandage (dressing) changes and removal. Contact a health care provider if:  Your symptoms get worse or do not improve with treatment.  You have a fever or chills.  You have redness spreading from the affected area.  You have continued or increased fluid, blood, or pus coming from the affected area.  Your finger or knuckle becomes swollen or is difficult to move. This information is not intended to replace advice given to you by your health care provider. Make sure you discuss any questions you have with your health care provider. Document Released: 05/22/2001 Document Revised: 05/03/2016 Document Reviewed: 11/03/2014 Elsevier Interactive Patient Education  [employer].   If you have lab work done today you will be contacted with your  lab results within the next 2 weeks.  If you have not heard from [place] then please contact us. The fastest way to get your results is to register for My Chart.   IF you received an x-ray today, you will receive an invoice from [facility]. Please contact [facility] at [phone] with questions or concerns regarding your invoice.   IF you received labwork today, you will receive an invoice from [place]. Please contact [facility] at [phone] with questions or concerns regarding your invoice.   Our billing staff will not be able to assist you with questions regarding bills from these companies.  You will be contacted with the lab results as soon as they are available. The fastest way to get your results is to activate your My Chart account. Instructions are located on the last page of this paperwork. If you have not heard from [place] regarding the results in 2 weeks, please contact this office.       I personally performed the services described in this documentation, which was scribed in my presence. The recorded information has been reviewed and considered for accuracy and completeness, addended by me as needed, and agree with information above.  Signed,   [name], MD [facility].  11/03/18 1:54 PM

## 2018-11-03 NOTE — Patient Instructions (Addendum)
Clean area of her wound with soap and water once per day, keep it clean and covered with bandage.  Recheck in 1 week, but if any worsening redness, increased pus, worsening pain or other worsening symptoms please be seen sooner.  Continue antibiotic as prescribed previously.  Pain should be improving, so tylenol may be ok for pain control.   Return to the clinic or go to the nearest emergency room if any of your symptoms worsen or new symptoms occur.   Paronychia Paronychia is an infection of the skin that surrounds a nail. It usually affects the skin around a fingernail, but it may also occur near a toenail. It often causes pain and swelling around the nail. This condition may come on suddenly or develop over a longer period. In some cases, a collection of pus (abscess) can form near or under the nail. Usually, paronychia is not serious and it clears up with treatment. What are the causes? This condition may be caused by bacteria or fungi. It is commonly caused by either Streptococcus or Staphylococcus bacteria. The bacteria or fungi often cause the infection by getting into the affected area through an opening in the skin, such as a cut or a hangnail. What increases the risk? This condition is more likely to develop in:  People who get their hands wet often, such as those who work as Designer, industrial/product, bartenders, or nurses.  People who bite their fingernails or suck their thumbs.  People who trim their nails too short.  People who have hangnails or injured fingertips.  People who get manicures.  People who have diabetes.  What are the signs or symptoms? Symptoms of this condition include:  Redness and swelling of the skin near the nail.  Tenderness around the nail when you touch the area.  Pus-filled bumps under the cuticle. The cuticle is the skin at the base or sides of the nail.  Fluid or pus under the nail.  Throbbing pain in the area.  How is this diagnosed? This condition is  usually diagnosed with a physical exam. In some cases, a sample of pus may be taken from an abscess to be tested in a lab. This can help to determine what type of bacteria or fungi is causing the condition. How is this treated? Treatment for this condition depends on the cause and severity of the condition. If the condition is mild, it may clear up on its own in a few days. Your health care provider may recommend soaking the affected area in warm water a few times a day. When treatment is needed, the options may include:  Antibiotic medicine, if the condition is caused by a bacterial infection.  Antifungal medicine, if the condition is caused by a fungal infection.  Incision and drainage, if an abscess is present. In this procedure, the health care provider will cut open the abscess so the pus can drain out.  Follow these instructions at home:  Soak the affected area in warm water if directed to do so by your health care provider. You may be told to do this for 20 minutes, 2-3 times a day. Keep the area dry in between soakings.  Take medicines only as directed by your health care provider.  If you were prescribed an antibiotic medicine, finish all of it even if you start to feel better.  Keep the affected area clean.  Do not try to drain a fluid-filled bump yourself.  If you will be washing dishes or performing other  tasks that require your hands to get wet, wear rubber gloves. You should also wear gloves if your hands might come in contact with irritating substances, such as cleaners or chemicals.  Follow your health care provider's instructions about: ? Wound care. ? Bandage (dressing) changes and removal. Contact a health care provider if:  Your symptoms get worse or do not improve with treatment.  You have a fever or chills.  You have redness spreading from the affected area.  You have continued or increased fluid, blood, or pus coming from the affected area.  Your finger or  knuckle becomes swollen or is difficult to move. This information is not intended to replace advice given to you by your health care provider. Make sure you discuss any questions you have with your health care provider. Document Released: 05/22/2001 Document Revised: 05/03/2016 Document Reviewed: 11/03/2014 Elsevier Interactive Patient Education  [employer].   If you have lab work done today you will be contacted with your lab results within the next 2 weeks.  If you have not heard from [place] then please contact us. The fastest way to get your results is to register for My Chart.   IF you received an x-ray today, you will receive an invoice from [facility]. Please contact [facility] at [phone] with questions or concerns regarding your invoice.   IF you received labwork today, you will receive an invoice from [place]. Please contact [facility] at [phone] with questions or concerns regarding your invoice.   Our billing staff will not be able to assist you with questions regarding bills from these companies.  You will be contacted with the lab results as soon as they are available. The fastest way to get your results is to activate your My Chart account. Instructions are located on the last page of this paperwork. If you have not heard from [place] regarding the results in 2 weeks, please contact this office.

## 2018-11-05 ENCOUNTER — Telehealth: Payer: Self-pay | Admitting: *Deleted

## 2018-11-05 NOTE — Telephone Encounter (Signed)
Patient left a message stating that someone from our office called. Presumably a referral phone call to schedule.  Asking for [place] to call back

## 2018-11-13 ENCOUNTER — Ambulatory Visit: Payer: Self-pay | Admitting: Family Medicine

## 2018-11-18 ENCOUNTER — Other Ambulatory Visit: Payer: Self-pay | Admitting: Family Medicine

## 2018-11-18 DIAGNOSIS — Z8739 Personal history of other diseases of the musculoskeletal system and connective tissue: Secondary | ICD-10-CM

## 2018-11-19 NOTE — Telephone Encounter (Signed)
Called patient. Pt states his last office visit was his physical. Will refer medication request back to office

## 2018-11-19 NOTE — Telephone Encounter (Signed)
Requested medication (s) are due for refill today: yes  Requested medication (s) are on the active medication list: yes  Last refill:  10/08/18    #30  Guy Horton  Future visit scheduled: no  Notes to clinic:  Contacted pt last physical 07/09/17. Pt states office visit 2 weeks ago was his physical.    Requested Prescriptions  Pending Prescriptions Disp Refills   allopurinol (ZYLOPRIM) 100 MG tablet [Pharmacy Med Name: ALLOPURINOL 100 MG TABLET] 30 tablet 0    Sig: TAKE 2 TABLETS BY MOUTH TWICE A DAY     Endocrinology:  Gout Agents Failed - 11/19/2018  3:00 PM      Failed - Uric Acid in normal range and within 360 days    Uric Acid  Date Value Ref Range Status  07/09/2017 6.4 3.7 - 8.6 mg/dL Final    Comment:               Therapeutic target for gout patients: <6.0         Passed - Cr in normal range and within 360 days    Creat  Date Value Ref Range Status  08/27/2015 1.35 (H) 0.70 - 1.33 mg/dL Final   Creatinine, Ser  Date Value Ref Range Status  10/21/2018 1.16 0.76 - 1.27 mg/dL Final         Passed - Valid encounter within last 12 months    Recent Outpatient Visits          2 weeks ago Paronychia of great toe, left   [facility] [place], Ranell Patrick, MD   2 weeks ago Paronychia of great toe of left foot   [facility], Ranell Patrick, MD   4 weeks ago Type 2 diabetes mellitus without complication, without long-term current use of insulin [facility])   [facility] [place], [place] D, [name]   1 year ago Type 2 diabetes mellitus without complication, with long-term current use of insulin [facility])   [facility] [place], Ranell Patrick, MD   1 year ago Type 2 diabetes mellitus without complication, without long-term current use of insulin [facility])   [facility] [place], Ranell Patrick, MD

## 2018-12-16 ENCOUNTER — Other Ambulatory Visit: Payer: Self-pay | Admitting: Family Medicine

## 2018-12-16 DIAGNOSIS — E119 Type 2 diabetes mellitus without complications: Secondary | ICD-10-CM

## 2018-12-16 DIAGNOSIS — Z8739 Personal history of other diseases of the musculoskeletal system and connective tissue: Secondary | ICD-10-CM

## 2018-12-16 NOTE — Telephone Encounter (Signed)
[facility] called and spoke to [facility] who says the patient has refills on Metformin and Telmisartan, but will need Allopurinol refilled.

## 2018-12-16 NOTE — Addendum Note (Signed)
Addended by: Matilde Sprang on: 12/16/2018 12:40 PM   Modules accepted: Orders

## 2018-12-16 NOTE — Telephone Encounter (Signed)
Duplicate request

## 2018-12-16 NOTE — Telephone Encounter (Signed)
Pt called in to check the status of his refill request. Pt says that he also need refill for metFORMIN (GLUCOPHAGE) 1000 MG tablet  And also telmisartan-hydrochlorothiazide (MICARDIS HCT) 80-25 MG tablet   Pt is requesting a 90 day supply on all 3 medications.    Pharmacy: [id number] [place], [place]

## 2018-12-16 NOTE — Telephone Encounter (Signed)
Requested medication (s) are due for refill today: Yes  Requested medication (s) are on the active medication list: Yes  Last refill:  09/2018  Future visit scheduled: No  Notes to clinic:  Requesting 90 day refill, unable to refill due to diagnosis code needed.     Requested Prescriptions  Pending Prescriptions Disp Refills   allopurinol (ZYLOPRIM) 100 MG tablet 30 tablet 0    Sig: Take 2 tablets (200 mg total) by mouth 2 (two) times daily.     Endocrinology:  Gout Agents Failed - 12/16/2018 12:40 PM      Failed - Uric Acid in normal range and within 360 days    Uric Acid  Date Value Ref Range Status  07/09/2017 6.4 3.7 - 8.6 mg/dL Final    Comment:               Therapeutic target for gout patients: <6.0         Passed - Cr in normal range and within 360 days    Creat  Date Value Ref Range Status  08/27/2015 1.35 (H) 0.70 - 1.33 mg/dL Final   Creatinine, Ser  Date Value Ref Range Status  10/21/2018 1.16 0.76 - 1.27 mg/dL Final         Passed - Valid encounter within last 12 months    Recent Outpatient Visits          1 month ago Paronychia of great toe, left   [facility], Ranell Patrick, MD   1 month ago Paronychia of great toe of left foot   [facility], Ranell Patrick, MD   1 month ago Type 2 diabetes mellitus without complication, without long-term current use of insulin ([place])   [facility] [place], [place] D, [name]   1 year ago Type 2 diabetes mellitus without complication, with long-term current use of insulin ([place])   [facility], Ranell Patrick, MD   1 year ago Type 2 diabetes mellitus without complication, without long-term current use of insulin ([place])   [facility], Ranell Patrick, MD           Refused Prescriptions Disp Refills   allopurinol (ZYLOPRIM) 100 MG tablet [Pharmacy Med Name: ALLOPURINOL 100 MG TABLET] 30 tablet 0    Sig: TAKE 2 TABLETS BY MOUTH TWICE A DAY     Endocrinology:   Gout Agents Failed - 12/16/2018 12:40 PM      Failed - Uric Acid in normal range and within 360 days    Uric Acid  Date Value Ref Range Status  07/09/2017 6.4 3.7 - 8.6 mg/dL Final    Comment:               Therapeutic target for gout patients: <6.0         Passed - Cr in normal range and within 360 days    Creat  Date Value Ref Range Status  08/27/2015 1.35 (H) 0.70 - 1.33 mg/dL Final   Creatinine, Ser  Date Value Ref Range Status  10/21/2018 1.16 0.76 - 1.27 mg/dL Final         Passed - Valid encounter within last 12 months    Recent Outpatient Visits          1 month ago Paronychia of great toe, left   [facility], Ranell Patrick, MD   1 month ago Paronychia of great toe of left foot  [facility] [place], MD   1 month ago Type 2 diabetes mellitus without complication, without long-term current use of insulin [facility])   [facility] [place], [place] D, [name]   1 year ago Type 2 diabetes mellitus without complication, with long-term current use of insulin [facility])   [facility], Ranell Patrick, MD   1 year ago Type 2 diabetes mellitus without complication, without long-term current use of insulin [facility])   [facility], Ranell Patrick, MD

## 2018-12-22 ENCOUNTER — Other Ambulatory Visit: Payer: Self-pay | Admitting: Family Medicine

## 2018-12-22 DIAGNOSIS — E119 Type 2 diabetes mellitus without complications: Secondary | ICD-10-CM

## 2018-12-22 NOTE — Telephone Encounter (Signed)
[facility] called and spoke to [place], [facility] about Metformin sent on 10/21/18 #180/1 refill. She says to disregard the request.

## 2019-01-02 ENCOUNTER — Other Ambulatory Visit: Payer: Self-pay | Admitting: Family Medicine

## 2019-01-02 DIAGNOSIS — Z8739 Personal history of other diseases of the musculoskeletal system and connective tissue: Secondary | ICD-10-CM

## 2019-03-04 IMAGING — CR DG SHOULDER 2+V*R*
4 series · 4 of 4 positions shown · non-contrast
Comparison: None.

CLINICAL DATA: Right shoulder pain after injury at work yesterday.
Initial encounter.

EXAM:
RIGHT SHOULDER - 2+ VIEW

[w shoulder external right (1 of 2)]
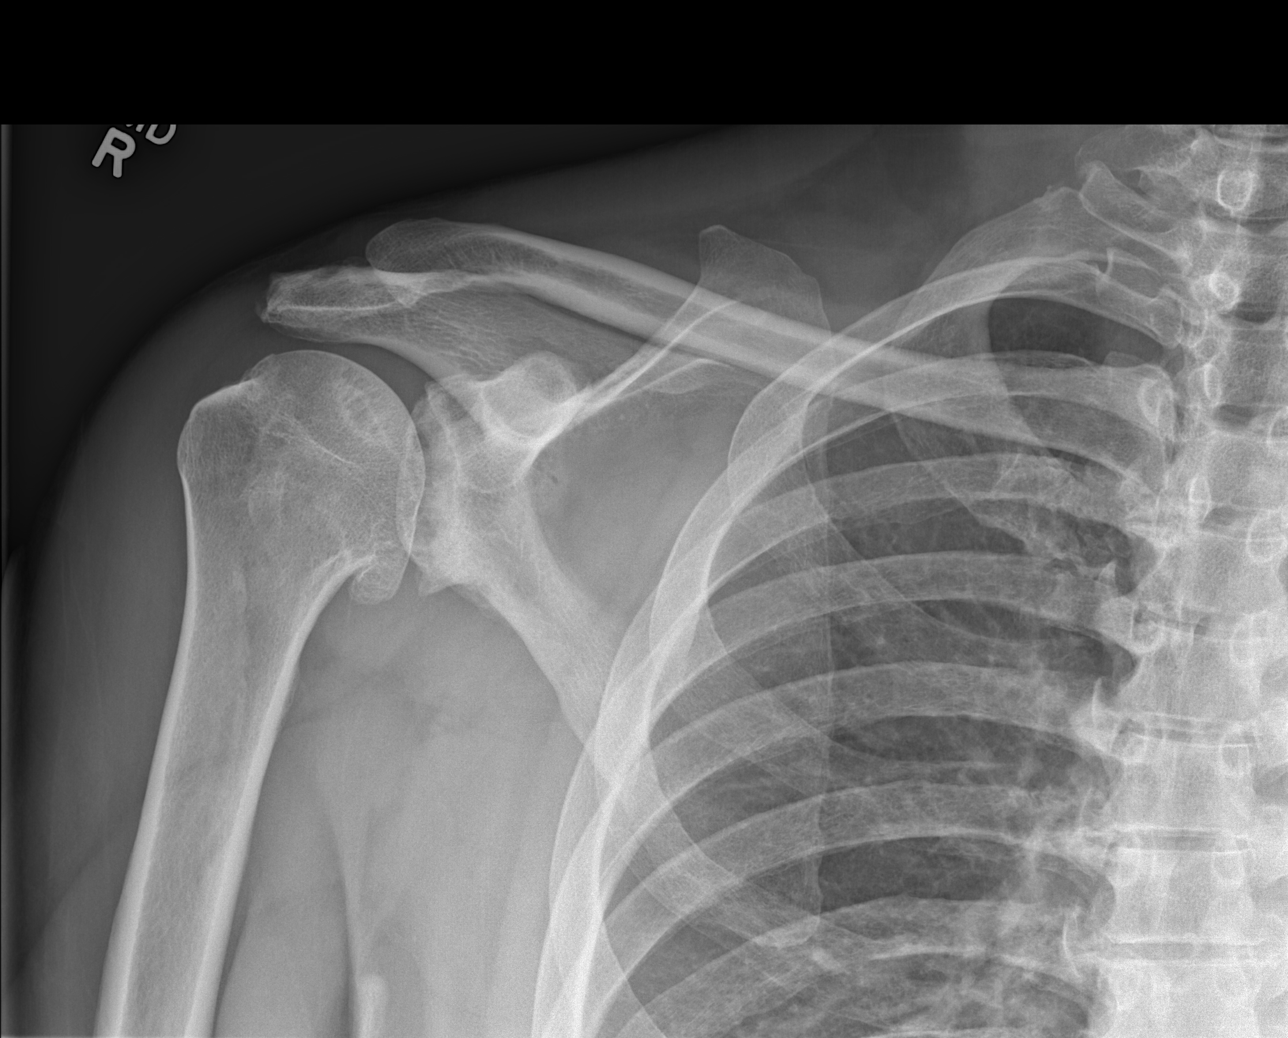

[w shoulder y-view right]
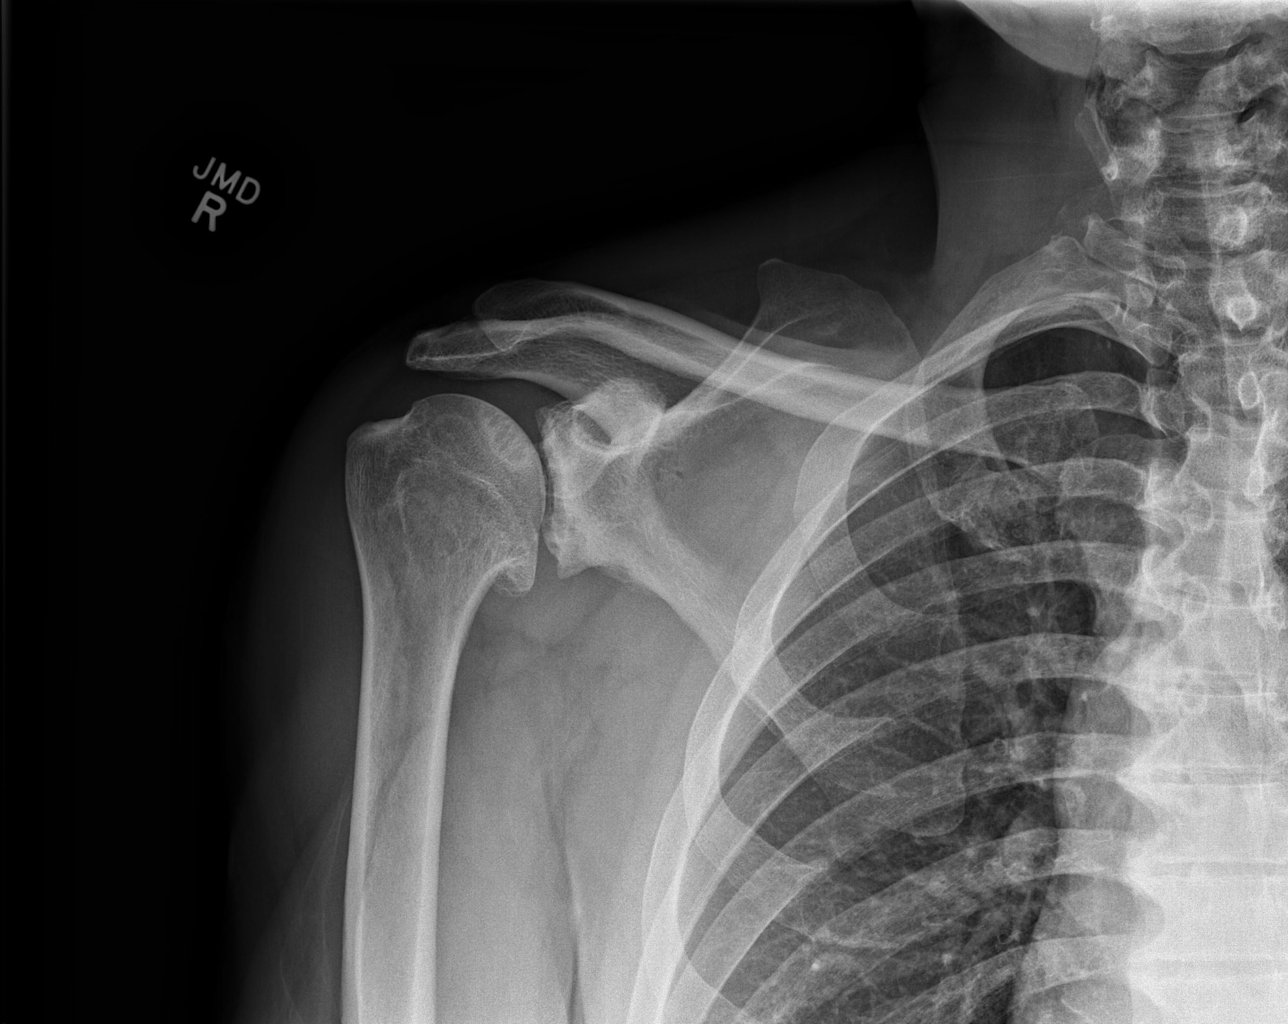

[w shoulder external right (2 of 2)]
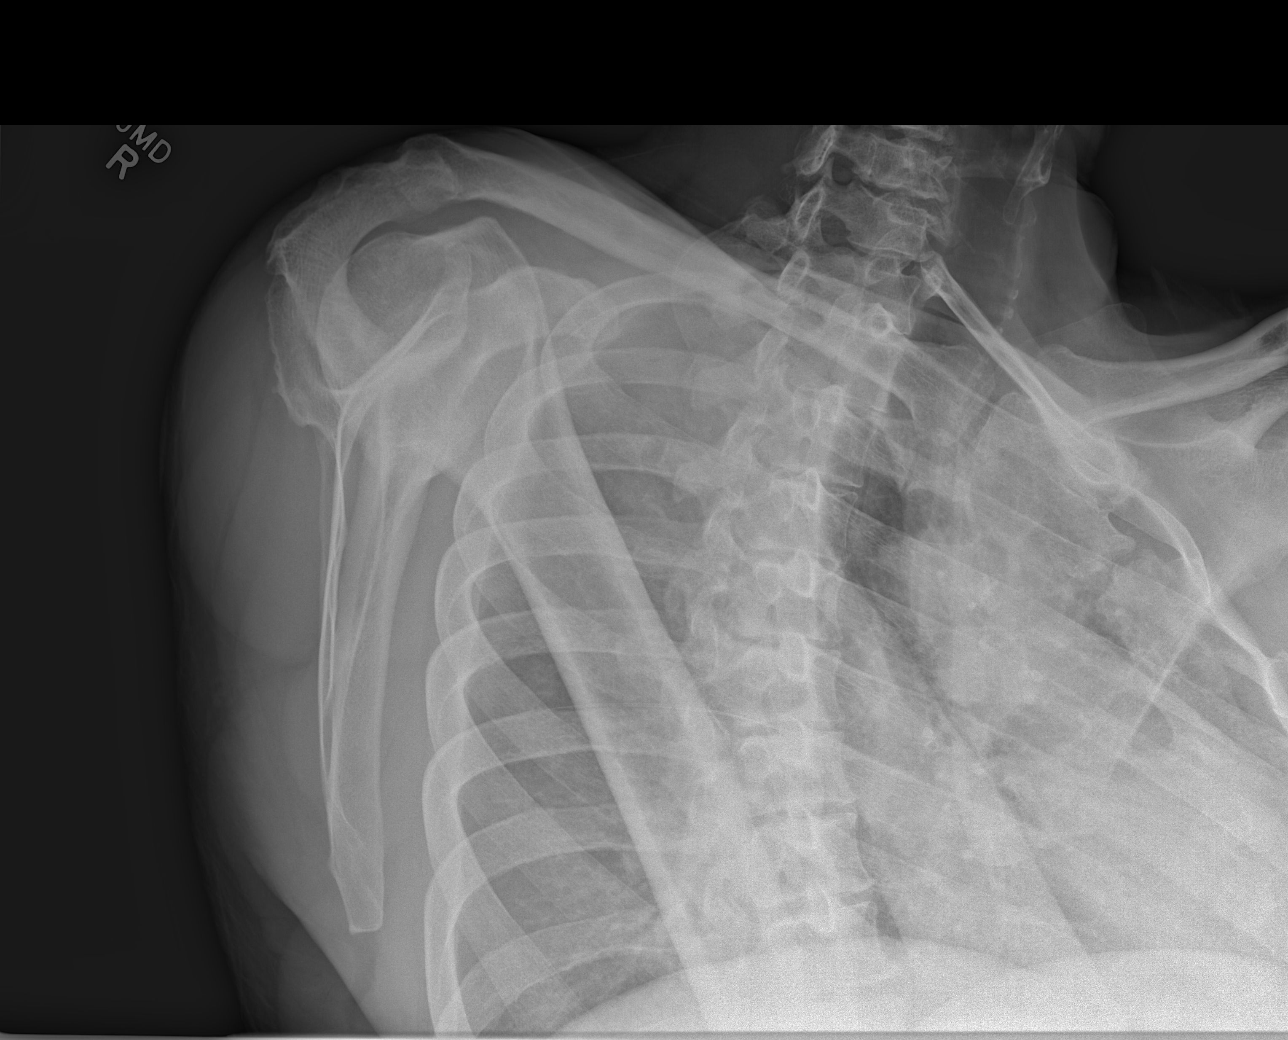

[w shoulder axillary right]
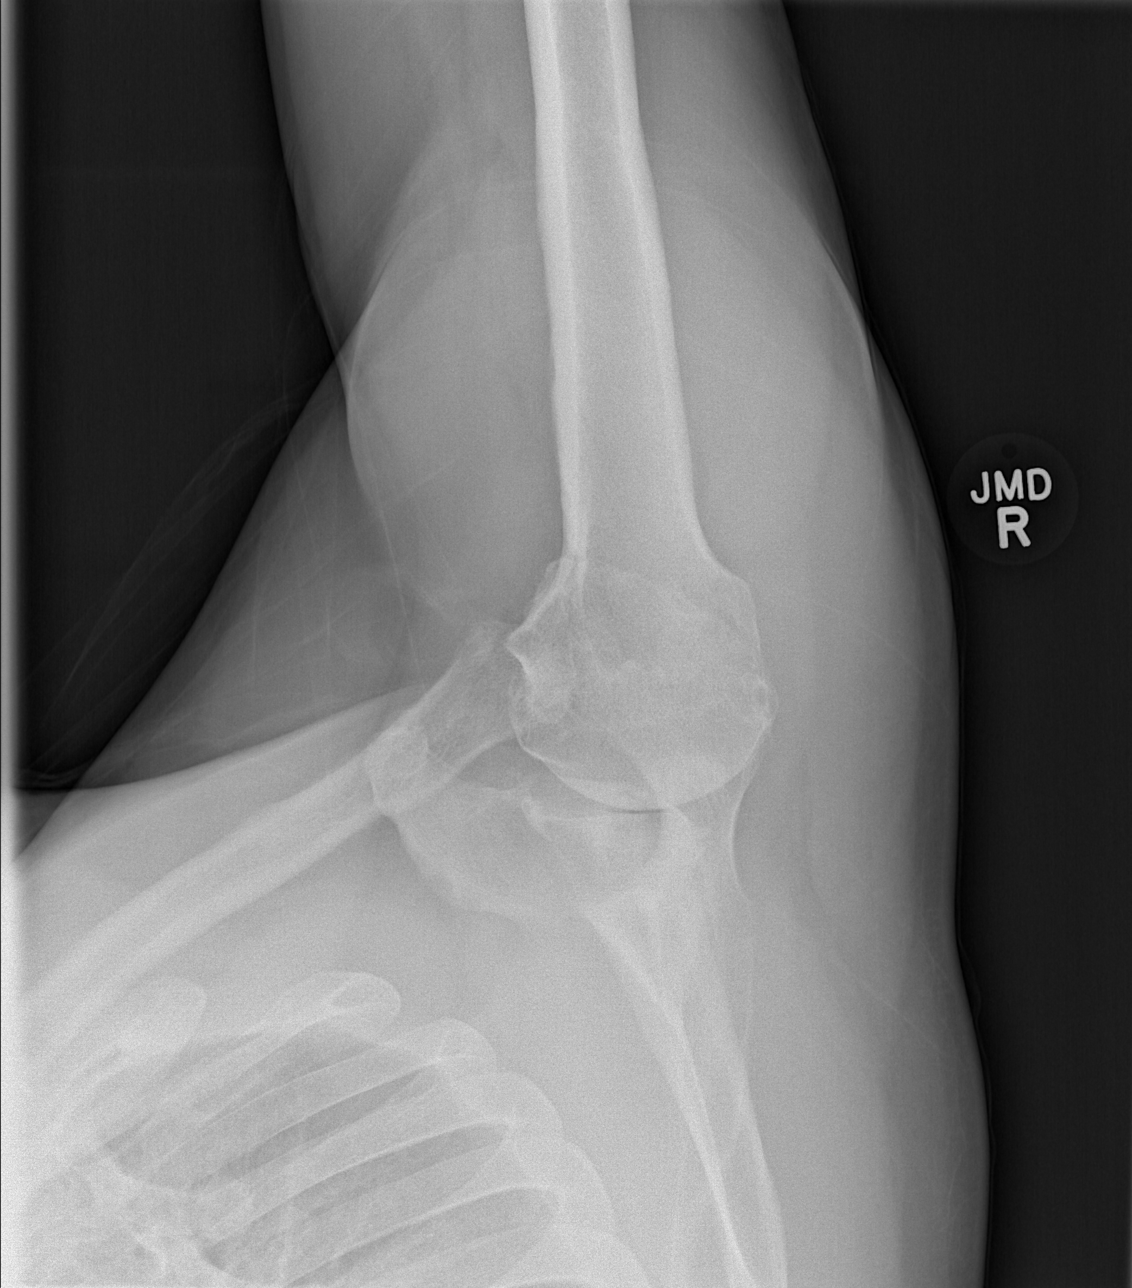

[4 of 4 positions shown; findings below may reference images not displayed]

FINDINGS: There is no evidence of fracture or dislocation.

Glenohumeral osteoarthritis with prominent inferior spurring and
subchondral cystic change. Subchondral sclerosis and cystic change
along the humeral head without collapse. The glenohumeral joint is
narrowed on axillary view, with posterior positioning of the head.
IMPRESSION: 1. No acute osseous finding.
2. Advanced glenohumeral osteoarthritis.

## 2019-03-26 ENCOUNTER — Other Ambulatory Visit: Payer: Self-pay | Admitting: Physician Assistant

## 2019-03-26 DIAGNOSIS — I10 Essential (primary) hypertension: Secondary | ICD-10-CM

## 2019-10-05 ENCOUNTER — Other Ambulatory Visit: Payer: Self-pay | Admitting: Family Medicine

## 2019-10-05 DIAGNOSIS — Z8739 Personal history of other diseases of the musculoskeletal system and connective tissue: Secondary | ICD-10-CM

## 2019-10-05 NOTE — Telephone Encounter (Signed)
Requested medication (s) are due for refill today: yes  Requested medication (s) are on the active medication list:yes  Last refill: 07/31/19  Future visit scheduled: No  Notes to clinic: Need lab uric acid last checked 2018    Requested Prescriptions  Pending Prescriptions Disp Refills   allopurinol (ZYLOPRIM) 100 MG tablet [Pharmacy Med Name: ALLOPURINOL 100 MG TABLET] 30 tablet 0    Sig: TAKE 2 TABLETS BY MOUTH TWICE A DAY     Endocrinology:  Gout Agents Failed - 10/05/2019  3:26 PM      Failed - Uric Acid in normal range and within 360 days    Uric Acid  Date Value Ref Range Status  07/09/2017 6.4 3.7 - 8.6 mg/dL Final    Comment:               Therapeutic target for gout patients: <6.0         Passed - Cr in normal range and within 360 days    Creat  Date Value Ref Range Status  08/27/2015 1.35 (H) 0.70 - 1.33 mg/dL Final   Creatinine, Ser  Date Value Ref Range Status  10/21/2018 1.16 0.76 - 1.27 mg/dL Final         Passed - Valid encounter within last 12 months    Recent Outpatient Visits          11 months ago Paronychia of great toe, left   [facility], Ranell Patrick, MD   11 months ago Paronychia of great toe of left foot   [facility], Ranell Patrick, MD   11 months ago Type 2 diabetes mellitus without complication, without long-term current use of insulin [facility])   [facility] [place], [place] D, [name]   2 years ago Type 2 diabetes mellitus without complication, with long-term current use of insulin [facility])   [facility], Ranell Patrick, MD   2 years ago Type 2 diabetes mellitus without complication, without long-term current use of insulin [facility])   [facility], Ranell Patrick, MD

## 2019-10-06 NOTE — Telephone Encounter (Signed)
No further refills without office visit. Please schedule

## 2019-10-09 ENCOUNTER — Telehealth: Payer: Self-pay | Admitting: Family Medicine

## 2019-10-09 NOTE — Telephone Encounter (Signed)
Called 2x no answer left message on voicemail

## 2019-12-21 ENCOUNTER — Other Ambulatory Visit: Payer: Self-pay

## 2019-12-21 ENCOUNTER — Other Ambulatory Visit: Payer: Self-pay | Admitting: Registered Nurse

## 2019-12-21 ENCOUNTER — Encounter: Payer: Self-pay | Admitting: Registered Nurse

## 2019-12-21 ENCOUNTER — Ambulatory Visit: Payer: BC Managed Care – PPO | Admitting: Registered Nurse

## 2019-12-21 DIAGNOSIS — I10 Essential (primary) hypertension: Secondary | ICD-10-CM

## 2019-12-21 DIAGNOSIS — E119 Type 2 diabetes mellitus without complications: Secondary | ICD-10-CM | POA: Diagnosis not present

## 2019-12-21 DIAGNOSIS — E785 Hyperlipidemia, unspecified: Secondary | ICD-10-CM

## 2019-12-21 DIAGNOSIS — Z8739 Personal history of other diseases of the musculoskeletal system and connective tissue: Secondary | ICD-10-CM

## 2019-12-21 DIAGNOSIS — M109 Gout, unspecified: Secondary | ICD-10-CM

## 2019-12-21 LAB — POCT GLYCOSYLATED HEMOGLOBIN (HGB A1C): Hemoglobin A1C: 8.2 % — AB (ref 4.0–5.6)

## 2019-12-21 MED ORDER — AMLODIPINE BESYLATE 5 MG PO TABS: 5.0000 mg | ORAL_TABLET | Freq: Every day | ORAL | 1 refills | Status: DC

## 2019-12-21 MED ORDER — METFORMIN HCL 1000 MG PO TABS: ORAL_TABLET | ORAL | 1 refills | Status: DC

## 2019-12-21 MED ORDER — ATORVASTATIN CALCIUM 40 MG PO TABS: 40.0000 mg | ORAL_TABLET | Freq: Every day | ORAL | 1 refills | Status: DC

## 2019-12-21 MED ORDER — TELMISARTAN-HCTZ 80-25 MG PO TABS: 1.0000 | ORAL_TABLET | Freq: Every day | ORAL | 1 refills | Status: DC

## 2019-12-21 MED ORDER — ALLOPURINOL 100 MG PO TABS: 100.0000 mg | ORAL_TABLET | Freq: Every day | ORAL | 3 refills | Status: DC

## 2019-12-21 NOTE — Patient Instructions (Signed)
° ° ° °  If you have lab work done today you will be contacted with your lab results within the next 2 weeks.  If you have not heard from us then please contact us. The fastest way to get your results is to register for My Chart. ° ° °IF you received an x-ray today, you will receive an invoice from [facility]. Please contact [facility] at [phone] with questions or concerns regarding your invoice.  ° °IF you received labwork today, you will receive an invoice from [facility]. Please contact [facility] at [phone] with questions or concerns regarding your invoice.  ° °Our billing staff will not be able to assist you with questions regarding bills from these companies. ° °You will be contacted with the lab results as soon as they are available. The fastest way to get your results is to activate your My Chart account. Instructions are located on the last page of this paperwork. If you have not heard from us regarding the results in 2 weeks, please contact this office. °  ° ° ° °

## 2019-12-21 NOTE — Progress Notes (Signed)
Meds and future labs ordered  Kathrin Ruddy, NP

## 2019-12-21 NOTE — Progress Notes (Signed)
Established Patient Office Visit  Subjective:  Patient ID: Guy Horton, male    DOB: 03-01-1961  Age: 59 y.o. MRN: [id number]  CC: No chief complaint on file.   HPI Guy Horton presents for medication refills  He has a number of chronic conditions:   Gout: has been taking allopurinol intermittently. Has run out. No recent flare ups.  T2DM: Has been taking metformin intermittently. Last A1c was over 1 year ago, 7.1. reports he has been taking his sugars - usually around 117-125  HTN: Has been taking amlodipine and telmisartan-HCTZ. Misses doses sometimes. Mildly elevated BP on arrival today. Denies symptoms of elevated BP including chest pain, shob, doe, headaches, visual changes, dependent edema.  Hyperlipidemia: Has run out of atorvastatin.   He is making sure he gets medication refills today as he is unsure on whether or not he will be laid off - he is concerned that he will lose his insurance soon.  We discussed that he is not fasting but we will want him to return for fasting labs this week if possible.  Past Medical History:  Diagnosis Date  . Arthritis   . Diabetes ([place])   . Gout   . Hyperlipemia   . Hypertension     Past Surgical History:  Procedure Laterality Date  . I & D EXTREMITY Left 11/14/2016   Procedure: IRRIGATION AND DEBRIDEMENT LEFT INDEX FINGER;  Surgeon: [name], MD;  Location: [facility];  Service: Orthopedics;  Laterality: Left;    Family History  Problem Relation Age of Onset  . Diabetes Mother   . Other Father 64       Work accident  . Prostate cancer Father   . Colon cancer Neg Hx   . Colon polyps Neg Hx   . Esophageal cancer Neg Hx   . Rectal cancer Neg Hx   . Stomach cancer Neg Hx     Social History   Socioeconomic History  . Marital status: Married    Spouse name: Not on file  . Number of children: 2  . Years of education: Not on file  . Highest education level: Not on file  Occupational History  . Occupation: Engineer, manufacturing  Tobacco Use  . Smoking status: Never Smoker  . Smokeless tobacco: Never Used  Substance and Sexual Activity  . Alcohol use: Yes    Comment: 3-4 beers daily  . Drug use: No  . Sexual activity: Yes  Other Topics Concern  . Not on file  Social History Narrative  . Not on file   Social Determinants of Health   Financial Resource Strain:   . Difficulty of Paying Living Expenses: Not on file  Food Insecurity:   . Worried About Charity fundraiser in the Last Year: Not on file  . Ran Out of Food in the Last Year: Not on file  Transportation Needs:   . Lack of Transportation (Medical): Not on file  . Lack of Transportation (Non-Medical): Not on file  Physical Activity:   . Days of Exercise per Week: Not on file  . Minutes of Exercise per Session: Not on file  Stress:   . Feeling of Stress : Not on file  Social Connections:   . Frequency of Communication with Friends and Family: Not on file  . Frequency of Social Gatherings with Friends and Family: Not on file  . Attends Religious Services: Not on file  . Active Member of Clubs or Organizations: Not on file  .  Attends Archivist Meetings: Not on file  . Marital Status: Not on file  Intimate Partner Violence:   . Fear of Current or Ex-Partner: Not on file  . Emotionally Abused: Not on file  . Physically Abused: Not on file  . Sexually Abused: Not on file    Outpatient Medications Prior to Visit  Medication Sig Dispense Refill  . Blood Glucose Monitoring Suppl (ONETOUCH VERIO) w/Device KIT     . Blood Glucose Monitoring Suppl (TRUE METRIX METER) DEVI 1 Device by Does not apply route 4 (four) times daily. 1 Device 0  . glucose blood (TRUE METRIX BLOOD GLUCOSE TEST) test strip Use as instructed 100 each 2  . HYDROcodone-acetaminophen (NORCO/VICODIN) 5-325 MG tablet     . TRUEPLUS LANCETS 28G MISC 1 each by Does not apply route 4 (four) times daily. 100 each 2  . allopurinol (ZYLOPRIM) 100 MG tablet TAKE 2  TABLETS BY MOUTH TWICE A DAY (Patient not taking: Reported on 12/21/2019) 30 tablet 0  . amLODipine (NORVASC) 5 MG tablet Take 1 tablet (5 mg total) by mouth daily. Has not been in or made appointment, may deny next request. 90 tablet 1  . atorvastatin (LIPITOR) 40 MG tablet Take 1 tablet (40 mg total) by mouth daily. 90 tablet 1  . metFORMIN (GLUCOPHAGE) 1000 MG tablet TAKE 1 TABLET BY MOUTH TWICE A DAY WITH A MEAL (Patient not taking: Reported on 12/21/2019) 180 tablet 1  . telmisartan-hydrochlorothiazide (MICARDIS HCT) 80-25 MG tablet TAKE 1 TABLET BY MOUTH EVERY DAY 90 tablet 0   Facility-Administered Medications Prior to Visit  Medication Dose Route Frequency Provider Last Rate Last Admin  . 0.9 %  sodium chloride infusion  500 mL Intravenous Continuous Ladene Artist, MD        No Known Allergies  ROS Review of Systems  Constitutional: Negative.   HENT: Negative.   Eyes: Negative.   Respiratory: Negative.   Cardiovascular: Negative.   Gastrointestinal: Negative.   Endocrine: Negative.   Genitourinary: Negative.   Musculoskeletal: Negative.   Skin: Negative.   Allergic/Immunologic: Negative.   Neurological: Negative.   Hematological: Negative.   Psychiatric/Behavioral: Negative.   All other systems reviewed and are negative.     Objective:    Physical Exam  Constitutional: He is oriented to person, place, and time. He appears well-developed and well-nourished. No distress.  Cardiovascular: Normal rate and regular rhythm.  Pulmonary/Chest: Effort normal. No respiratory distress.  Neurological: He is alert and oriented to person, place, and time.  Skin: Skin is warm and dry. No rash noted. He is not diaphoretic. No erythema. No pallor.  Psychiatric: He has a normal mood and affect. His behavior is normal. Judgment and thought content normal.  Nursing note and vitals reviewed.   There were no vitals taken for this visit. Wt Readings from Last 3 Encounters:  11/03/18  228 lb 9.6 oz (103.7 kg)  10/31/18 229 lb 6.4 oz (104.1 kg)  10/21/18 234 lb 3.2 oz (106.2 kg)     Health Maintenance Due  Topic Date Due  . OPHTHALMOLOGY EXAM  08/07/1973  . HEMOGLOBIN A1C  04/21/2019  . INFLUENZA VACCINE  07/11/2019  . FOOT EXAM  10/22/2019    There are no preventive care reminders to display for this patient.  Lab Results  Component Value Date   TSH 1.680 10/21/2018   Lab Results  Component Value Date   WBC 7.7 10/21/2018   HGB 15.2 10/21/2018   HCT 44.0 10/21/2018  MCV 91 10/21/2018   PLT 318 10/21/2018   Lab Results  Component Value Date   NA 142 10/21/2018   K 4.6 10/21/2018   CO2 18 (L) 10/21/2018   GLUCOSE 118 (H) 10/21/2018   BUN 15 10/21/2018   CREATININE 1.16 10/21/2018   BILITOT 0.7 10/21/2018   ALKPHOS 65 10/21/2018   AST 26 10/21/2018   ALT 27 10/21/2018   PROT 7.9 10/21/2018   ALBUMIN 5.0 10/21/2018   CALCIUM 10.1 10/21/2018   ANIONGAP 11 07/17/2017   Lab Results  Component Value Date   CHOL 237 (H) 10/21/2018   Lab Results  Component Value Date   HDL 40 10/21/2018   Lab Results  Component Value Date   LDLCALC 161 (H) 10/21/2018   Lab Results  Component Value Date   TRIG 179 (H) 10/21/2018   Lab Results  Component Value Date   CHOLHDL 5.9 (H) 10/21/2018   Lab Results  Component Value Date   HGBA1C 7.1 (A) 10/21/2018      Assessment & Plan:   Problem List Items Addressed This Visit      Cardiovascular and Mediastinum   HTN (hypertension) (Chronic)   Relevant Medications   amLODipine (NORVASC) 5 MG tablet   atorvastatin (LIPITOR) 40 MG tablet   telmisartan-hydrochlorothiazide (MICARDIS HCT) 80-25 MG tablet   Other Relevant Orders   Comprehensive metabolic panel   CBC with Differential   TSH     Other   Gout   Relevant Orders   Uric Acid    Other Visit Diagnoses    Hyperlipidemia, unspecified hyperlipidemia type    -  Primary   Relevant Medications   amLODipine (NORVASC) 5 MG tablet    atorvastatin (LIPITOR) 40 MG tablet   telmisartan-hydrochlorothiazide (MICARDIS HCT) 80-25 MG tablet   Other Relevant Orders   Lipid Panel   History of gout       Relevant Medications   allopurinol (ZYLOPRIM) 100 MG tablet   Type 2 diabetes mellitus without complication, without long-term current use of insulin (HCC)       Relevant Medications   atorvastatin (LIPITOR) 40 MG tablet   metFORMIN (GLUCOPHAGE) 1000 MG tablet   telmisartan-hydrochlorothiazide (MICARDIS HCT) 80-25 MG tablet      Meds ordered this encounter  Medications  . allopurinol (ZYLOPRIM) 100 MG tablet    Sig: Take 1 tablet (100 mg total) by mouth daily.    Dispense:  90 tablet    Refill:  3    Order Specific Question:   Supervising Provider    Answer:   Delia Chimes A [id number]  . amLODipine (NORVASC) 5 MG tablet    Sig: Take 1 tablet (5 mg total) by mouth daily. Has not been in or made appointment, may deny next request.    Dispense:  90 tablet    Refill:  1    Order Specific Question:   Supervising Provider    Answer:   Delia Chimes A [id number]  . atorvastatin (LIPITOR) 40 MG tablet    Sig: Take 1 tablet (40 mg total) by mouth daily.    Dispense:  90 tablet    Refill:  1    Order Specific Question:   Supervising Provider    Answer:   Delia Chimes A [id number]  . metFORMIN (GLUCOPHAGE) 1000 MG tablet    Sig: TAKE 1 TABLET BY MOUTH TWICE A DAY WITH A MEAL    Dispense:  180 tablet    Refill:  1    Order  Specific Question:   Supervising Provider    Answer:   Forrest Moron [id number]  . telmisartan-hydrochlorothiazide (MICARDIS HCT) 80-25 MG tablet    Sig: Take 1 tablet by mouth daily.    Dispense:  90 tablet    Refill:  1    Order Specific Question:   Supervising Provider    Answer:   Forrest Moron [[id number]]    Follow-up: Return in 3 mos for med check/chronic conditions  PLAN   POCT a1c at  Pt will return for fasting labs  3 mo follow up - ideally CPE at that time, but up to patient's  discretion  Referral sent for ophthalmology  Pt declined influenza vaccination today  Meds refilled x 6 mo in case his insurance runs out  Discussed medication compliance   Patient encouraged to call clinic with any questions, comments, or concerns.  I spent 22 minutes with this patient, more than 50% of which was spent counseling / educating.  Guy Coss, NP

## 2019-12-23 ENCOUNTER — Other Ambulatory Visit: Payer: Self-pay

## 2019-12-23 ENCOUNTER — Ambulatory Visit (INDEPENDENT_AMBULATORY_CARE_PROVIDER_SITE_OTHER): Payer: BC Managed Care – PPO | Admitting: Family Medicine

## 2019-12-23 DIAGNOSIS — E785 Hyperlipidemia, unspecified: Secondary | ICD-10-CM

## 2019-12-23 DIAGNOSIS — M109 Gout, unspecified: Secondary | ICD-10-CM

## 2019-12-23 DIAGNOSIS — I10 Essential (primary) hypertension: Secondary | ICD-10-CM

## 2019-12-24 LAB — CBC WITH DIFFERENTIAL/PLATELET
Basophils Absolute: 0.1 10*3/uL (ref 0.0–0.2)
Basos: 1 %
EOS (ABSOLUTE): 0.2 10*3/uL (ref 0.0–0.4)
Eos: 2 %
Hematocrit: 45.2 % (ref 37.5–51.0)
Hemoglobin: 14.9 g/dL (ref 13.0–17.7)
Immature Grans (Abs): 0 10*3/uL (ref 0.0–0.1)
Immature Granulocytes: 1 %
Lymphocytes Absolute: 2.5 10*3/uL (ref 0.7–3.1)
Lymphs: 32 %
MCH: 30.5 pg (ref 26.6–33.0)
MCHC: 33 g/dL (ref 31.5–35.7)
MCV: 92 fL (ref 79–97)
Monocytes Absolute: 0.7 10*3/uL (ref 0.1–0.9)
Monocytes: 8 %
Neutrophils Absolute: 4.4 10*3/uL (ref 1.4–7.0)
Neutrophils: 56 %
Platelets: 291 10*3/uL (ref 150–450)
RBC: 4.89 x10E6/uL (ref 4.14–5.80)
RDW: 12.6 % (ref 11.6–15.4)
WBC: 7.8 10*3/uL (ref 3.4–10.8)

## 2019-12-24 LAB — COMPREHENSIVE METABOLIC PANEL
ALT: 26 IU/L (ref 0–44)
AST: 21 IU/L (ref 0–40)
Albumin/Globulin Ratio: 1.6 (ref 1.2–2.2)
Albumin: 4.6 g/dL (ref 3.8–4.9)
Alkaline Phosphatase: 63 IU/L (ref 39–117)
BUN/Creatinine Ratio: 16 (ref 9–20)
BUN: 21 mg/dL (ref 6–24)
Bilirubin Total: 0.5 mg/dL (ref 0.0–1.2)
CO2: 23 mmol/L (ref 20–29)
Calcium: 10.3 mg/dL — ABNORMAL HIGH (ref 8.7–10.2)
Chloride: 99 mmol/L (ref 96–106)
Creatinine, Ser: 1.31 mg/dL — ABNORMAL HIGH (ref 0.76–1.27)
GFR calc Af Amer: 70 mL/min/{1.73_m2} (ref >59)
GFR calc non Af Amer: 60 mL/min/{1.73_m2} (ref >59)
Globulin, Total: 2.9 g/dL (ref 1.5–4.5)
Glucose: 189 mg/dL — ABNORMAL HIGH (ref 65–99)
Potassium: 5 mmol/L (ref 3.5–5.2)
Sodium: 137 mmol/L (ref 134–144)
Total Protein: 7.5 g/dL (ref 6.0–8.5)

## 2019-12-24 LAB — LIPID PANEL
Chol/HDL Ratio: 7 ratio — ABNORMAL HIGH (ref 0.0–5.0)
Cholesterol, Total: 252 mg/dL — ABNORMAL HIGH (ref 100–199)
HDL: 36 mg/dL — ABNORMAL LOW (ref >39)
LDL Chol Calc (NIH): 149 mg/dL — ABNORMAL HIGH (ref 0–99)
Triglycerides: 358 mg/dL — ABNORMAL HIGH (ref 0–149)
VLDL Cholesterol Cal: 67 mg/dL — ABNORMAL HIGH (ref 5–40)

## 2019-12-24 LAB — TSH: TSH: 0.908 u[IU]/mL (ref 0.450–4.500)

## 2019-12-24 LAB — URIC ACID: Uric Acid: 9.1 mg/dL — ABNORMAL HIGH (ref 3.8–8.4)

## 2019-12-28 NOTE — Progress Notes (Signed)
Dr Carlota Raspberry Clarnce Flock Mr [place] for med refills last week. Of note, he had run out of atorvastatin and allopurinol. I refilled these. Hopefully this will help with his uric acid and lipids.  Additionally, his creatinine has jumped to 1.31 - his GFR is holding on to normal range, though. He is scheduled with you on April 12 for a follow up. If you think he should follow up any sooner, let me know, I'd be happy to see him in the mean time.  Thanks,  Kathrin Ruddy, NP

## 2020-01-16 ENCOUNTER — Other Ambulatory Visit: Payer: Self-pay

## 2020-01-16 ENCOUNTER — Emergency Department (HOSPITAL_COMMUNITY): Payer: Self-pay

## 2020-01-16 ENCOUNTER — Emergency Department (HOSPITAL_COMMUNITY)
Admission: EM | Admit: 2020-01-16 | Discharge: 2020-01-16 | Disposition: A | Discharge status: Home / Self Care | Payer: BC Managed Care – PPO | Attending: Emergency Medicine | Admitting: Emergency Medicine

## 2020-01-16 ENCOUNTER — Encounter (HOSPITAL_COMMUNITY): Payer: Self-pay | Admitting: Emergency Medicine

## 2020-01-16 DIAGNOSIS — Y9259 Other trade areas as the place of occurrence of the external cause: Secondary | ICD-10-CM | POA: Insufficient documentation

## 2020-01-16 DIAGNOSIS — I10 Essential (primary) hypertension: Secondary | ICD-10-CM | POA: Insufficient documentation

## 2020-01-16 DIAGNOSIS — Y939 Activity, unspecified: Secondary | ICD-10-CM | POA: Insufficient documentation

## 2020-01-16 DIAGNOSIS — E119 Type 2 diabetes mellitus without complications: Secondary | ICD-10-CM | POA: Insufficient documentation

## 2020-01-16 DIAGNOSIS — Y990 Civilian activity done for income or pay: Secondary | ICD-10-CM | POA: Insufficient documentation

## 2020-01-16 DIAGNOSIS — W208XXA Other cause of strike by thrown, projected or falling object, initial encounter: Secondary | ICD-10-CM | POA: Insufficient documentation

## 2020-01-16 DIAGNOSIS — Z79899 Other long term (current) drug therapy: Secondary | ICD-10-CM | POA: Insufficient documentation

## 2020-01-16 DIAGNOSIS — Z7984 Long term (current) use of oral hypoglycemic drugs: Secondary | ICD-10-CM | POA: Insufficient documentation

## 2020-01-16 DIAGNOSIS — S9032XA Contusion of left foot, initial encounter: Secondary | ICD-10-CM | POA: Insufficient documentation

## 2020-01-16 NOTE — ED Provider Notes (Signed)
Guy Horton Provider Note   CSN: [id number] Arrival date & time: 01/16/20  1553     History Chief Complaint  Patient presents with  . Foot Pain    Guy Horton is a 59 y.o. male.  59 year old male presents with complaint of left foot pain.  Patient states that an object dropped on the foot at work yesterday.  He was wearing steel toe boots.  Complains of pain at the base of the third and fourth toes.  Denies any ankle pain.  Pain characterizes sharp and worse with walking.  No treatment use prior to arrival.        Past Medical History:  Diagnosis Date  . Arthritis   . Diabetes ([place])   . Gout   . Hyperlipemia   . Hypertension     Patient Active Problem List   Diagnosis Date Noted  . New onset type 2 diabetes mellitus ([name]) 07/16/2017  . Hematoma of right thigh 09/13/2015  . Laceration of lower leg with complication 01/74/9449  . Non compliance w medication regimen 11/20/2014  . Gout 08/13/2012  . HTN (hypertension) 08/13/2012  . Erectile dysfunction 08/13/2012  . Hypercholesteremia 08/13/2012    Past Surgical History:  Procedure Laterality Date  . I & D EXTREMITY Left 11/14/2016   Procedure: IRRIGATION AND DEBRIDEMENT LEFT INDEX FINGER;  Surgeon: [name], MD;  Location: [place];  Service: Orthopedics;  Laterality: Left;       Family History  Problem Relation Age of Onset  . Diabetes Mother   . Other Father 37       Work accident  . Prostate cancer Father   . Colon cancer Neg Hx   . Colon polyps Neg Hx   . Esophageal cancer Neg Hx   . Rectal cancer Neg Hx   . Stomach cancer Neg Hx     Social History   Tobacco Use  . Smoking status: Never Smoker  . Smokeless tobacco: Never Used  Substance Use Topics  . Alcohol use: Yes    Comment: 3-4 beers daily  . Drug use: No    Home Medications Prior to Admission medications   Medication Sig Start Date End Date Taking? Authorizing Provider  allopurinol (ZYLOPRIM)  100 MG tablet Take 1 tablet (100 mg total) by mouth daily. 12/21/19   Maximiano Coss, NP  amLODipine (NORVASC) 5 MG tablet Take 1 tablet (5 mg total) by mouth daily. Has not been in or made appointment, may deny next request. 12/21/19   Maximiano Coss, NP  atorvastatin (LIPITOR) 40 MG tablet Take 1 tablet (40 mg total) by mouth daily. 12/21/19   Maximiano Coss, NP  Blood Glucose Monitoring Suppl (ONETOUCH VERIO) w/Device KIT  07/18/17   [provider]  Blood Glucose Monitoring Suppl (TRUE METRIX METER) DEVI 1 Device by Does not apply route 4 (four) times daily. 07/17/17   [name], MD  glucose blood (TRUE METRIX BLOOD GLUCOSE TEST) test strip Use as instructed 12/05/17   [name], MD  HYDROcodone-acetaminophen (NORCO/VICODIN) 5-325 MG tablet  10/31/18   [provider]  metFORMIN (GLUCOPHAGE) 1000 MG tablet TAKE 1 TABLET BY MOUTH TWICE A DAY WITH A MEAL 12/21/19   Maximiano Coss, NP  telmisartan-hydrochlorothiazide (MICARDIS HCT) 80-25 MG tablet Take 1 tablet by mouth daily. 12/21/19   Maximiano Coss, NP  TRUEPLUS LANCETS 28G MISC 1 each by Does not apply route 4 (four) times daily. 07/17/17   [name], MD  Allergies    Patient has no known allergies.  Review of Systems   Review of Systems  All other systems reviewed and are negative.   Physical Exam Updated Vital Signs BP (!) 156/99   Pulse 90   Temp 99.1 F (37.3 C) (Oral)   Resp 17   SpO2 96%   Physical Exam Vitals and nursing note reviewed.  Constitutional:      Appearance: He is well-developed. He is not toxic-appearing.  HENT:     Head: Normocephalic and atraumatic.  Eyes:     Conjunctiva/sclera: Conjunctivae normal.     Pupils: Pupils are equal, round, and reactive to light.  Cardiovascular:     Rate and Rhythm: Normal rate.  Pulmonary:     Effort: Pulmonary effort is normal.  Musculoskeletal:     Cervical back: Normal range of motion.     Left foot: Tenderness present.        Legs:  Skin:    General: Skin is warm and dry.  Neurological:     Mental Status: He is alert and oriented to person, place, and time.     ED Results / Procedures / Treatments   Labs (all labs ordered are listed, but only abnormal results are displayed) Labs Reviewed - No data to display  EKG None  Radiology No results found.  Procedures Procedures (including critical care time)  Medications Ordered in ED Medications - No data to display  ED Course  I have reviewed the triage vital signs and the nursing notes.  Pertinent labs & imaging results that were available during my care of the patient were reviewed by me and considered in my medical decision making (see chart for details).    MDM Rules/Calculators/A&P                     X-ray of foot negative.  Return precautions given Final Clinical Impression(s) / ED Diagnoses Final diagnoses:  None    Rx / DC Orders ED Discharge Orders    None       Guy Leigh, MD 01/16/20 1704

## 2020-01-16 NOTE — ED Triage Notes (Signed)
Patient reports right foot pain after dropping door on it at work yesterday. Ambulatory.

## 2020-03-21 ENCOUNTER — Ambulatory Visit: Payer: BC Managed Care – PPO | Admitting: Family Medicine

## 2020-03-22 ENCOUNTER — Encounter: Payer: Self-pay | Admitting: Family Medicine

## 2020-04-06 ENCOUNTER — Other Ambulatory Visit: Payer: Self-pay

## 2020-04-06 ENCOUNTER — Encounter (HOSPITAL_COMMUNITY): Payer: Self-pay | Admitting: Emergency Medicine

## 2020-04-06 ENCOUNTER — Telehealth (INDEPENDENT_AMBULATORY_CARE_PROVIDER_SITE_OTHER): Payer: Self-pay | Admitting: Family Medicine

## 2020-04-06 ENCOUNTER — Emergency Department (HOSPITAL_COMMUNITY): Payer: BC Managed Care – PPO

## 2020-04-06 ENCOUNTER — Ambulatory Visit: Payer: Self-pay

## 2020-04-06 ENCOUNTER — Emergency Department (HOSPITAL_COMMUNITY)
Admission: EM | Admit: 2020-04-06 | Discharge: 2020-04-06 | Disposition: A | Discharge status: Home / Self Care | Payer: BC Managed Care – PPO | Attending: Emergency Medicine | Admitting: Emergency Medicine

## 2020-04-06 DIAGNOSIS — R079 Chest pain, unspecified: Secondary | ICD-10-CM | POA: Insufficient documentation

## 2020-04-06 DIAGNOSIS — E1165 Type 2 diabetes mellitus with hyperglycemia: Secondary | ICD-10-CM

## 2020-04-06 DIAGNOSIS — Z5321 Procedure and treatment not carried out due to patient leaving prior to being seen by health care provider: Secondary | ICD-10-CM | POA: Insufficient documentation

## 2020-04-06 LAB — CBC
HCT: 44.3 % (ref 39.0–52.0)
Hemoglobin: 15.3 g/dL (ref 13.0–17.0)
MCH: 32.3 pg (ref 26.0–34.0)
MCHC: 34.5 g/dL (ref 30.0–36.0)
MCV: 93.5 fL (ref 80.0–100.0)
Platelets: 266 10*3/uL (ref 150–400)
RBC: 4.74 MIL/uL (ref 4.22–5.81)
RDW: 11.6 % (ref 11.5–15.5)
WBC: 7.7 10*3/uL (ref 4.0–10.5)
nRBC: 0 % (ref 0.0–0.2)

## 2020-04-06 LAB — BASIC METABOLIC PANEL
Anion gap: 13 (ref 5–15)
BUN: 24 mg/dL — ABNORMAL HIGH (ref 6–20)
CO2: 20 mmol/L — ABNORMAL LOW (ref 22–32)
Calcium: 9.7 mg/dL (ref 8.9–10.3)
Chloride: 97 mmol/L — ABNORMAL LOW (ref 98–111)
Creatinine, Ser: 1.35 mg/dL — ABNORMAL HIGH (ref 0.61–1.24)
GFR calc Af Amer: >60 mL/min (ref >60)
GFR calc non Af Amer: 58 mL/min — ABNORMAL LOW (ref >60)
Glucose, Bld: 411 mg/dL — ABNORMAL HIGH (ref 70–99)
Potassium: 4.8 mmol/L (ref 3.5–5.1)
Sodium: 130 mmol/L — ABNORMAL LOW (ref 135–145)

## 2020-04-06 LAB — GLUCOSE, CAPILLARY: Glucose-Capillary: 253 mg/dL — ABNORMAL HIGH (ref 70–99)

## 2020-04-06 LAB — TROPONIN I (HIGH SENSITIVITY): Troponin I (High Sensitivity): 9 ng/L (ref <18)

## 2020-04-06 MED ORDER — SODIUM CHLORIDE 0.9% FLUSH: 3.0000 mL | Freq: Once | INTRAVENOUS | Status: DC

## 2020-04-06 MED ORDER — GLIPIZIDE 5 MG PO TABS: 5.0000 mg | ORAL_TABLET | Freq: Every day | ORAL | 0 refills | Status: DC

## 2020-04-06 MED ORDER — SODIUM CHLORIDE 0.9 % IV BOLUS
1000.0000 mL | Freq: Once | INTRAVENOUS | Status: AC
  Administered 2020-04-06: 1000 mL via INTRAVENOUS

## 2020-04-06 NOTE — Progress Notes (Signed)
Virtual Visit via audio Note  I connected with Guy Horton on 04/06/20 at 1:33 PM by audio (unable to connect to video)  telemedicine application and verified that I am speaking with the correct person using two identifiers.   I discussed the limitations, risks, security and privacy concerns of performing an evaluation and management service by telephone and the availability of in person appointments. I also discussed with the patient that there may be a patient responsible charge related to this service. The patient expressed understanding and agreed to proceed, consent obtained  Chief complaint:  Chief Complaint  Patient presents with  . Medication Refill    glipizide pt states his sugars 130 and has been feeling well      History of Present Illness: Guy Horton is a 59 y.o. male   Virtual visit for medication refills.   Gout: Discussed at January visit with Maximiano Coss.  Had not been seen since November 2019 at that time.  Allopurinol had been taking intermittently.  No recent flares at that time. Lab Results  Component Value Date   LABURIC 9.1 (H) 12/23/2019  taking allopurinol daily, no new side effects. No recent flare. Has colchicine if needed.  Diabetes: Complicated by hyperglycemia and medication nonadherence.  Intermittent Metformin usage at January visit with Kathrin Ruddy.  Home readings 117-125 at that time. Statin had been prescribed previously but out of medication January visit. Restarted metformin 109m BID, but was taking one in the am only - forgetting evening dose daily. Off glipizide past year.  Home readings ok - 135-150. Ate some sweets and higher readings this week - 235 in the mornings, 259. More sweets last night. Chest pain few days ago. Seen in ER today. No further pain now. Testing reportedly ok in ER. Went to ER glucose 411, then 253 with IVF. Vision blurry past few days only with elevated readings.    Lab Results  Component Value Date   HGBA1C 8.2 (A) 12/21/2019   HGBA1C 7.1 (A) 10/21/2018   HGBA1C 7.0 (H) 10/04/2017   Lab Results  Component Value Date   LDLCALC 149 (H) 12/23/2019   CREATININE 1.35 (H) 04/06/2020      Patient Active Problem List   Diagnosis Date Noted  . New onset type 2 diabetes mellitus ([facility] 07/16/2017  . Hematoma of right thigh 09/13/2015  . Laceration of lower leg with complication 138/93/7342 . Non compliance w medication regimen 11/20/2014  . Gout 08/13/2012  . HTN (hypertension) 08/13/2012  . Erectile dysfunction 08/13/2012  . Hypercholesteremia 08/13/2012   Past Medical History:  Diagnosis Date  . Arthritis   . Diabetes ([place]   . Gout   . Hyperlipemia   . Hypertension    Past Surgical History:  Procedure Laterality Date  . I & D EXTREMITY Left 11/14/2016   Procedure: IRRIGATION AND DEBRIDEMENT LEFT INDEX FINGER;  Surgeon: FIran Planas MD;  [facility]  Service: Orthopedics;  Laterality: Left;   No Known Allergies Prior to Admission medications   Medication Sig Start Date End Date Taking? Authorizing Provider  allopurinol (ZYLOPRIM) 100 MG tablet Take 1 tablet (100 mg total) by mouth daily. 12/21/19  Yes MMaximiano Coss NP  amLODipine (NORVASC) 5 MG tablet Take 1 tablet (5 mg total) by mouth daily. Has not been in or made appointment, may deny next request. 12/21/19  Yes MMaximiano Coss NP  atorvastatin (LIPITOR) 40 MG tablet Take 1 tablet (40 mg total) by mouth daily. 12/21/19  Yes MOrland Mustard  Richard, NP  Blood Glucose Monitoring Suppl (ONETOUCH VERIO) w/Device KIT  07/18/17  Yes [provider]  Blood Glucose Monitoring Suppl (TRUE METRIX METER) DEVI 1 Device by Does not apply route 4 (four) times daily. 07/17/17  Yes [name], MD  glucose blood (TRUE METRIX BLOOD GLUCOSE TEST) test strip Use as instructed 12/05/17  Yes [name], MD  HYDROcodone-acetaminophen (NORCO/VICODIN) 5-325 MG tablet  10/31/18  Yes [provider]  metFORMIN (GLUCOPHAGE)  1000 MG tablet TAKE 1 TABLET BY MOUTH TWICE A DAY WITH A MEAL 12/21/19  Yes Maximiano Coss, NP  telmisartan-hydrochlorothiazide (MICARDIS HCT) 80-25 MG tablet Take 1 tablet by mouth daily. 12/21/19  Yes Maximiano Coss, NP  TRUEPLUS LANCETS 28G MISC 1 each by Does not apply route 4 (four) times daily. 07/17/17  Yes [name], MD   Social History   Socioeconomic History  . Marital status: Married    Spouse name: Not on file  . Number of children: 2  . Years of education: Not on file  . Highest education level: Not on file  Occupational History  . Occupation: Games developer  Tobacco Use  . Smoking status: Never Smoker  . Smokeless tobacco: Never Used  Substance and Sexual Activity  . Alcohol use: Yes    Comment: 3-4 beers daily  . Drug use: No  . Sexual activity: Yes  Other Topics Concern  . Not on file  Social History Narrative  . Not on file   Social Determinants of Health   Financial Resource Strain:   . Difficulty of Paying Living Expenses:   Food Insecurity:   . Worried About Charity fundraiser in the Last Year:   . Arboriculturist in the Last Year:   Transportation Needs:   . Film/video editor (Medical):   Marland Kitchen Lack of Transportation (Non-Medical):   Physical Activity:   . Days of Exercise per Week:   . Minutes of Exercise per Session:   Stress:   . Feeling of Stress :   Social Connections:   . Frequency of Communication with Friends and Family:   . Frequency of Social Gatherings with Friends and Family:   . Attends Religious Services:   . Active Member of Clubs or Organizations:   . Attends Archivist Meetings:   Marland Kitchen Marital Status:   Intimate Partner Violence:   . Fear of Current or Ex-Partner:   . Emotionally Abused:   Marland Kitchen Physically Abused:   . Sexually Abused:     Observations/Objective: There were no vitals filed for this visit. No distress, appropriate responses.  All questions answered and understanding of plan expressed including  teach back approach.  Assessment and Plan: Type 2 diabetes mellitus with hyperglycemia, without long-term current use of insulin (HCC) - Plan: glipiZIDE (GLUCOTROL) 5 MG tablet  -Hyperglycemia recently likely related to diet choices with increased sugars, and has been nonadherent to second dose of Metformin in the evening.  Treatment options discussed, he feels like he would be able to set alarm to remember second dose of Metformin as well as cut back on high glycemic foods/snacks.  Close monitoring of readings recommended, and if readings remain above 200 the next few days, can add back glipizide but potential hypoglycemia precautions given.  Recheck 1 week.   Briefly reviewed ER notes, troponins okay, was discharged home.  Asymptomatic at present.  ER/911 chest pain precautions discussed  Follow Up Instructions:   1 week.   I discussed  the assessment and treatment plan with the patient. The patient was provided an opportunity to ask questions and all were answered. The patient agreed with the plan and demonstrated an understanding of the instructions.   The patient was advised to call back or seek an in-person evaluation if the symptoms worsen or if the condition fails to improve as anticipated.  I provided 17 minutes of non-face-to-face time during this encounter.   [name], MD

## 2020-04-06 NOTE — Telephone Encounter (Signed)
Patient called stating that he has had chest pain for 1 week He states that it comes and goes and has increased. He states that he tried treating with antiacid. The pain stops him and last about 2 minutes.  It goes into his shoulder blade rt side. He denies other symptoms. Per protocol patient will go to ER for evaluation. Patient states that he is at [facility] now. Patient urged to go in to ER and have this evaluated. He verbalized understanding and will go in now. Reason for Disposition . Pain also in shoulder(s) or arm(s) or jaw  Answer Assessment - Initial Assessment Questions 1. LOCATION: "Where does it hurt?"       center 2. RADIATION: "Does the pain go anywhere else?" (e.g., into neck, jaw, arms, back)    Rt shoulder to shoulder blade 3. ONSET: "When did the chest pain begin?" (Minutes, hours or days)      1 week 4. PATTERN "Does the pain come and go, or has it been constant since it started?"  "Does it get worse with exertion?"    Comes and goes 5. DURATION: "How long does it last" (e.g., seconds, minutes, hours)    2 minutes 6. SEVERITY: "How bad is the pain?"  (e.g., Scale 1-10; mild, moderate, or severe)    - MILD (1-3): doesn't interfere with normal activities     - MODERATE (4-7): interferes with normal activities or awakens from sleep    - SEVERE (8-10): excruciating pain, unable to do any normal activities      Stops for a minute 7. CARDIAC RISK FACTORS: "Do you have any history of heart problems or risk factors for heart disease?" (e.g., angina, prior heart attack; diabetes, high blood pressure, high cholesterol, smoker, or strong family history of heart disease)     HTN DM 8. PULMONARY RISK FACTORS: "Do you have any history of lung disease?"  (e.g., blood clots in lung, asthma, emphysema, birth control pills)    no 9. CAUSE: "What do you think is causing the chest pain?"     Not indigestion 10. OTHER SYMPTOMS: "Do you have any other symptoms?" (e.g., dizziness,  nausea, vomiting, sweating, fever, difficulty breathing, cough)       No  11. PREGNANCY: "Is there any chance you are pregnant?" "When was your last menstrual period?"      N/A  Protocols used: CHEST PAIN-A-AH

## 2020-04-06 NOTE — ED Triage Notes (Signed)
Pt reports that over the past 2 weeks had intermittent central chest pains that last 2 minutes and then go away. Reports that has been taking TUms that doesnt help.

## 2020-04-06 NOTE — Patient Instructions (Addendum)
Restart metformin at nighttime as well. If blood sugars are not below 250 in next 2 days, then add glipizide once per day. This will be on hold at pharmacy. Drink water to stay cool - avoid sweets.    If you have lab work done today you will be contacted with your lab results within the next 2 weeks.  If you have not heard from [place] then please contact us. The fastest way to get your results is to register for My Chart.   IF you received an x-ray today, you will receive an invoice from [facility]. Please contact [facility] at [phone] with questions or concerns regarding your invoice.   IF you received labwork today, you will receive an invoice from [facility]. Please contact [facility] at [phone] with questions or concerns regarding your invoice.   Our billing staff will not be able to assist you with questions regarding bills from these companies.  You will be contacted with the lab results as soon as they are available. The fastest way to get your results is to activate your My Chart account. Instructions are located on the last page of this paperwork. If you have not heard from [place] regarding the results in 2 weeks, please contact this office.

## 2020-04-06 NOTE — Telephone Encounter (Signed)
Pt has video appt today. Concerns will be addressed during visit.

## 2020-04-11 NOTE — Progress Notes (Signed)
Called pt to schedule 1 week f/u with pcp. LVMTCB

## 2020-04-14 ENCOUNTER — Telehealth: Payer: Self-pay

## 2020-04-18 NOTE — Telephone Encounter (Signed)
No ation required at this time, closing encounter from inbox 

## 2020-05-05 ENCOUNTER — Other Ambulatory Visit: Payer: Self-pay | Admitting: Family Medicine

## 2020-05-05 DIAGNOSIS — E1165 Type 2 diabetes mellitus with hyperglycemia: Secondary | ICD-10-CM

## 2020-06-10 ENCOUNTER — Other Ambulatory Visit: Payer: Self-pay | Admitting: Family Medicine

## 2020-06-10 DIAGNOSIS — E1165 Type 2 diabetes mellitus with hyperglycemia: Secondary | ICD-10-CM

## 2020-07-04 ENCOUNTER — Other Ambulatory Visit: Payer: Self-pay | Admitting: Registered Nurse

## 2020-07-04 DIAGNOSIS — E119 Type 2 diabetes mellitus without complications: Secondary | ICD-10-CM

## 2020-10-01 ENCOUNTER — Other Ambulatory Visit: Payer: Self-pay | Admitting: Family Medicine

## 2020-10-01 DIAGNOSIS — E1165 Type 2 diabetes mellitus with hyperglycemia: Secondary | ICD-10-CM

## 2020-10-01 NOTE — Telephone Encounter (Signed)
Requested medication (s) are due for refill today: yes  Requested medication (s) are on the active medication list: yes  Last refill:  06/10/20  Future visit scheduled: no  Notes to clinic:  overdue lab work   Requested Prescriptions  Pending Prescriptions Disp Refills   glipiZIDE (GLUCOTROL) 5 MG tablet [Pharmacy Med Name: GLIPIZIDE 5 MG TABLET] 90 tablet 0    Sig: TAKE 1 TAB DAILY BEFORE BREAKFAST. IF BLOOD SUGAR >200 WITH METFORM TAKE 2XDAILY      Endocrinology:  Diabetes - Sulfonylureas Failed - 10/01/2020  8:56 AM      Failed - HBA1C is between 0 and 7.9 and within 180 days    Hemoglobin A1C  Date Value Ref Range Status  12/21/2019 8.2 (A) 4.0 - 5.6 % Final   Hgb A1c MFr Bld  Date Value Ref Range Status  10/04/2017 7.0 (H) 4.8 - 5.6 % Final    Comment:             Prediabetes: 5.7 - 6.4          Diabetes: >6.4          Glycemic control for adults with diabetes: <7.0           Passed - Valid encounter within last 6 months    Recent Outpatient Visits           5 months ago Type 2 diabetes mellitus with hyperglycemia, without long-term current use of insulin [facility])   [facility] [place], MD   9 months ago Hyperlipidemia, unspecified hyperlipidemia type   [facility] [place], Ranell Patrick, MD   9 months ago Type 2 diabetes mellitus without complication, without long-term current use of insulin [facility])   [facility], Delfino Lovett, NP   1 year ago Paronychia of great toe, left   [facility] at [place], MD   1 year ago Paronychia of great toe of left foot   [facility], Ranell Patrick, MD

## 2020-12-07 ENCOUNTER — Other Ambulatory Visit: Payer: Self-pay | Admitting: Registered Nurse

## 2020-12-07 DIAGNOSIS — I10 Essential (primary) hypertension: Secondary | ICD-10-CM

## 2020-12-28 ENCOUNTER — Ambulatory Visit: Payer: Self-pay

## 2020-12-28 ENCOUNTER — Other Ambulatory Visit: Payer: Self-pay

## 2020-12-28 ENCOUNTER — Encounter (HOSPITAL_COMMUNITY): Payer: Self-pay | Admitting: Emergency Medicine

## 2020-12-28 DIAGNOSIS — Z5321 Procedure and treatment not carried out due to patient leaving prior to being seen by health care provider: Secondary | ICD-10-CM | POA: Insufficient documentation

## 2020-12-28 DIAGNOSIS — R739 Hyperglycemia, unspecified: Secondary | ICD-10-CM | POA: Insufficient documentation

## 2020-12-28 DIAGNOSIS — H538 Other visual disturbances: Secondary | ICD-10-CM | POA: Diagnosis not present

## 2020-12-28 LAB — CBC
HCT: 45.5 % (ref 39.0–52.0)
Hemoglobin: 16.2 g/dL (ref 13.0–17.0)
MCH: 32.5 pg (ref 26.0–34.0)
MCHC: 35.6 g/dL (ref 30.0–36.0)
MCV: 91.2 fL (ref 80.0–100.0)
Platelets: 227 10*3/uL (ref 150–400)
RBC: 4.99 MIL/uL (ref 4.22–5.81)
RDW: 11.6 % (ref 11.5–15.5)
WBC: 5.7 10*3/uL (ref 4.0–10.5)
nRBC: 0 % (ref 0.0–0.2)

## 2020-12-28 LAB — BASIC METABOLIC PANEL
Anion gap: 14 (ref 5–15)
BUN: 38 mg/dL — ABNORMAL HIGH (ref 6–20)
CO2: 21 mmol/L — ABNORMAL LOW (ref 22–32)
Calcium: 9.5 mg/dL (ref 8.9–10.3)
Chloride: 92 mmol/L — ABNORMAL LOW (ref 98–111)
Creatinine, Ser: 2.57 mg/dL — ABNORMAL HIGH (ref 0.61–1.24)
GFR, Estimated: 28 mL/min — ABNORMAL LOW (ref >60)
Glucose, Bld: 537 mg/dL (ref 70–99)
Potassium: 6 mmol/L — ABNORMAL HIGH (ref 3.5–5.1)
Sodium: 127 mmol/L — ABNORMAL LOW (ref 135–145)

## 2020-12-28 LAB — CBG MONITORING, ED
Glucose-Capillary: 515 mg/dL (ref 70–99)
Glucose-Capillary: 386 mg/dL — ABNORMAL HIGH (ref 70–99)

## 2020-12-28 NOTE — ED Triage Notes (Signed)
Patient states he felt a cold the other day so he took some medicine and drank a lot of juice a few days ago, his blood sugar has been from 400-500s since the excess juice intake. Takes metformin, no prescribed any insulin. Endorses blurred vision since hyperglycemia onset.

## 2020-12-28 NOTE — Telephone Encounter (Signed)
Patient called stating that he noticed his Blood sugar was high over 200s yesterday.  Today he is 507. He states that he has had cold symptoms and was taking OTC meds alka seltzer. He states he has no symptoms but his vision is blurred. Per protocol patient will go to ER with elevated BD above 500. Care advice read to patient. He agrees with plan of care.   Reason for Disposition . Blood glucose > 500 mg/dL (27.8 mmol/L)  Answer Assessment - Initial Assessment Questions 1. BLOOD GLUCOSE: "What is your blood glucose level?"     507 2. ONSET: "When did you check the blood glucose?"    24 hours  3. USUAL RANGE: "What is your glucose level usually?" (e.g., usual fasting morning value, usual evening value)     157 4. KETONES: "Do you check for ketones (urine or blood test strips)?" If yes, ask: "What does the test show now?"     no 5. TYPE 1 or 2:  "Do you know what type of diabetes you have?"  (e.g., Type 1, Type 2, Gestational; doesn't know)     1 6. INSULIN: "Do you take insulin?" "What type of insulin(s) do you use? What is the mode of delivery? (syringe, pen; injection or pump)?"     none 7. DIABETES PILLS: "Do you take any pills for your diabetes?" If yes, ask: "Have you missed taking any pills recently?"     No not missed any 8. OTHER SYMPTOMS: "Do you have any symptoms?" (e.g., fever, frequent urination, difficulty breathing, dizziness, weakness, vomiting)     Blurred vision 9. PREGNANCY: "Is there any chance you are pregnant?" "When was your last menstrual period?"     N/A  Protocols used: DIABETES - HIGH BLOOD SUGAR-A-AH

## 2020-12-29 ENCOUNTER — Emergency Department (HOSPITAL_COMMUNITY)
Admission: EM | Admit: 2020-12-29 | Discharge: 2020-12-29 | Disposition: A | Discharge status: Home / Self Care | Payer: BC Managed Care – PPO | Attending: Emergency Medicine | Admitting: Emergency Medicine

## 2021-01-06 ENCOUNTER — Other Ambulatory Visit: Payer: Self-pay | Admitting: Family Medicine

## 2021-01-06 DIAGNOSIS — I10 Essential (primary) hypertension: Secondary | ICD-10-CM

## 2021-02-17 ENCOUNTER — Other Ambulatory Visit: Payer: Self-pay | Admitting: Registered Nurse

## 2021-02-17 DIAGNOSIS — Z8739 Personal history of other diseases of the musculoskeletal system and connective tissue: Secondary | ICD-10-CM

## 2021-07-16 ENCOUNTER — Other Ambulatory Visit: Payer: Self-pay | Admitting: Family Medicine

## 2021-07-16 DIAGNOSIS — I10 Essential (primary) hypertension: Secondary | ICD-10-CM

## 2021-08-09 ENCOUNTER — Other Ambulatory Visit: Payer: Self-pay | Admitting: Family Medicine

## 2021-08-09 DIAGNOSIS — E1165 Type 2 diabetes mellitus with hyperglycemia: Secondary | ICD-10-CM

## 2021-08-12 ENCOUNTER — Other Ambulatory Visit: Payer: Self-pay | Admitting: Family Medicine

## 2021-08-12 DIAGNOSIS — I10 Essential (primary) hypertension: Secondary | ICD-10-CM

## 2021-09-14 ENCOUNTER — Other Ambulatory Visit: Payer: Self-pay | Admitting: Family Medicine

## 2021-09-14 DIAGNOSIS — E119 Type 2 diabetes mellitus without complications: Secondary | ICD-10-CM

## 2021-09-14 DIAGNOSIS — Z8739 Personal history of other diseases of the musculoskeletal system and connective tissue: Secondary | ICD-10-CM

## 2021-09-17 ENCOUNTER — Other Ambulatory Visit: Payer: Self-pay | Admitting: Family Medicine

## 2021-09-17 DIAGNOSIS — I10 Essential (primary) hypertension: Secondary | ICD-10-CM

## 2021-10-13 ENCOUNTER — Other Ambulatory Visit: Payer: Self-pay | Admitting: Family Medicine

## 2021-10-13 DIAGNOSIS — I10 Essential (primary) hypertension: Secondary | ICD-10-CM

## 2021-12-17 ENCOUNTER — Other Ambulatory Visit: Payer: Self-pay | Admitting: Family Medicine

## 2021-12-17 DIAGNOSIS — E119 Type 2 diabetes mellitus without complications: Secondary | ICD-10-CM

## 2021-12-17 DIAGNOSIS — Z8739 Personal history of other diseases of the musculoskeletal system and connective tissue: Secondary | ICD-10-CM

## 2022-06-04 ENCOUNTER — Emergency Department (HOSPITAL_COMMUNITY): Payer: 59

## 2022-06-04 ENCOUNTER — Other Ambulatory Visit: Payer: Self-pay

## 2022-06-04 ENCOUNTER — Emergency Department (HOSPITAL_COMMUNITY)
Admission: EM | Admit: 2022-06-04 | Discharge: 2022-06-04 | Disposition: A | Discharge status: Home / Self Care | Payer: 59 | Attending: Emergency Medicine | Admitting: Emergency Medicine

## 2022-06-04 DIAGNOSIS — J9811 Atelectasis: Secondary | ICD-10-CM | POA: Diagnosis not present

## 2022-06-04 DIAGNOSIS — R0789 Other chest pain: Secondary | ICD-10-CM | POA: Diagnosis not present

## 2022-06-04 DIAGNOSIS — E1165 Type 2 diabetes mellitus with hyperglycemia: Secondary | ICD-10-CM | POA: Diagnosis not present

## 2022-06-04 DIAGNOSIS — R739 Hyperglycemia, unspecified: Secondary | ICD-10-CM | POA: Diagnosis not present

## 2022-06-04 DIAGNOSIS — R079 Chest pain, unspecified: Secondary | ICD-10-CM | POA: Diagnosis not present

## 2022-06-04 DIAGNOSIS — I10 Essential (primary) hypertension: Secondary | ICD-10-CM | POA: Insufficient documentation

## 2022-06-04 DIAGNOSIS — Z7984 Long term (current) use of oral hypoglycemic drugs: Secondary | ICD-10-CM | POA: Insufficient documentation

## 2022-06-04 DIAGNOSIS — Z79899 Other long term (current) drug therapy: Secondary | ICD-10-CM | POA: Diagnosis not present

## 2022-06-04 LAB — CBC
HCT: 45.7 % (ref 39.0–52.0)
Hemoglobin: 15.8 g/dL (ref 13.0–17.0)
MCH: 32.4 pg (ref 26.0–34.0)
MCHC: 34.6 g/dL (ref 30.0–36.0)
MCV: 93.6 fL (ref 80.0–100.0)
Platelets: 262 10*3/uL (ref 150–400)
RBC: 4.88 MIL/uL (ref 4.22–5.81)
RDW: 11.7 % (ref 11.5–15.5)
WBC: 8.8 10*3/uL (ref 4.0–10.5)
nRBC: 0 % (ref 0.0–0.2)

## 2022-06-04 LAB — URINALYSIS, ROUTINE W REFLEX MICROSCOPIC
Bacteria, UA: NONE SEEN
Bilirubin Urine: NEGATIVE
Glucose, UA: >=500 mg/dL — AB
Hgb urine dipstick: NEGATIVE
Ketones, ur: 5 mg/dL — AB
Leukocytes,Ua: NEGATIVE
Nitrite: NEGATIVE
Protein, ur: NEGATIVE mg/dL
Specific Gravity, Urine: 1.022 (ref 1.005–1.030)
pH: 5 (ref 5.0–8.0)

## 2022-06-04 LAB — TROPONIN I (HIGH SENSITIVITY)
Troponin I (High Sensitivity): 12 ng/L (ref <18)
Troponin I (High Sensitivity): 14 ng/L (ref <18)

## 2022-06-04 LAB — CBG MONITORING, ED
Glucose-Capillary: 327 mg/dL — ABNORMAL HIGH (ref 70–99)
Glucose-Capillary: 266 mg/dL — ABNORMAL HIGH (ref 70–99)

## 2022-06-04 LAB — BASIC METABOLIC PANEL
Anion gap: 14 (ref 5–15)
BUN: 25 mg/dL — ABNORMAL HIGH (ref 6–20)
CO2: 23 mmol/L (ref 22–32)
Calcium: 9.7 mg/dL (ref 8.9–10.3)
Chloride: 97 mmol/L — ABNORMAL LOW (ref 98–111)
Creatinine, Ser: 1.39 mg/dL — ABNORMAL HIGH (ref 0.61–1.24)
GFR, Estimated: 59 mL/min — ABNORMAL LOW (ref >60)
Glucose, Bld: 300 mg/dL — ABNORMAL HIGH (ref 70–99)
Potassium: 4.5 mmol/L (ref 3.5–5.1)
Sodium: 134 mmol/L — ABNORMAL LOW (ref 135–145)

## 2022-06-04 MED ORDER — IOHEXOL 350 MG/ML SOLN
62.0000 mL | Freq: Once | INTRAVENOUS | Status: AC | PRN
  Administered 2022-06-04: 62 mL via INTRAVENOUS

## 2022-06-04 MED ORDER — PANTOPRAZOLE SODIUM 20 MG PO TBEC: 20.0000 mg | DELAYED_RELEASE_TABLET | Freq: Every day | ORAL | 0 refills | Status: DC

## 2022-06-04 MED ORDER — SODIUM CHLORIDE 0.9 % IV BOLUS
1000.0000 mL | Freq: Once | INTRAVENOUS | Status: AC
  Administered 2022-06-04: 1000 mL via INTRAVENOUS

## 2022-06-04 NOTE — ED Triage Notes (Signed)
Pt with hyperglycemia >300 x 3-4 weeks. Compliant with metformin and glipizide. Also c/o central chest pain, worse with movement x 2 days. 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

## 2022-06-07 ENCOUNTER — Ambulatory Visit (INDEPENDENT_AMBULATORY_CARE_PROVIDER_SITE_OTHER): Payer: Self-pay

## 2022-06-07 NOTE — Telephone Encounter (Signed)
    Chief Complaint: Blood sugar 345. Seen in ED 06/04/22. Cancelled new pt. Appointment for today. States he wants to be seen tomorrow as new pt. No availability. States "I'll find another doctor." Symptoms: n/a Frequency: n/a Pertinent Negatives: Patient denies  Disposition: '[]'$ ED /'[]'$ Urgent Care (no appt availability in office) / '[]'$ Appointment(In office/virtual)/ '[]'$  [place] [place] [facility]/ '[]'$ Home Care/ '[]'$ Refused Recommended Disposition /'[]'$ [facility] [place] [facility]/ '[]'$  Follow-up with PCP Additional Notes:   Answer Assessment - Initial Assessment Questions 1. BLOOD GLUCOSE: "What is your blood glucose level?"      345 2. ONSET: "When did you check the blood glucose?"     Today 3. USUAL RANGE: "What is your glucose level usually?" (e.g., usual fasting morning value, usual evening value)     N/a 4. KETONES: "Do you check for ketones (urine or blood test strips)?" If yes, ask: "What does the test show now?"      No 5. TYPE 1 or 2:  "Do you know what type of diabetes you have?"  (e.g., Type 1, Type 2, Gestational; doesn't know)      Type 2  6. INSULIN: "Do you take insulin?" "What type of insulin(s) do you use? What is the mode of delivery? (syringe, pen; injection or pump)?"      No 7. DIABETES PILLS: "Do you take any pills for your diabetes?" If yes, ask: "Have you missed taking any pills recently?"     Yes 8. OTHER SYMPTOMS: "Do you have any symptoms?" (e.g., fever, frequent urination, difficulty breathing, dizziness, weakness, vomiting)     No 9. PREGNANCY: "Is there any chance you are pregnant?" "When was your last menstrual period?"     N/a  Protocols used: Diabetes - High Blood Sugar-A-AH

## 2022-06-08 ENCOUNTER — Ambulatory Visit: Payer: Self-pay

## 2022-06-08 ENCOUNTER — Telehealth: Payer: 59 | Admitting: Family Medicine

## 2022-06-08 NOTE — Progress Notes (Signed)
[place]  Patient blood sugars are running  300-350 range.  Taking medications, but still having trouble, not on insulin Does not have a PCP, is trying to get in with an office. Advised that in person appt is needed given the elevation in blood sugars Pt pleasant and very agreeable to going to ED or UC to be seen. Desires help- needs DM info and possible med adjustment.  Patient acknowledged agreement and understanding of the plan.

## 2022-06-08 NOTE — Patient Instructions (Signed)
You will not be charged for this visit.   Please go to the nearest ED or Urgent Care for eval and treatment.  Type 2 Diabetes Mellitus, Self-Care, Adult When you have type 2 diabetes (type 2 diabetes mellitus), you must make sure your blood sugar (glucose) stays in a healthy range. You can do this with: Nutrition. Exercise. Lifestyle changes. Medicines or insulin, if needed. Support from your doctors and others. What are the risks? Having type 2 diabetes can raise your risk for other long-term (chronic) health problems. You may get medicines to help prevent these problems. How to stay aware of your blood sugar  Check your blood sugar level every day, as often as told. Have your A1C (hemoglobin A1C) level checked two or more times a year. Have it checked more often if told. Your doctor will set personal treatment goals for you. In general, you should have these blood sugar levels: Before meals: 80-130 mg/dL (4.4-7.2 mmol/L). After meals: below 180 mg/dL (10 mmol/L). A1C: less than 7%. How to manage high and low blood sugar Symptoms of high blood sugar High blood sugar is also called hyperglycemia. Know the symptoms of high blood sugar. These may include: More thirst. Hunger. Feeling very tired. Needing to pee (urinate) more often than normal. Seeing things blurry. Symptoms of low blood sugar Low blood sugar is also called hypoglycemia. This is when blood sugar is at or below 70 mg/dL (3.9 mmol/L). Symptoms may include: Hunger. Feeling worried or nervous (anxious). Feeling sweaty and cold to the touch (clammy). Being dizzy or light-headed. Feeling sleepy. A fast heartbeat. Feeling grouchy (irritable). Tingling or loss of feeling (numbness) around your mouth, lips, or tongue. Restless sleep. Diabetes medicines can cause low blood sugar. You are more at risk: While you exercise. After exercise. During sleep. When you are sick. When you skip meals or do not eat for a long  time. Treating low blood sugar If you think you have low blood sugar, eat or drink something sugary right away. Keep 15 grams of a fast-acting carb (carbohydrate) with you all the time. Make sure your family and friends know how to treat you if you cannot treat yourself. Treating very low blood sugar Severe hypoglycemia is when your blood sugar is at or below 54 mg/dL (3 mmol/L). Severe hypoglycemia is an emergency. Get medical help right away. Call your local emergency services (911 in the [place] Do not wait to see if the symptoms will go away. Do not drive yourself to the hospital. You may need a glucagon shot if you have very low blood sugar and you cannot eat or drink. Have a family member or friend learn how to check your blood sugar and how to give you a glucagon shot. Ask your doctor if you should have a kit for glucagon shots. Follow these instructions at home: Medicines Take prescribed insulin or diabetes medicines as told by your health care provider. Do not run out of insulin or other medicines. Plan ahead. If you use insulin, change the amount you take based on how active you are and what foods you eat. Your doctor will tell you how to do this. Take over-the-counter and prescription medicines only as told by your doctor. Eating and drinking  Eat healthy foods. These include: Low-fat (lean) proteins. Complex carbs, such as whole grains. Fresh fruits and vegetables. Low-fat dairy products. Healthy fats. Meet with a food expert (dietitian) to make an eating plan. Follow instructions from your doctor about what you cannot  eat or drink. Drink enough fluid to keep your pee (urine) pale yellow. Keep track of carbs that you eat. Read food labels and learn serving sizes of foods. Follow your sick-day plan when you cannot eat or drink as normal. Make this plan with your doctor so it is ready to use. Activity Exercise as told by your doctor. You may need to: Do stretching and strength  exercises two or more times a week. Do 150 minutes or more of exercise each week that makes your heart beat faster and makes you sweat. Spread out your exercise over 3 or more days a week. Do not go more than 2 days in a row without exercise. Talk with your doctor before you start a new exercise. Your doctor may tell you to change: How much insulin or medicines you take. How much food you eat. Lifestyle Do not smoke or use any products that contain nicotine or tobacco. If you need help quitting, ask your doctor. If you drink alcohol and your doctor says that it is safe for you: Limit how much you have to: 0-1 drink a day for women who are not pregnant. 0-2 drinks a day for men. Know how much alcohol is in your drink. In the [place] one drink equals one 12 oz bottle of beer (355 mL), one 5 oz glass of wine (148 mL), or one 1 oz glass of hard liquor (44 mL). Learn to deal with stress. If you need help, ask your doctor. Body care  Stay up to date with your shots (immunizations). Have your eyes and feet checked by a doctor as often as told. Check your skin and feet every day. Check for cuts, bruises, redness, blisters, or sores. Brush your teeth and gums two times a day. Floss one or more times a day. Go to the dentist one or more times every 6 months. Stay at a healthy weight. General instructions Share your diabetes care plan with: Your work or school. People you live with. Carry a card or wear jewelry that says you have diabetes. Keep all follow-up visits. Questions to ask your doctor Do I need to meet with a certified expert in diabetes education and care? Where can I find a support group? Where to find more information For help and guidance and more information about diabetes, please go to: American Diabetes Association: www.diabetes.org American Association of Diabetes Care and Education Specialists: www.diabeteseducator.org International Diabetes Federation:  MemberVerification.ca Summary When you have type 2 diabetes, you must make sure your blood sugar (glucose) stays in a healthy range. You can do this with nutrition, exercise, medicines and insulin, and support from doctors and others. Check your blood sugar every day, or as often as told. Having diabetes can raise your risk for other long-term health problems. You may get medicines to help prevent these problems. Share your diabetes management plan with people at work, school, and home. Keep all follow-up visits. This information is not intended to replace advice given to you by your health care provider. Make sure you discuss any questions you have with your health care provider. Document Revised: 02/20/2021 Document Reviewed: 02/20/2021 Elsevier Patient Education  [place].

## 2022-06-08 NOTE — Telephone Encounter (Signed)
  Chief Complaint:blood sugar is going back up Symptoms: frequent urination Frequency: 3 weeks before ED visit on 06/04/22 now climbing up to 320-340 today. Pertinent Negatives: Patient denies fever, headache, dizziness, difficulty breathing Disposition: '[]'$ ED /'[]'$ Urgent Care (no appt availability in office) / '[]'$ Appointment(In office/virtual)/ '[]'$  [facility]/ '[]'$ Home Care/ '[x]'$ Refused Recommended Disposition /'[]'$ [facility]/ '[]'$  Follow-up with PCP Additional Notes: advised UC pt refused- attempted to assist pt to get signed in. This NT made account for him with information he provided (user name and password-pt is unable to get logged in MyChart account.Pt stated the app is not taking password that was given. Attempted several times. Called MyChart help desk and provided pt's phone number to call him back. Made a Virtual UC Visit apt at 1400. Pt does not have a PCP and with assist from [place], he has appt in place 06/18/22. Pt refused am appt due to his job.    Reason for Disposition  [1] Blood glucose > 300 mg/dL (16.7 mmol/L) AND [2] two or more times in a row  Answer Assessment - Initial Assessment Questions 1. BLOOD GLUCOSE: "What is your blood glucose level?"      320-340 2. ONSET: "When did you check the blood glucose?"     Every day since ED visit 3. USUAL RANGE: "What is your glucose level usually?" (e.g., usual fasting morning value, usual evening value)     *No Answer* 4. KETONES: "Do you check for ketones (urine or blood test strips)?" If yes, ask: "What does the test show now?"      N/a 5. TYPE 1 or 2:  "Do you know what type of diabetes you have?"  (e.g., Type 1, Type 2, Gestational; doesn't know)      Type 2  6. INSULIN: "Do you take insulin?" "What type of insulin(s) do you use? What is the mode of delivery? (syringe, pen; injection or pump)?"      no 7. DIABETES PILLS: "Do you take any pills for your diabetes?" If yes, ask: "Have you missed taking  any pills recently?"     Yes-no 8. OTHER SYMPTOMS: "Do you have any symptoms?" (e.g., fever, frequent urination, difficulty breathing, dizziness, weakness, vomiting)     Frequent urination  Protocols used: Diabetes - High Blood Sugar-A-AH

## 2022-06-09 ENCOUNTER — Other Ambulatory Visit: Payer: Self-pay

## 2022-06-09 ENCOUNTER — Encounter (HOSPITAL_COMMUNITY): Payer: Self-pay | Admitting: *Deleted

## 2022-06-09 ENCOUNTER — Emergency Department (HOSPITAL_COMMUNITY)
Admission: EM | Admit: 2022-06-09 | Discharge: 2022-06-10 | Disposition: A | Discharge status: Home / Self Care | Payer: 59 | Attending: Student | Admitting: Student

## 2022-06-09 DIAGNOSIS — R739 Hyperglycemia, unspecified: Secondary | ICD-10-CM

## 2022-06-09 DIAGNOSIS — R944 Abnormal results of kidney function studies: Secondary | ICD-10-CM | POA: Diagnosis not present

## 2022-06-09 DIAGNOSIS — I10 Essential (primary) hypertension: Secondary | ICD-10-CM | POA: Diagnosis not present

## 2022-06-09 DIAGNOSIS — Z7984 Long term (current) use of oral hypoglycemic drugs: Secondary | ICD-10-CM | POA: Diagnosis not present

## 2022-06-09 DIAGNOSIS — E1165 Type 2 diabetes mellitus with hyperglycemia: Secondary | ICD-10-CM | POA: Insufficient documentation

## 2022-06-09 DIAGNOSIS — Z79899 Other long term (current) drug therapy: Secondary | ICD-10-CM | POA: Insufficient documentation

## 2022-06-09 LAB — CBG MONITORING, ED: Glucose-Capillary: 400 mg/dL — ABNORMAL HIGH (ref 70–99)

## 2022-06-09 NOTE — ED Triage Notes (Signed)
Hyperglycemia all day  he checked his sugar at home  and found his sugar to be in the 400s

## 2022-06-10 LAB — URINALYSIS, ROUTINE W REFLEX MICROSCOPIC
Bacteria, UA: NONE SEEN
Bilirubin Urine: NEGATIVE
Glucose, UA: >=500 mg/dL — AB
Hgb urine dipstick: NEGATIVE
Ketones, ur: 5 mg/dL — AB
Leukocytes,Ua: NEGATIVE
Nitrite: NEGATIVE
Protein, ur: 30 mg/dL — AB
Specific Gravity, Urine: 1.023 (ref 1.005–1.030)
pH: 5 (ref 5.0–8.0)

## 2022-06-10 LAB — COMPREHENSIVE METABOLIC PANEL
ALT: 21 U/L (ref 0–44)
AST: 28 U/L (ref 15–41)
Albumin: 4.1 g/dL (ref 3.5–5.0)
Alkaline Phosphatase: 57 U/L (ref 38–126)
Anion gap: 12 (ref 5–15)
BUN: 24 mg/dL — ABNORMAL HIGH (ref 6–20)
CO2: 21 mmol/L — ABNORMAL LOW (ref 22–32)
Calcium: 9.2 mg/dL (ref 8.9–10.3)
Chloride: 99 mmol/L (ref 98–111)
Creatinine, Ser: 1.36 mg/dL — ABNORMAL HIGH (ref 0.61–1.24)
GFR, Estimated: >60 mL/min (ref >60)
Glucose, Bld: 379 mg/dL — ABNORMAL HIGH (ref 70–99)
Potassium: 4.6 mmol/L (ref 3.5–5.1)
Sodium: 132 mmol/L — ABNORMAL LOW (ref 135–145)
Total Bilirubin: 0.8 mg/dL (ref 0.3–1.2)
Total Protein: 7.1 g/dL (ref 6.5–8.1)

## 2022-06-10 LAB — CBC
HCT: 43.7 % (ref 39.0–52.0)
Hemoglobin: 15.6 g/dL (ref 13.0–17.0)
MCH: 32.6 pg (ref 26.0–34.0)
MCHC: 35.7 g/dL (ref 30.0–36.0)
MCV: 91.4 fL (ref 80.0–100.0)
Platelets: 253 10*3/uL (ref 150–400)
RBC: 4.78 MIL/uL (ref 4.22–5.81)
RDW: 11.4 % — ABNORMAL LOW (ref 11.5–15.5)
WBC: 8.4 10*3/uL (ref 4.0–10.5)
nRBC: 0 % (ref 0.0–0.2)

## 2022-06-10 LAB — LIPASE, BLOOD: Lipase: 53 U/L — ABNORMAL HIGH (ref 11–51)

## 2022-06-10 LAB — CBG MONITORING, ED
Glucose-Capillary: 319 mg/dL — ABNORMAL HIGH (ref 70–99)
Glucose-Capillary: 243 mg/dL — ABNORMAL HIGH (ref 70–99)

## 2022-06-10 MED ORDER — INSULIN ASPART 100 UNIT/ML IV SOLN
10.0000 [IU] | Freq: Once | INTRAVENOUS | Status: AC
  Administered 2022-06-10: 10 [IU] via INTRAVENOUS

## 2022-06-10 MED ORDER — LACTATED RINGERS IV BOLUS
1000.0000 mL | Freq: Once | INTRAVENOUS | Status: AC
  Administered 2022-06-10: 1000 mL via INTRAVENOUS

## 2022-06-10 NOTE — ED Provider Notes (Signed)
[facility] EMERGENCY DEPARTMENT Provider Note  CSN: [id number] Arrival date & time: 06/09/22 2316  Chief Complaint(s) Hyperglycemia  HPI Guy Horton is a 61 y.o. male with PMH HTN, HLD, T2DM who presents emergency department for evaluation of hyperglycemia.  Patient seen 6 days ago for similar complaints and sugars were improved at time of discharge but patient states that he is only on metformin and his sugars have returned to levels greater than 400.  He denies chest pain, shortness of breath, abdominal pain no nausea, vomiting or other systemic symptoms.  He has a PCP appointment set up for after the July 4 holiday.   Past Medical History Past Medical History:  Diagnosis Date   Arthritis    Diabetes ([place])    Gout    Hyperlipemia    Hypertension    Patient Active Problem List   Diagnosis Date Noted   New onset type 2 diabetes mellitus ([place]) 07/16/2017   Hematoma of right thigh 09/13/2015   Laceration of lower leg with complication 83/41/9622   Non compliance w medication regimen 11/20/2014   Gout 08/13/2012   HTN (hypertension) 08/13/2012   Erectile dysfunction 08/13/2012   Hypercholesteremia 08/13/2012   Home Medication(s) Prior to Admission medications   Medication Sig Start Date End Date Taking? Authorizing Provider  allopurinol (ZYLOPRIM) 100 MG tablet TAKE 1 TABLET BY MOUTH EVERY DAY 09/14/21   [name], MD  amLODipine (NORVASC) 5 MG tablet Take 1 tablet (5 mg total) by mouth daily. Has not been in or made appointment, may deny next request. 12/21/19   Maximiano Coss, NP  atorvastatin (LIPITOR) 40 MG tablet Take 1 tablet (40 mg total) by mouth daily. 12/21/19   Maximiano Coss, NP  Blood Glucose Monitoring Suppl (ONETOUCH VERIO) w/Device KIT  07/18/17   [provider]  Blood Glucose Monitoring Suppl (TRUE METRIX METER) DEVI 1 Device by Does not apply route 4 (four) times daily. 07/17/17   Gherghe, Vella Redhead, MD  glipiZIDE (GLUCOTROL) 5  MG tablet TAKE 1 TAB DAILY BEFORE BREAKFAST. IF BLOOD SUGAR >200 WITH METFORM TAKE 2XDAILY 10/03/20   [name], MD  glucose blood (TRUE METRIX BLOOD GLUCOSE TEST) test strip Use as instructed 12/05/17   [name], MD  HYDROcodone-acetaminophen (NORCO/VICODIN) 5-325 MG tablet  10/31/18   [provider]  metFORMIN (GLUCOPHAGE) 1000 MG tablet TAKE 1 TABLET BY MOUTH TWICE A DAY WITH MEALS 09/14/21   [name], MD  pantoprazole (PROTONIX) 20 MG tablet Take 1 tablet (20 mg total) by mouth daily. 06/04/22   Nuala Alpha A, [name]  telmisartan-hydrochlorothiazide (MICARDIS HCT) 80-25 MG tablet TAKE 1 TABLET BY MOUTH EVERY DAY 09/18/21   [name], MD  TRUEPLUS LANCETS 28G MISC 1 each by Does not apply route 4 (four) times daily. 07/17/17   [name], MD  Past Surgical History Past Surgical History:  Procedure Laterality Date   I & D EXTREMITY Left 11/14/2016   Procedure: IRRIGATION AND DEBRIDEMENT LEFT INDEX FINGER;  Surgeon: [name], MD;  Location: [facility];  Service: Orthopedics;  Laterality: Left;   Family History Family History  Problem Relation Age of Onset   Diabetes Mother    Other Father 8       Work accident   Prostate cancer Father    Colon cancer Neg Hx    Colon polyps Neg Hx    Esophageal cancer Neg Hx    Rectal cancer Neg Hx    Stomach cancer Neg Hx     Social History Social History   Tobacco Use   Smoking status: Never   Smokeless tobacco: Never  Vaping Use   Vaping Use: Never used  Substance Use Topics   Alcohol use: Yes    Comment: 3-4 beers daily   Drug use: No   Allergies Patient has no known allergies.  Review of Systems Review of Systems  All other systems reviewed and are negative.   Physical Exam Vital Signs  I have reviewed the triage vital signs BP (!) 158/87   Pulse 62    Temp 98 F (36.7 C)   Resp 17   Ht 6' (1.829 m)   Wt 100.2 kg   SpO2 96%   BMI 29.96 kg/m   Physical Exam Constitutional:      General: He is not in acute distress.    Appearance: Normal appearance.  HENT:     Head: Normocephalic and atraumatic.     Nose: No congestion or rhinorrhea.  Eyes:     General:        Right eye: No discharge.        Left eye: No discharge.     Extraocular Movements: Extraocular movements intact.     Pupils: Pupils are equal, round, and reactive to light.  Cardiovascular:     Rate and Rhythm: Normal rate and regular rhythm.     Heart sounds: No murmur heard. Pulmonary:     Effort: No respiratory distress.     Breath sounds: No wheezing or rales.  Abdominal:     General: There is no distension.     Tenderness: There is no abdominal tenderness.  Musculoskeletal:        General: Normal range of motion.     Cervical back: Normal range of motion.  Skin:    General: Skin is warm and dry.  Neurological:     General: No focal deficit present.     Mental Status: He is alert.     ED Results and Treatments Labs (all labs ordered are listed, but only abnormal results are displayed) Labs Reviewed  LIPASE, BLOOD - Abnormal; Notable for the following components:      Result Value   Lipase 53 (*)    All other components within normal limits  COMPREHENSIVE METABOLIC PANEL - Abnormal; Notable for the following components:   Sodium 132 (*)    CO2 21 (*)    Glucose, Bld 379 (*)    BUN 24 (*)    Creatinine, Ser 1.36 (*)    All other components within normal limits  CBC - Abnormal; Notable for the following components:   RDW 11.4 (*)    All other components within normal limits  URINALYSIS, ROUTINE W REFLEX MICROSCOPIC - Abnormal; Notable for the following components:   Color, Urine STRAW (*)  Glucose, UA >=500 (*)    Ketones, ur 5 (*)    Protein, ur 30 (*)    All other components within normal limits  CBG MONITORING, ED - Abnormal; Notable for  the following components:   Glucose-Capillary 400 (*)    All other components within normal limits  CBG MONITORING, ED - Abnormal; Notable for the following components:   Glucose-Capillary 319 (*)    All other components within normal limits  CBG MONITORING, ED - Abnormal; Notable for the following components:   Glucose-Capillary 243 (*)    All other components within normal limits                                                                                                                          Radiology No results found.  Pertinent labs & imaging results that were available during my care of the patient were reviewed by me and considered in my medical decision making (see MDM for details).  Medications Ordered in ED Medications  lactated ringers bolus 1,000 mL (0 mLs Intravenous Stopped 06/10/22 0417)  insulin aspart (novoLOG) injection 10 Units (10 Units Intravenous Given 06/10/22 0428)                                                                                                                                     Procedures Procedures  (including critical care time)  Medical Decision Making / ED Course   This patient presents to the ED for concern of hypoglycemia, this involves an extensive number of treatment options, and is a complaint that carries with it a high risk of complications and morbidity.  The differential diagnosis includes Failure of outpatient glycemic regimen, stress hyperglycemia, DKA, HHS  MDM: Patient seen emergency room for evaluation of hyperglycemia.  Physical exam unremarkable.  Laboratory evaluation with an initial blood sugar of 400 with CMP blood sugar 379, BUN 24, creatinine 1.36 with a normal gap and bicarb of 21.  Urinalysis with very minimal ketones.  Patient given 1 L of lactated Ringer's and on reevaluation sugars have improved to 319.  Patient then given 10 units of insulin and recheck shows a blood glucose of 243.  Patient is currently not in  DKA or HHS and is asymptomatic hyperglycemia can be addressed outside the hospital.  Patient does not meet inpatient criteria for admission is safe for discharge with outpatient follow-up.  Additional history obtained:  -External records from outside source obtained and reviewed including: Chart review including previous notes, labs, imaging, consultation notes   Lab Tests: -I ordered, reviewed, and interpreted labs.   The pertinent results include:   Labs Reviewed  LIPASE, BLOOD - Abnormal; Notable for the following components:      Result Value   Lipase 53 (*)    All other components within normal limits  COMPREHENSIVE METABOLIC PANEL - Abnormal; Notable for the following components:   Sodium 132 (*)    CO2 21 (*)    Glucose, Bld 379 (*)    BUN 24 (*)    Creatinine, Ser 1.36 (*)    All other components within normal limits  CBC - Abnormal; Notable for the following components:   RDW 11.4 (*)    All other components within normal limits  URINALYSIS, ROUTINE W REFLEX MICROSCOPIC - Abnormal; Notable for the following components:   Color, Urine STRAW (*)    Glucose, UA >=500 (*)    Ketones, ur 5 (*)    Protein, ur 30 (*)    All other components within normal limits  CBG MONITORING, ED - Abnormal; Notable for the following components:   Glucose-Capillary 400 (*)    All other components within normal limits  CBG MONITORING, ED - Abnormal; Notable for the following components:   Glucose-Capillary 319 (*)    All other components within normal limits  CBG MONITORING, ED - Abnormal; Notable for the following components:   Glucose-Capillary 243 (*)    All other components within normal limits      Medicines ordered and prescription drug management: Meds ordered this encounter  Medications   lactated ringers bolus 1,000 mL   insulin aspart (novoLOG) injection 10 Units    -I have reviewed the patients home medicines and have made adjustments as needed  Critical  interventions none    Cardiac Monitoring: The patient was maintained on a cardiac monitor.  I personally viewed and interpreted the cardiac monitored which showed an underlying rhythm of: NSR  Social Determinants of Health:  Factors impacting patients care include: none   Reevaluation: After the interventions noted above, I reevaluated the patient and found that they have :improved  Co morbidities that complicate the patient evaluation  Past Medical History:  Diagnosis Date   Arthritis    Diabetes ([place])    Gout    Hyperlipemia    Hypertension       Dispostion: I considered admission for this patient, but he does not meet inpatient criteria for admission is safe for discharge with outpatient follow-up     Final Clinical Impression(s) / ED Diagnoses Final diagnoses:  Hyperglycemia     _0 @    Teressa Lower, MD 06/10/22 [phone]

## 2022-06-14 ENCOUNTER — Ambulatory Visit: Payer: 59 | Admitting: Cardiology

## 2022-06-18 ENCOUNTER — Ambulatory Visit: Payer: 59 | Admitting: Family Medicine

## 2022-06-18 ENCOUNTER — Telehealth: Payer: Self-pay | Admitting: Family Medicine

## 2022-06-18 NOTE — Telephone Encounter (Signed)
Pt was a no show on 06/18/2022 for a NP visit with Dr. Grandville Silos, I have sent out a no show letter

## 2022-06-21 ENCOUNTER — Inpatient Hospital Stay (INDEPENDENT_AMBULATORY_CARE_PROVIDER_SITE_OTHER): Payer: 59 | Admitting: Primary Care

## 2022-07-09 NOTE — Telephone Encounter (Signed)
1st no show, fee waived, letter sent 

## 2022-07-16 ENCOUNTER — Ambulatory Visit (HOSPITAL_COMMUNITY)
Admission: EM | Admit: 2022-07-16 | Discharge: 2022-07-16 | Disposition: A | Discharge status: Home / Self Care | Payer: 59 | Attending: Physician Assistant | Admitting: Physician Assistant

## 2022-07-16 ENCOUNTER — Encounter (HOSPITAL_COMMUNITY): Payer: Self-pay | Admitting: Physician Assistant

## 2022-07-16 DIAGNOSIS — L03011 Cellulitis of right finger: Secondary | ICD-10-CM

## 2022-07-16 MED ORDER — CLINDAMYCIN HCL 300 MG PO CAPS: 300.0000 mg | ORAL_CAPSULE | Freq: Three times a day (TID) | ORAL | 0 refills | Status: AC

## 2022-07-16 NOTE — ED Provider Notes (Signed)
[facility]    CSN: [id number] Arrival date & time: 07/16/22  1514      History   Chief Complaint Chief Complaint  Patient presents with   Hand Pain    HPI Guy Horton is a 61 y.o. male.   Patient here concerned with R thumb pain x several days.  Admits swelling, pain, tenderness, erythema.  No discharge.  No arthralgia.  No advil or tylenol today.    Past Medical History:  Diagnosis Date   Arthritis    Diabetes ([place])    Gout    Hyperlipemia    Hypertension     Patient Active Problem List   Diagnosis Date Noted   New onset type 2 diabetes mellitus ([place]) 07/16/2017   Hematoma of right thigh 09/13/2015   Laceration of lower leg with complication 18/29/9371   Non compliance w medication regimen 11/20/2014   Gout 08/13/2012   HTN (hypertension) 08/13/2012   Erectile dysfunction 08/13/2012   Hypercholesteremia 08/13/2012    Past Surgical History:  Procedure Laterality Date   I & D EXTREMITY Left 11/14/2016   Procedure: IRRIGATION AND DEBRIDEMENT LEFT INDEX FINGER;  Surgeon: [name], MD;  Location: [facility];  Service: Orthopedics;  Laterality: Left;       Home Medications    Prior to Admission medications   Medication Sig Start Date End Date Taking? Authorizing Provider  clindamycin (CLEOCIN) 300 MG capsule Take 1 capsule (300 mg total) by mouth 3 (three) times daily for 7 days. 07/16/22 07/23/22 Yes Peri Jefferson, [name]  allopurinol (ZYLOPRIM) 100 MG tablet TAKE 1 TABLET BY MOUTH EVERY DAY 09/14/21   [name], MD  amLODipine (NORVASC) 5 MG tablet Take 1 tablet (5 mg total) by mouth daily. Has not been in or made appointment, may deny next request. 12/21/19   Maximiano Coss, NP  atorvastatin (LIPITOR) 40 MG tablet Take 1 tablet (40 mg total) by mouth daily. 12/21/19   Maximiano Coss, NP  Blood Glucose Monitoring Suppl (ONETOUCH VERIO) w/Device KIT  07/18/17   [provider]  Blood Glucose Monitoring Suppl (TRUE METRIX METER) DEVI 1  Device by Does not apply route 4 (four) times daily. 07/17/17   Gherghe, Vella Redhead, MD  glipiZIDE (GLUCOTROL) 5 MG tablet TAKE 1 TAB DAILY BEFORE BREAKFAST. IF BLOOD SUGAR >200 WITH METFORM TAKE 2XDAILY 10/03/20   [name], MD  glucose blood (TRUE METRIX BLOOD GLUCOSE TEST) test strip Use as instructed 12/05/17   [name], MD  HYDROcodone-acetaminophen (NORCO/VICODIN) 5-325 MG tablet  10/31/18   [provider]  metFORMIN (GLUCOPHAGE) 1000 MG tablet TAKE 1 TABLET BY MOUTH TWICE A DAY WITH MEALS 09/14/21   [name], MD  pantoprazole (PROTONIX) 20 MG tablet Take 1 tablet (20 mg total) by mouth daily. 06/04/22   Nuala Alpha A, [name]  telmisartan-hydrochlorothiazide (MICARDIS HCT) 80-25 MG tablet TAKE 1 TABLET BY MOUTH EVERY DAY 09/18/21   [name], MD  TRUEPLUS LANCETS 28G MISC 1 each by Does not apply route 4 (four) times daily. 07/17/17   [name], MD    Family History Family History  Problem Relation Age of Onset   Diabetes Mother    Other Father 28       Work accident   Prostate cancer Father    Colon cancer Neg Hx    Colon polyps Neg Hx    Esophageal cancer Neg Hx    Rectal cancer Neg Hx    Stomach  cancer Neg Hx     Social History Social History   Tobacco Use   Smoking status: Never   Smokeless tobacco: Never  Vaping Use   Vaping Use: Never used  Substance Use Topics   Alcohol use: Yes    Comment: 3-4 beers daily   Drug use: No     Allergies   Patient has no known allergies.   Review of Systems Review of Systems  Constitutional:  Negative for chills, fatigue and fever.  Musculoskeletal:  Positive for myalgias. Negative for arthralgias, joint swelling, neck pain and neck stiffness.  Skin:  Positive for color change. Negative for rash.  Psychiatric/Behavioral:  Negative for sleep disturbance.      Physical Exam Triage Vital Signs ED Triage Vitals  Enc Vitals Group     BP 07/16/22 1550 (!) 165/94     Pulse Rate  07/16/22 1549 82     Resp 07/16/22 1549 18     Temp 07/16/22 1549 97.9 F (36.6 C)     Temp Source 07/16/22 1549 Oral     SpO2 07/16/22 1549 98 %     Weight --      Height --      Head Circumference --      Peak Flow --      Pain Score 07/16/22 1623 2     Pain Loc --      Pain Edu? --      Excl. in [place]? --    No data found.  Updated Vital Signs BP (!) 165/94   Pulse 82   Temp 97.9 F (36.6 C) (Oral)   Resp 18   SpO2 99%   Visual Acuity Right Eye Distance:   Left Eye Distance:   Bilateral Distance:    Right Eye Near:   Left Eye Near:    Bilateral Near:     Physical Exam Vitals and nursing note reviewed.  Constitutional:      General: He is not in acute distress.    Appearance: Normal appearance. He is not ill-appearing.  HENT:     Head: Normocephalic and atraumatic.  Eyes:     General: No scleral icterus.    Extraocular Movements: Extraocular movements intact.     Conjunctiva/sclera: Conjunctivae normal.  Pulmonary:     Effort: Pulmonary effort is normal. No respiratory distress.  Musculoskeletal:     Right hand: Swelling and tenderness present. No deformity, lacerations or bony tenderness. Normal range of motion. Normal strength. Normal sensation. There is no disruption of two-point discrimination. Normal capillary refill. Normal pulse.     Cervical back: Normal range of motion. No rigidity.     Comments: Fluctuance mass radial aspect R thumbnail  Skin:    Capillary Refill: Capillary refill takes less than 2 seconds.     Coloration: Skin is not jaundiced.     Findings: No rash.  Neurological:     General: No focal deficit present.     Mental Status: He is alert and oriented to person, place, and time.     Motor: No weakness.     Gait: Gait normal.  Psychiatric:        Mood and Affect: Mood normal.        Behavior: Behavior normal.      UC Treatments / Results  Labs (all labs ordered are listed, but only abnormal results are displayed) Labs Reviewed  - No data to display  EKG   Radiology No results found.  Procedures Incision and  Drainage  Date/Time: 07/16/2022 4:29 PM  Performed by: Peri Jefferson, [name] Authorized by: Peri Jefferson, [name]   Consent:    Consent obtained:  Verbal   Consent given by:  Patient   Risks discussed:  Pain and incomplete drainage Universal protocol:    Patient identity confirmed:  Verbally with patient Location:    Type:  Abscess   Location:  Upper extremity   Upper extremity location:  Finger   Finger location:  R thumb Pre-procedure details:    Skin preparation:  Chlorhexidine with alcohol Sedation:    Sedation type:  None Anesthesia:    Anesthesia method:  None Procedure type:    Complexity:  Simple Procedure details:    Needle aspiration: yes     Needle size:  18 G   Incision types:  Stab incision   Incision depth:  Dermal   Drainage:  Purulent   Wound treatment:  Wound left open   Packing materials:  None Post-procedure details:    Procedure completion:  Tolerated well, no immediate complications  (including critical care time)  Medications Ordered in UC Medications - No data to display  Initial Impression / Assessment and Plan / UC Course  I have reviewed the triage vital signs and the nursing notes.  Pertinent labs & imaging results that were available during my care of the patient were reviewed by me and considered in my medical decision making (see chart for details).     Wash with soapy water Take medication as prescribed  Final Clinical Impressions(s) / UC Diagnoses   Final diagnoses:  Paronychia of right thumb     Discharge Instructions      Clean with warm, soapy water Take ibuprofen or tylenol as needed for pain    ED Prescriptions     Medication Sig Dispense Auth. Provider   clindamycin (CLEOCIN) 300 MG capsule Take 1 capsule (300 mg total) by mouth 3 (three) times daily for 7 days. 21 capsule Peri Jefferson, [name]      PDMP not reviewed this  encounter.   Peri Jefferson, [name] 07/16/22 1634

## 2022-07-16 NOTE — ED Triage Notes (Signed)
Pt reports right thumb pain for several days.

## 2022-07-16 NOTE — Discharge Instructions (Addendum)
Clean with warm, soapy water Take ibuprofen or tylenol as needed for pain

## 2022-07-26 ENCOUNTER — Encounter (HOSPITAL_COMMUNITY): Payer: Self-pay | Admitting: Emergency Medicine

## 2022-07-26 ENCOUNTER — Ambulatory Visit (INDEPENDENT_AMBULATORY_CARE_PROVIDER_SITE_OTHER): Payer: No Typology Code available for payment source

## 2022-07-26 ENCOUNTER — Ambulatory Visit (HOSPITAL_COMMUNITY)
Admission: EM | Admit: 2022-07-26 | Discharge: 2022-07-26 | Disposition: A | Discharge status: Home / Self Care | Payer: No Typology Code available for payment source

## 2022-07-26 DIAGNOSIS — M79644 Pain in right finger(s): Secondary | ICD-10-CM

## 2022-07-26 DIAGNOSIS — L03011 Cellulitis of right finger: Secondary | ICD-10-CM

## 2022-07-26 MED ORDER — CEFTRIAXONE SODIUM 1 G IJ SOLR
1.0000 g | Freq: Once | INTRAMUSCULAR | Status: AC
  Administered 2022-07-26: 1 g via INTRAMUSCULAR

## 2022-07-26 MED ORDER — LIDOCAINE HCL (PF) 1 % IJ SOLN
INTRAMUSCULAR | Status: AC
  Filled 2022-07-26: qty 2

## 2022-07-26 MED ORDER — CEFTRIAXONE SODIUM 1 G IJ SOLR
INTRAMUSCULAR | Status: AC
  Filled 2022-07-26: qty 10

## 2022-07-26 MED ORDER — MUPIROCIN 2 % EX OINT: 1.0000 | TOPICAL_OINTMENT | Freq: Two times a day (BID) | CUTANEOUS | 0 refills | Status: DC

## 2022-07-26 MED ORDER — AMOXICILLIN-POT CLAVULANATE 875-125 MG PO TABS: 1.0000 | ORAL_TABLET | Freq: Two times a day (BID) | ORAL | 0 refills | Status: DC

## 2022-07-26 NOTE — ED Triage Notes (Addendum)
Patient c/o RT thumb abscess x 1.5 weeks.   Patient endorses increased pain and swelling to site.   Patient denies any recent drainage from site.   Patient endorses coming to this clinic last week and being placed on antibiotic  (clindamycin) with no resolution of symptoms.

## 2022-07-26 NOTE — Discharge Instructions (Signed)
We drained this area again today.  It is reported that you do warm salt water soaks a few times per day and apply Bactroban ointment with dressing changes.  Since this has been ongoing I do recommend that you follow-up with hand specialist.  Please call them to schedule an appointment.  We gave you an injection of antibiotics and I would like you to stop the clindamycin and start Augmentin at home.  Use Tylenol and ibuprofen for pain.  If you have any worsening symptoms including recurrent swelling, increased pain, fever, nausea, vomiting, weakness you need to go to the emergency room immediately as we discussed.

## 2022-07-26 NOTE — ED Provider Notes (Signed)
[place]    CSN: [id number] Arrival date & time: 07/26/22  2122      History   Chief Complaint Chief Complaint  Patient presents with   Abscess    HPI Guy Horton is a 61 y.o. male.   Patient presents today with a several week history of swelling and pain along the nailbed of his right thumb.  He does report this began as a hangnail which then led to swelling and discomfort of the nailbed.  He was seen by our clinic 07/16/2022 at which point area was drained and he was started on clindamycin.  He did not initially pick up the medication and was seen 07/19/2022 at a different urgent care.  They recommended that he take antibiotics as prescribed.  He has been taking them and reports associated diarrhea and abdominal upset.  Despite medication he continues to have worsening swelling and accumulation of purulent drainage.  He is right-handed.  He has a history of type 2 diabetes but reports blood sugars adequately controlled.  Pain is rated 8 on a 0-10 pain scale, localized to nail bed, described as throbbing, worse with palpation, no alleviating factors identified.  Has been using over-the-counter medications for additional symptom relief.  Denies any fever, nausea, vomiting.  Denies additional antibiotic use outside of clindamycin prescribed at last visit.    Past Medical History:  Diagnosis Date   Arthritis    Diabetes ([place])    Gout    Hyperlipemia    Hypertension     Patient Active Problem List   Diagnosis Date Noted   New onset type 2 diabetes mellitus ([place]) 07/16/2017   Hematoma of right thigh 09/13/2015   Laceration of lower leg with complication 48/25/0037   Non compliance w medication regimen 11/20/2014   Gout 08/13/2012   HTN (hypertension) 08/13/2012   Erectile dysfunction 08/13/2012   Hypercholesteremia 08/13/2012    Past Surgical History:  Procedure Laterality Date   I & D EXTREMITY Left 11/14/2016   Procedure: IRRIGATION AND DEBRIDEMENT LEFT INDEX  FINGER;  Surgeon: [name], MD;  Location: [facility];  Service: Orthopedics;  Laterality: Left;       Home Medications    Prior to Admission medications   Medication Sig Start Date End Date Taking? Authorizing Provider  allopurinol (ZYLOPRIM) 100 MG tablet TAKE 1 TABLET BY MOUTH EVERY DAY 09/14/21  Yes [name], MD  amLODipine (NORVASC) 5 MG tablet Take 1 tablet (5 mg total) by mouth daily. Has not been in or made appointment, may deny next request. 12/21/19  Yes Maximiano Coss, NP  amoxicillin-clavulanate (AUGMENTIN) 875-125 MG tablet Take 1 tablet by mouth every 12 (twelve) hours. 07/26/22  Yes Kareem Cathey K, [name]  glipiZIDE (GLUCOTROL) 5 MG tablet TAKE 1 TAB DAILY BEFORE BREAKFAST. IF BLOOD SUGAR >200 WITH METFORM TAKE 2XDAILY 10/03/20  Yes [name], MD  JARDIANCE 10 MG TABS tablet Take 10 mg by mouth daily. 07/19/22  Yes [provider]  mupirocin ointment (BACTROBAN) 2 % Apply 1 Application topically 2 (two) times daily. 07/26/22  Yes Kseniya Grunden K, [name]  pantoprazole (PROTONIX) 20 MG tablet Take 1 tablet (20 mg total) by mouth daily. 06/04/22  Yes Nuala Alpha A, [name]  telmisartan-hydrochlorothiazide (MICARDIS HCT) 80-25 MG tablet TAKE 1 TABLET BY MOUTH EVERY DAY 09/18/21  Yes [name], MD  Blood Glucose Monitoring Suppl (ONETOUCH VERIO) w/Device KIT  07/18/17   [provider]  Blood Glucose Monitoring Suppl (TRUE METRIX  METER) DEVI 1 Device by Does not apply route 4 (four) times daily. 07/17/17   [name], MD  glucose blood (TRUE METRIX BLOOD GLUCOSE TEST) test strip Use as instructed 12/05/17   [name], MD  TRUEPLUS LANCETS 28G MISC 1 each by Does not apply route 4 (four) times daily. 07/17/17   [name], MD    Family History Family History  Problem Relation Age of Onset   Diabetes Mother    Other Father 90       Work accident   Prostate cancer Father    Colon cancer Neg Hx    Colon polyps Neg Hx     Esophageal cancer Neg Hx    Rectal cancer Neg Hx    Stomach cancer Neg Hx     Social History Social History   Tobacco Use   Smoking status: Never   Smokeless tobacco: Never  Vaping Use   Vaping Use: Never used  Substance Use Topics   Alcohol use: Yes    Comment: 3-4 beers daily   Drug use: No     Allergies   Patient has no known allergies.   Review of Systems Review of Systems  Constitutional:  Positive for activity change. Negative for appetite change, fatigue and fever.  Gastrointestinal:  Negative for abdominal pain, diarrhea, nausea and vomiting.  Musculoskeletal:  Positive for arthralgias and joint swelling. Negative for myalgias.  Skin:  Positive for color change. Negative for wound.  Neurological:  Negative for weakness and numbness.     Physical Exam Triage Vital Signs ED Triage Vitals  Enc Vitals Group     BP 07/26/22 0835 (!) 145/85     Pulse Rate 07/26/22 0835 73     Resp 07/26/22 0835 12     Temp 07/26/22 0835 98 F (36.7 C)     Temp Source 07/26/22 0835 Oral     SpO2 07/26/22 0835 97 %     Weight --      Height --      Head Circumference --      Peak Flow --      Pain Score 07/26/22 0841 8     Pain Loc --      Pain Edu? --      Excl. in [place]? --    No data found.  Updated Vital Signs BP (!) 145/85 (BP Location: Left Arm)   Pulse 73   Temp 98 F (36.7 C) (Oral)   Resp 12   SpO2 97%   Visual Acuity Right Eye Distance:   Left Eye Distance:   Bilateral Distance:    Right Eye Near:   Left Eye Near:    Bilateral Near:     Physical Exam Vitals reviewed.  Constitutional:      General: He is awake.     Appearance: Normal appearance. He is well-developed. He is not ill-appearing.     Comments: Very pleasant male appears stated age in no acute distress  HENT:     Head: Normocephalic and atraumatic.     Nose: Nose normal.     Mouth/Throat:     Pharynx: Uvula midline. No oropharyngeal exudate or posterior oropharyngeal erythema.   Cardiovascular:     Rate and Rhythm: Normal rate and regular rhythm.     Heart sounds: Normal heart sounds, S1 normal and S2 normal. No murmur heard. Pulmonary:     Effort: Pulmonary effort is normal.     Breath sounds: Normal breath sounds.  No stridor. No wheezing, rhonchi or rales.  Musculoskeletal:     Right hand: Swelling present. Decreased range of motion. Normal strength. Normal sensation. There is disruption of two-point discrimination. Decreased capillary refill.     Comments: Purulence noted medial and proximal nail bed with surrounding erythema and tenderness with patient.  Hand neurovascularly intact.  Neurological:     Mental Status: He is alert.  Psychiatric:        Behavior: Behavior is cooperative.      UC Treatments / Results  Labs (all labs ordered are listed, but only abnormal results are displayed) Labs Reviewed - No data to display  EKG   Radiology DG Finger Thumb Right  Result Date: 07/26/2022 CLINICAL DATA:  Infection EXAM: RIGHT THUMB 2+V COMPARISON:  None Available. FINDINGS: There is soft tissue swelling of the thumb. There is no evidence of acute fracture. There is no bony erosion or frank bony destruction. No periostitis. Mild osteoarthritis of the thumb CMC, MCP, and IP joints. IMPRESSION: Soft tissue swelling of the thumb.  No acute osseous abnormality. Electronically Signed   By: Maurine Simmering M.D.   On: 07/26/2022 09:18    Procedures Incision and Drainage  Date/Time: 07/26/2022 9:37 AM  Performed by: Terrilee Croak, [name] Authorized by: Terrilee Croak, [name]   Consent:    Consent obtained:  Verbal   Risks discussed:  Incomplete drainage and bleeding   Alternatives discussed:  Observation and referral Universal protocol:    Procedure explained and questions answered to patient or proxy's satisfaction: yes     Patient identity confirmed:  Verbally with patient Location:    Type:  Abscess   Location:  Upper extremity   Upper extremity location:   Finger   Finger location:  R thumb Pre-procedure details:    Skin preparation:  Chlorhexidine with alcohol Sedation:    Sedation type:  None Anesthesia:    Anesthesia method:  None Procedure type:    Complexity:  Simple Procedure details:    Ultrasound guidance: no     Needle aspiration: no     Incision types:  Stab incision   Drainage:  Purulent and bloody   Drainage amount:  Moderate   Wound treatment:  Wound left open Post-procedure details:    Procedure completion:  Tolerated well, no immediate complications  (including critical care time)  Medications Ordered in UC Medications  cefTRIAXone (ROCEPHIN) injection 1 g (1 g Intramuscular Given 07/26/22 0941)    Initial Impression / Assessment and Plan / UC Course  I have reviewed the triage vital signs and the nursing notes.  Pertinent labs & imaging results that were available during my care of the patient were reviewed by me and considered in my medical decision making (see chart for details).     X-ray obtained which showed no lytic lesions or concerns for osteomyelitis.  Paronychia was drained with topical pain ease spray for anesthesia and 18-gauge needle.  Significant drainage was expressed.  Discussed that he will need to keep area clean and use warm water soaks to encourage ongoing drainage.  He is to keep the area dressed and use Bactroban ointment twice daily.  He will stop clindamycin and start Augmentin.  Discussed that he should follow-up closely with hand specialist if his symptoms are not proving quickly.  He was given contact information for local provider with instruction to call to schedule an appointment.  Recommended over-the-counter medications including Tylenol and ibuprofen.  Discussed that if he has recurrent swelling/pain,  numbness, fever, nausea, vomiting he needs to go to the emergency room immediately.  Strict return precautions given to which she expressed understanding.  Final Clinical Impressions(s) /  UC Diagnoses   Final diagnoses:  Paronychia of right thumb     Discharge Instructions      We drained this area again today.  It is reported that you do warm salt water soaks a few times per day and apply Bactroban ointment with dressing changes.  Since this has been ongoing I do recommend that you follow-up with hand specialist.  Please call them to schedule an appointment.  We gave you an injection of antibiotics and I would like you to stop the clindamycin and start Augmentin at home.  Use Tylenol and ibuprofen for pain.  If you have any worsening symptoms including recurrent swelling, increased pain, fever, nausea, vomiting, weakness you need to go to the emergency room immediately as we discussed.     ED Prescriptions     Medication Sig Dispense Auth. Provider   mupirocin ointment (BACTROBAN) 2 % Apply 1 Application topically 2 (two) times daily. 22 g Elzina Devera K, [name]   amoxicillin-clavulanate (AUGMENTIN) 875-125 MG tablet Take 1 tablet by mouth every 12 (twelve) hours. 14 tablet Sole Lengacher, Derry Skill, [name]      PDMP not reviewed this encounter.   Terrilee Croak, [name] 07/26/22 4818

## 2022-08-02 DIAGNOSIS — R030 Elevated blood-pressure reading, without diagnosis of hypertension: Secondary | ICD-10-CM | POA: Diagnosis not present

## 2022-08-02 DIAGNOSIS — Z6829 Body mass index (BMI) 29.0-29.9, adult: Secondary | ICD-10-CM | POA: Diagnosis not present

## 2022-08-02 DIAGNOSIS — N400 Enlarged prostate without lower urinary tract symptoms: Secondary | ICD-10-CM | POA: Diagnosis not present

## 2022-08-02 DIAGNOSIS — Z0130 Encounter for examination of blood pressure without abnormal findings: Secondary | ICD-10-CM | POA: Diagnosis not present

## 2022-08-02 DIAGNOSIS — E119 Type 2 diabetes mellitus without complications: Secondary | ICD-10-CM | POA: Diagnosis not present

## 2022-08-02 DIAGNOSIS — N529 Male erectile dysfunction, unspecified: Secondary | ICD-10-CM | POA: Diagnosis not present

## 2022-08-02 DIAGNOSIS — I10 Essential (primary) hypertension: Secondary | ICD-10-CM | POA: Diagnosis not present

## 2022-08-19 ENCOUNTER — Emergency Department (HOSPITAL_COMMUNITY): Payer: PRIVATE HEALTH INSURANCE

## 2022-08-19 ENCOUNTER — Other Ambulatory Visit: Payer: Self-pay

## 2022-08-19 ENCOUNTER — Encounter (HOSPITAL_COMMUNITY): Payer: Self-pay

## 2022-08-19 ENCOUNTER — Emergency Department (HOSPITAL_COMMUNITY)
Admission: EM | Admit: 2022-08-19 | Discharge: 2022-08-20 | Disposition: A | Discharge status: Home / Self Care | Payer: PRIVATE HEALTH INSURANCE | Attending: Emergency Medicine | Admitting: Emergency Medicine

## 2022-08-19 DIAGNOSIS — I10 Essential (primary) hypertension: Secondary | ICD-10-CM | POA: Diagnosis not present

## 2022-08-19 DIAGNOSIS — Z7984 Long term (current) use of oral hypoglycemic drugs: Secondary | ICD-10-CM | POA: Insufficient documentation

## 2022-08-19 DIAGNOSIS — E119 Type 2 diabetes mellitus without complications: Secondary | ICD-10-CM | POA: Diagnosis not present

## 2022-08-19 DIAGNOSIS — S300XXA Contusion of lower back and pelvis, initial encounter: Secondary | ICD-10-CM

## 2022-08-19 DIAGNOSIS — M533 Sacrococcygeal disorders, not elsewhere classified: Secondary | ICD-10-CM | POA: Insufficient documentation

## 2022-08-19 DIAGNOSIS — Y900 Blood alcohol level of less than 20 mg/100 ml: Secondary | ICD-10-CM | POA: Diagnosis not present

## 2022-08-19 DIAGNOSIS — M545 Low back pain, unspecified: Secondary | ICD-10-CM | POA: Diagnosis present

## 2022-08-19 DIAGNOSIS — Z79899 Other long term (current) drug therapy: Secondary | ICD-10-CM | POA: Insufficient documentation

## 2022-08-19 NOTE — ED Triage Notes (Signed)
Reports lower back pain since Saturday morning after falling off truck wheel.  Denies numnbness or tingling to legs

## 2022-08-20 ENCOUNTER — Emergency Department (HOSPITAL_COMMUNITY): Payer: 59

## 2022-08-20 MED ORDER — HYDROCODONE-ACETAMINOPHEN 5-325 MG PO TABS: 1.0000 | ORAL_TABLET | Freq: Four times a day (QID) | ORAL | 0 refills | Status: DC | PRN

## 2022-08-20 MED ORDER — KETOROLAC TROMETHAMINE 60 MG/2ML IM SOLN
60.0000 mg | Freq: Once | INTRAMUSCULAR | Status: AC
  Administered 2022-08-20: 60 mg via INTRAMUSCULAR
  Filled 2022-08-20: qty 2

## 2022-08-20 NOTE — Discharge Instructions (Addendum)
Begin taking hydrocodone as prescribed as needed for pain.  Rest.  Follow-up with primary doctor if not improving in the next week.

## 2022-08-20 NOTE — ED Provider Notes (Signed)
[facility] EMERGENCY DEPARTMENT Provider Note   CSN: [id number] Arrival date & time: 08/19/22  1652     History  Chief Complaint  Patient presents with   Back Pain    Guy Horton is a 61 y.o. male.  Patient is a 61 year old male with past medical history of diabetes and hypertension.  Patient presenting today for evaluation of a fall.  He states he was working on a tire of a truck when he fell backward approximately 2 feet and landed on the concrete with his low back striking first.  He has been experiencing significant discomfort in the lower lumbar/sacral region.  He is able to ambulate, however with significant discomfort.  He denies any radiation of the pain into his legs.  He denies any bowel or bladder complaints.  There are no alleviating factors.  The history is provided by the patient.       Home Medications Prior to Admission medications   Medication Sig Start Date End Date Taking? Authorizing Provider  allopurinol (ZYLOPRIM) 100 MG tablet TAKE 1 TABLET BY MOUTH EVERY DAY 09/14/21   [name], MD  amLODipine (NORVASC) 5 MG tablet Take 1 tablet (5 mg total) by mouth daily. Has not been in or made appointment, may deny next request. 12/21/19   Maximiano Coss, NP  amoxicillin-clavulanate (AUGMENTIN) 875-125 MG tablet Take 1 tablet by mouth every 12 (twelve) hours. 07/26/22   [name], Derry Skill, [name]  Blood Glucose Monitoring Suppl (ONETOUCH VERIO) w/Device KIT  07/18/17   [provider]  Blood Glucose Monitoring Suppl (TRUE METRIX METER) DEVI 1 Device by Does not apply route 4 (four) times daily. 07/17/17   Gherghe, Vella Redhead, MD  glipiZIDE (GLUCOTROL) 5 MG tablet TAKE 1 TAB DAILY BEFORE BREAKFAST. IF BLOOD SUGAR >200 WITH METFORM TAKE 2XDAILY 10/03/20   [name], MD  glucose blood (TRUE METRIX BLOOD GLUCOSE TEST) test strip Use as instructed 12/05/17   [name], MD  JARDIANCE 10 MG TABS tablet Take 10 mg by mouth daily. 07/19/22    [provider]  mupirocin ointment (BACTROBAN) 2 % Apply 1 Application topically 2 (two) times daily. 07/26/22   [name], Derry Skill, [name]  pantoprazole (PROTONIX) 20 MG tablet Take 1 tablet (20 mg total) by mouth daily. 06/04/22   Nuala Alpha A, [name]  telmisartan-hydrochlorothiazide (MICARDIS HCT) 80-25 MG tablet TAKE 1 TABLET BY MOUTH EVERY DAY 09/18/21   [name], MD  TRUEPLUS LANCETS 28G MISC 1 each by Does not apply route 4 (four) times daily. 07/17/17   [name], MD      Allergies    Patient has no known allergies.    Review of Systems   Review of Systems  All other systems reviewed and are negative.   Physical Exam Updated Vital Signs BP (!) 134/90   Pulse 70   Temp (!) 97.5 F (36.4 C) (Oral)   Resp 10   Ht 6' (1.829 m)   Wt 103 kg   SpO2 98%   BMI 30.79 kg/m  Physical Exam Vitals and nursing note reviewed.  Constitutional:      General: He is not in acute distress.    Appearance: He is well-developed. He is not diaphoretic.  HENT:     Head: Normocephalic and atraumatic.  Cardiovascular:     Rate and Rhythm: Normal rate and regular rhythm.     Heart sounds: No murmur heard.    No friction rub.  Pulmonary:     Effort: Pulmonary effort is normal. No respiratory distress.     Breath sounds: Normal breath sounds. No wheezing or rales.  Abdominal:     General: Bowel sounds are normal. There is no distension.     Palpations: Abdomen is soft.     Tenderness: There is no abdominal tenderness.  Musculoskeletal:        General: Normal range of motion.     Cervical back: Normal range of motion and neck supple.     Comments: There is tenderness to palpation in the lower lumbar/sacral region.  There is no bony tenderness or step-off.  Skin:    General: Skin is warm and dry.  Neurological:     Mental Status: He is alert and oriented to person, place, and time.     Coordination: Coordination normal.     Comments: Strength is 5 out of 5 in both  lower extremities.  DP pulses are easily palpable and motor and sensation are intact throughout both legs and feet.     ED Results / Procedures / Treatments   Labs (all labs ordered are listed, but only abnormal results are displayed) Labs Reviewed - No data to display  EKG None  Radiology DG Lumbar Spine Complete  Result Date: 08/19/2022 CLINICAL DATA:  Pain, injury. EXAM: LUMBAR SPINE - COMPLETE 4+ VIEW COMPARISON:  None Available. FINDINGS: There is no evidence of lumbar spine fracture. Alignment is normal. Intervertebral disc spaces are maintained. Mild degenerative endplate osteophytes are seen throughout the lumbar spine. Soft tissues are within normal limits. IMPRESSION: 1. No evidence for fracture or malalignment. 2. Mild degenerative changes. Electronically Signed   By: Ronney Asters M.D.   On: 08/19/2022 18:53    Procedures Procedures    Medications Ordered in ED Medications - No data to display  ED Course/ Medical Decision Making/ A&P  Patient is a 61 year old male with past medical history as per HPI.  He presents with complaints of a low back injury.  Patient reports falling 2 feet out of the back of a truck and onto his back onto the concrete.  He is having pain in his sacral region and lower lumbar region.  Initial x-rays obtained in triage were negative.  Due to the degree of discomfort, I did elect to obtain a CT scan of the lumbar spine and pelvis to rule out pelvic fracture, lumbar spine fracture, or sacral fracture.  This study was also negative.  At this point, this appears to be a contusion which I feel can safely be discharged with pain medication, rest, and follow-up as needed.  Final Clinical Impression(s) / ED Diagnoses Final diagnoses:  None    Rx / DC Orders ED Discharge Orders     None         Veryl Speak, MD 08/20/22 [phone]

## 2022-08-20 NOTE — ED Notes (Signed)
Patient transported to CT scan . 

## 2022-10-31 ENCOUNTER — Encounter: Payer: Self-pay | Admitting: Gastroenterology

## 2023-02-22 ENCOUNTER — Other Ambulatory Visit: Payer: Self-pay | Admitting: Family Medicine

## 2023-02-22 DIAGNOSIS — Z8739 Personal history of other diseases of the musculoskeletal system and connective tissue: Secondary | ICD-10-CM

## 2023-04-13 ENCOUNTER — Encounter (HOSPITAL_COMMUNITY): Payer: Self-pay

## 2023-04-13 ENCOUNTER — Ambulatory Visit (HOSPITAL_COMMUNITY)
Admission: EM | Admit: 2023-04-13 | Discharge: 2023-04-13 | Disposition: A | Discharge status: Home / Self Care | Payer: BC Managed Care – PPO | Attending: Physician Assistant | Admitting: Physician Assistant

## 2023-04-13 DIAGNOSIS — Z8739 Personal history of other diseases of the musculoskeletal system and connective tissue: Secondary | ICD-10-CM | POA: Diagnosis not present

## 2023-04-13 DIAGNOSIS — Z760 Encounter for issue of repeat prescription: Secondary | ICD-10-CM

## 2023-04-13 DIAGNOSIS — I10 Essential (primary) hypertension: Secondary | ICD-10-CM

## 2023-04-13 DIAGNOSIS — E1165 Type 2 diabetes mellitus with hyperglycemia: Secondary | ICD-10-CM | POA: Diagnosis not present

## 2023-04-13 DIAGNOSIS — Z7984 Long term (current) use of oral hypoglycemic drugs: Secondary | ICD-10-CM

## 2023-04-13 LAB — CBC WITH DIFFERENTIAL/PLATELET
Abs Immature Granulocytes: 0.03 10*3/uL (ref 0.00–0.07)
Basophils Absolute: 0.1 10*3/uL (ref 0.0–0.1)
Basophils Relative: 1 %
Eosinophils Absolute: 0.2 10*3/uL (ref 0.0–0.5)
Eosinophils Relative: 4 %
HCT: 47.9 % (ref 39.0–52.0)
Hemoglobin: 16.4 g/dL (ref 13.0–17.0)
Immature Granulocytes: 1 %
Lymphocytes Relative: 48 %
Lymphs Abs: 2.6 10*3/uL (ref 0.7–4.0)
MCH: 32.2 pg (ref 26.0–34.0)
MCHC: 34.2 g/dL (ref 30.0–36.0)
MCV: 94.1 fL (ref 80.0–100.0)
Monocytes Absolute: 0.5 10*3/uL (ref 0.1–1.0)
Monocytes Relative: 9 %
Neutro Abs: 2 10*3/uL (ref 1.7–7.7)
Neutrophils Relative %: 37 %
Platelets: 282 10*3/uL (ref 150–400)
RBC: 5.09 MIL/uL (ref 4.22–5.81)
RDW: 11.9 % (ref 11.5–15.5)
WBC: 5.5 10*3/uL (ref 4.0–10.5)
nRBC: 0 % (ref 0.0–0.2)

## 2023-04-13 LAB — COMPREHENSIVE METABOLIC PANEL
ALT: 23 U/L (ref 0–44)
AST: 27 U/L (ref 15–41)
Albumin: 4.2 g/dL (ref 3.5–5.0)
Alkaline Phosphatase: 58 U/L (ref 38–126)
Anion gap: 10 (ref 5–15)
BUN: 27 mg/dL — ABNORMAL HIGH (ref 6–20)
CO2: 22 mmol/L (ref 22–32)
Calcium: 9.5 mg/dL (ref 8.9–10.3)
Chloride: 103 mmol/L (ref 98–111)
Creatinine, Ser: 1.36 mg/dL — ABNORMAL HIGH (ref 0.61–1.24)
GFR, Estimated: 60 mL/min — ABNORMAL LOW (ref >60)
Glucose, Bld: 117 mg/dL — ABNORMAL HIGH (ref 70–99)
Potassium: 4.5 mmol/L (ref 3.5–5.1)
Sodium: 135 mmol/L (ref 135–145)
Total Bilirubin: 0.6 mg/dL (ref 0.3–1.2)
Total Protein: 8.1 g/dL (ref 6.5–8.1)

## 2023-04-13 LAB — POCT URINALYSIS DIP (MANUAL ENTRY)
Bilirubin, UA: NEGATIVE
Blood, UA: NEGATIVE
Glucose, UA: >=1000 mg/dL — AB
Ketones, POC UA: NEGATIVE mg/dL
Leukocytes, UA: NEGATIVE
Nitrite, UA: POSITIVE — AB
Protein Ur, POC: NEGATIVE mg/dL
Spec Grav, UA: 1.025 (ref 1.010–1.025)
Urobilinogen, UA: 0.2 E.U./dL
pH, UA: 5.5 (ref 5.0–8.0)

## 2023-04-13 LAB — HEMOGLOBIN A1C
Hgb A1c MFr Bld: 7.5 % — ABNORMAL HIGH (ref 4.8–5.6)
Mean Plasma Glucose: 168.55 mg/dL

## 2023-04-13 MED ORDER — TELMISARTAN-HCTZ 80-25 MG PO TABS: 1.0000 | ORAL_TABLET | Freq: Every day | ORAL | 2 refills | Status: DC

## 2023-04-13 MED ORDER — AMLODIPINE BESYLATE 10 MG PO TABS: 10.0000 mg | ORAL_TABLET | Freq: Every day | ORAL | 2 refills | Status: DC

## 2023-04-13 MED ORDER — ALLOPURINOL 100 MG PO TABS: 100.0000 mg | ORAL_TABLET | Freq: Every day | ORAL | 2 refills | Status: DC

## 2023-04-13 MED ORDER — JARDIANCE 10 MG PO TABS: 10.0000 mg | ORAL_TABLET | Freq: Every day | ORAL | 2 refills | Status: DC

## 2023-04-13 MED ORDER — GLIPIZIDE ER 10 MG PO TB24: 10.0000 mg | ORAL_TABLET | Freq: Every day | ORAL | 2 refills | Status: DC

## 2023-04-13 NOTE — ED Triage Notes (Signed)
Pt states that he is here for medication refill. Patient doesn't have a primary care.   glipiZIDE (GLUCOTROL) 10 MG tablet   JARDIANCE 10 MG TABS tablet  amLODipine (NORVASC) 10 MG tablet  allopurinol (ZYLOPRIM) 100 MG tablet  telmisartan-hydrochlorothiazide (MICARDIS HCT) 80-25 MG tablet  HYDROcodone-acetaminophen (NORCO) 5-325 MG tablet

## 2023-04-13 NOTE — ED Provider Notes (Signed)
[facility]    CSN: [id number] Arrival date & time: 04/13/23  1231      History   Chief Complaint Chief Complaint  Patient presents with   Medication Refill    HPI Guy Horton is a 62 y.o. male.   Patient presents today for medication refill.  He is between jobs after starting a new job which will give him new insurance.  He has not run out of his medication but is running low prompting evaluation today.  He does have a history of diabetes.  His last A1c was from 2021 and was 8.2%.  He reports that it has been better controlled but he can remember the exact numbers.  He is not monitoring his blood sugar regularly.  His current regimen includes glipizide extended release 10 mg daily and Jardiance 10 mg daily.  He reports compliance with medication without side effects or concerns.  He is trying to eat a healthy diet and has a physically demanding job.  Denies any polyuria, polydipsia, polyphagia, symptomatic hyper or hypoglycemia.  He has never taken insulin.  In addition, patient has a history of hypertension.  Current regimen includes telmisartan/hydrochlorothiazide combination as well as amlodipine.  Reports taking medication as prescribed.  He does monitor his diet for salt and has a physically demanding job.  Denies any chest pain, shortness of breath, headache, vision change, dizziness.  He has a history of gout.  He is currently taking allopurinol 100 mg daily.  Reports that this is significantly decreased the frequency of his gout flares.  His last gout flare was several months ago and was triggered by drinking over the holidays.  He typically uses hydrocodone for pain relief when he has a gout flare and is requesting refill of this today.    Past Medical History:  Diagnosis Date   Arthritis    Diabetes (HCC)    Gout    Hyperlipemia    Hypertension     Patient Active Problem List   Diagnosis Date Noted   New onset type 2 diabetes mellitus (HCC) 07/16/2017    Hematoma of right thigh 09/13/2015   Laceration of lower leg with complication 09/13/2015   Non compliance w medication regimen 11/20/2014   Gout 08/13/2012   HTN (hypertension) 08/13/2012   Erectile dysfunction 08/13/2012   Hypercholesteremia 08/13/2012    Past Surgical History:  Procedure Laterality Date   I & D EXTREMITY Left 11/14/2016   Procedure: IRRIGATION AND DEBRIDEMENT LEFT INDEX FINGER;  Surgeon: Bradly Bienenstock, MD;  Location: [facility];  Service: Orthopedics;  Laterality: Left;       Home Medications    Prior to Admission medications   Medication Sig Start Date End Date Taking? Authorizing Provider  atorvastatin (LIPITOR) 40 MG tablet atorvastatin 40 mg tablet  Take 1 tablet every day by oral route.   Yes [provider]  Blood Glucose Monitoring Suppl (ONETOUCH VERIO) w/Device KIT  07/18/17  Yes [provider]  Blood Glucose Monitoring Suppl (TRUE METRIX METER) DEVI 1 Device by Does not apply route 4 (four) times daily. 07/17/17  Yes Leatha Gilding, MD  glipiZIDE (GLUCOTROL XL) 10 MG 24 hr tablet Take 1 tablet (10 mg total) by mouth daily with breakfast. 04/13/23  Yes Dadrian Ballantine K, [name]  glucose blood (TRUE METRIX BLOOD GLUCOSE TEST) test strip Use as instructed 12/05/17  Yes Shade Flood, MD  HYDROcodone-acetaminophen (NORCO) 5-325 MG tablet Take 1-2 tablets by mouth every 6 (six) hours as  needed. 08/20/22  Yes Delo, Riley Lam, MD  indomethacin (INDOCIN) 25 MG capsule Take by mouth.   Yes [provider]  mupirocin ointment (BACTROBAN) 2 % Apply 1 Application topically 2 (two) times daily. 07/26/22  Yes Shondell Fabel K, [name]  pantoprazole (PROTONIX) 20 MG tablet Take 1 tablet (20 mg total) by mouth daily. 06/04/22  Yes Harlene Salts A, [name]  TRUEPLUS LANCETS 28G MISC 1 each by Does not apply route 4 (four) times daily. 07/17/17  Yes Leatha Gilding, MD  allopurinol (ZYLOPRIM) 100 MG tablet Take 1 tablet (100 mg total) by mouth daily. 04/13/23    Retaj Hilbun K, [name]  amLODipine (NORVASC) 10 MG tablet Take 1 tablet (10 mg total) by mouth daily. 04/13/23   Eduarda Scrivens K, [name]  JARDIANCE 10 MG TABS tablet Take 1 tablet (10 mg total) by mouth daily. 04/13/23   Cuyler Vandyken, Noberto Retort, [name]  telmisartan-hydrochlorothiazide (MICARDIS HCT) 80-25 MG tablet Take 1 tablet by mouth daily. 04/13/23   Atziri Zubiate, Noberto Retort, [name]    Family History Family History  Problem Relation Age of Onset   Diabetes Mother    Other Father 66       Work accident   Prostate cancer Father    Colon cancer Neg Hx    Colon polyps Neg Hx    Esophageal cancer Neg Hx    Rectal cancer Neg Hx    Stomach cancer Neg Hx     Social History Social History   Tobacco Use   Smoking status: Never   Smokeless tobacco: Never  Vaping Use   Vaping Use: Never used  Substance Use Topics   Alcohol use: Yes    Comment: 3-4 beers daily   Drug use: No     Allergies   Patient has no known allergies.   Review of Systems Review of Systems  Constitutional:  Positive for activity change. Negative for appetite change, fatigue and fever.  Eyes:  Negative for visual disturbance.  Respiratory:  Negative for cough and shortness of breath.   Cardiovascular:  Negative for chest pain, palpitations and leg swelling.  Gastrointestinal:  Negative for abdominal pain, diarrhea, nausea and vomiting.  Endocrine: Negative for polydipsia, polyphagia and polyuria.  Musculoskeletal:  Negative for arthralgias and myalgias.  Neurological:  Negative for dizziness, light-headedness and headaches.     Physical Exam Triage Vital Signs ED Triage Vitals [04/13/23 1323]  Enc Vitals Group     BP (!) 153/91     Pulse Rate 74     Resp 18     Temp 98.4 F (36.9 C)     Temp src      SpO2 94 %     Weight      Height      Head Circumference      Peak Flow      Pain Score      Pain Loc      Pain Edu?      Excl. in GC?    No data found.  Updated Vital Signs BP (!) 151/91 (BP Location: Left Arm)    Pulse 74   Temp 98.4 F (36.9 C)   Resp 18   SpO2 95%   Visual Acuity Right Eye Distance:   Left Eye Distance:   Bilateral Distance:    Right Eye Near:   Left Eye Near:    Bilateral Near:     Physical Exam Vitals reviewed.  Constitutional:      General: He is awake.  Appearance: Normal appearance. He is well-developed. He is not ill-appearing.     Comments: Very pleasant male appears stated age in no acute distress sitting comfortably in exam room  HENT:     Head: Normocephalic and atraumatic.  Cardiovascular:     Rate and Rhythm: Normal rate and regular rhythm.     Heart sounds: Normal heart sounds, S1 normal and S2 normal. No murmur heard. Pulmonary:     Effort: Pulmonary effort is normal.     Breath sounds: Normal breath sounds. No stridor. No wheezing, rhonchi or rales.     Comments: Clear to auscultation bilaterally Abdominal:     General: Bowel sounds are normal.     Palpations: Abdomen is soft.     Tenderness: There is no abdominal tenderness.  Musculoskeletal:     Right lower leg: No edema.     Left lower leg: No edema.  Neurological:     Mental Status: He is alert.  Psychiatric:        Behavior: Behavior is cooperative.      UC Treatments / Results  Labs (all labs ordered are listed, but only abnormal results are displayed) Labs Reviewed  POCT URINALYSIS DIP (MANUAL ENTRY) - Abnormal; Notable for the following components:      Result Value   Glucose, UA >=1,000 (*)    Nitrite, UA Positive (*)    All other components within normal limits  URINE CULTURE  CBC WITH DIFFERENTIAL/PLATELET  COMPREHENSIVE METABOLIC PANEL  HEMOGLOBIN A1C    EKG   Radiology No results found.  Procedures Procedures (including critical care time)  Medications Ordered in UC Medications - No data to display  Initial Impression / Assessment and Plan / UC Course  I have reviewed the triage vital signs and the nursing notes.  Pertinent labs & imaging results that  were available during my care of the patient were reviewed by me and considered in my medical decision making (see chart for details).     Patient is mildly hypertensive but otherwise well-appearing, afebrile, nontoxic.  Will check basic labs including CBC, CMP, A1c.  UA was obtained that showed glucosuria consistent with SGLT2 inhibitor use as well as positive nitrates.  Upon further discussion he did report some urinary frequency and urgency which he attributed to his medication and diabetes.  Will send his urine for culture since it was positive for nitrites but defer antibiotics until results are available.  He was provided refills of Jardiance, telmisartan/hydrochlorothiazide, amlodipine, glipizide, allopurinol as requested.  We will make medication adjustment based on lab work.  Discussed that ultimately he will need to see primary care so we will try to establish him with someone via PCP assistance.  Discussed that we are unable to provide refills of hydrocodone use for chronic pain medicine in urgent care to which he expressed understanding.  We discussed healthy lifestyle habits to manage chronic conditions.  Recommended that he monitor his blood sugar and his blood pressure at home and keep a log for evaluation of appointment.  If this is above goal and he has a hard time getting a primary care he can return here for medication adjustment.  If he develops any side effects with medication or concerning symptoms including polyuria, polydipsia, polyphagia, hypoglycemia, headache, chest pain, shortness of breath he should be seen immediately.  Strict return precautions given to which he expressed understanding.  Strict return precautions given to which she expressed understanding.  Final Clinical Impressions(s) / UC Diagnoses  Final diagnoses:  Essential hypertension  History of gout  Type 2 diabetes mellitus with hyperglycemia, without long-term current use of insulin (HCC)  Encounter for  medication refill     Discharge Instructions      I have refilled your medications.  We will contact you if your lab work is abnormal we need to make adjustments.  Please monitor your blood sugar and your blood pressure at home and keep a log for evaluation at follow-up appointment.  Continue with healthy lifestyle changes including healthy diet and regular exercise.  We will help you find a primary care.  Someone should call you to schedule an appointment.  If you have any medication side effects or concerning symptoms including chest pain, shortness of breath, nausea, vomiting, headache, dizziness, vision change should be seen immediately.     ED Prescriptions     Medication Sig Dispense Auth. Provider   JARDIANCE 10 MG TABS tablet Take 1 tablet (10 mg total) by mouth daily. 30 tablet Rosanna Bickle K, [name]   telmisartan-hydrochlorothiazide (MICARDIS HCT) 80-25 MG tablet Take 1 tablet by mouth daily. 30 tablet Macallan Ord K, [name]   allopurinol (ZYLOPRIM) 100 MG tablet Take 1 tablet (100 mg total) by mouth daily. 30 tablet Jjesus Dingley K, [name]   amLODipine (NORVASC) 10 MG tablet Take 1 tablet (10 mg total) by mouth daily. 30 tablet Lyliana Dicenso K, [name]   glipiZIDE (GLUCOTROL XL) 10 MG 24 hr tablet Take 1 tablet (10 mg total) by mouth daily with breakfast. 30 tablet Joylynn Defrancesco K, [name]      I have reviewed the PDMP during this encounter.   Jeani Hawking, [name] 04/13/23 1418

## 2023-04-13 NOTE — Discharge Instructions (Addendum)
I have refilled your medications.  We will contact you if your lab work is abnormal we need to make adjustments.  Please monitor your blood sugar and your blood pressure at home and keep a log for evaluation at follow-up appointment.  Continue with healthy lifestyle changes including healthy diet and regular exercise.  We will help you find a primary care.  Someone should call you to schedule an appointment.  If you have any medication side effects or concerning symptoms including chest pain, shortness of breath, nausea, vomiting, headache, dizziness, vision change should be seen immediately.

## 2023-04-15 LAB — URINE CULTURE: Culture: NO GROWTH

## 2023-04-25 ENCOUNTER — Encounter (HOSPITAL_COMMUNITY): Payer: Self-pay

## 2023-12-24 ENCOUNTER — Encounter (HOSPITAL_COMMUNITY): Payer: Self-pay

## 2023-12-24 ENCOUNTER — Ambulatory Visit (HOSPITAL_COMMUNITY)
Admission: EM | Admit: 2023-12-24 | Discharge: 2023-12-24 | Disposition: A | Discharge status: Home / Self Care | Payer: BC Managed Care – PPO | Attending: Emergency Medicine | Admitting: Emergency Medicine

## 2023-12-24 DIAGNOSIS — M109 Gout, unspecified: Secondary | ICD-10-CM

## 2023-12-24 MED ORDER — COLCHICINE 0.6 MG PO TABS: ORAL_TABLET | ORAL | 0 refills | Status: DC

## 2023-12-24 NOTE — ED Provider Notes (Signed)
 [facility]    CSN: [id number] Arrival date & time: 12/24/23  1615      History   Chief Complaint Chief Complaint  Patient presents with   Knee Pain    HPI Guy Horton is a 63 y.o. male.   Patient presents with right knee pain and swelling x 5 days.  History of gout.  Denies any known injury.  States he has taken hydrocodone  and Goody powder with some relief.  Patient normally takes allopurinol  for gout maintenance but states he ran out of his medication.   Knee Pain   Past Medical History:  Diagnosis Date   Arthritis    Diabetes (HCC)    Gout    Hyperlipemia    Hypertension     Patient Active Problem List   Diagnosis Date Noted   New onset type 2 diabetes mellitus (HCC) 07/16/2017   Hematoma of right thigh 09/13/2015   Laceration of lower leg with complication 09/13/2015   Non compliance w medication regimen 11/20/2014   Gout 08/13/2012   HTN (hypertension) 08/13/2012   Erectile dysfunction 08/13/2012   Hypercholesteremia 08/13/2012    Past Surgical History:  Procedure Laterality Date   I & D EXTREMITY Left 11/14/2016   Procedure: IRRIGATION AND DEBRIDEMENT LEFT INDEX FINGER;  Surgeon: Prentice Pagan, MD;  Location: [facility];  Service: Orthopedics;  Laterality: Left;       Home Medications    Prior to Admission medications   Medication Sig Start Date End Date Taking? Authorizing Provider  colchicine  0.6 MG tablet Take two tablets now and then one tablet an hour later 12/24/23  Yes [name], [name], NP  allopurinol  (ZYLOPRIM ) 100 MG tablet Take 1 tablet (100 mg total) by mouth daily. 04/13/23   [name], [name], [name]  amLODipine  (NORVASC ) 10 MG tablet Take 1 tablet (10 mg total) by mouth daily. 04/13/23   [name], [name], [name]  atorvastatin  (LIPITOR) 40 MG tablet atorvastatin  40 mg tablet  Take 1 tablet every day by oral route.    [provider]  Blood Glucose Monitoring Suppl (ONETOUCH VERIO) w/Device KIT  07/18/17   [provider]   Blood Glucose Monitoring Suppl (TRUE METRIX METER) DEVI 1 Device by Does not apply route 4 (four) times daily. 07/17/17   Gherghe, Costin M, MD  glipiZIDE  (GLUCOTROL  XL) 10 MG 24 hr tablet Take 1 tablet (10 mg total) by mouth daily with breakfast. 04/13/23   [name], Rocky K, [name]  glucose blood (TRUE METRIX BLOOD GLUCOSE TEST) test strip Use as instructed 12/05/17   [name], MD  HYDROcodone -acetaminophen  (NORCO) 5-325 MG tablet Take 1-2 tablets by mouth every 6 (six) hours as needed. 08/20/22   [name], MD  indomethacin  (INDOCIN ) 25 MG capsule Take by mouth.    [provider]  JARDIANCE  10 MG TABS tablet Take 1 tablet (10 mg total) by mouth daily. 04/13/23   [name], [name], [name]  mupirocin  ointment (BACTROBAN ) 2 % Apply 1 Application topically 2 (two) times daily. 07/26/22   [name], [name], [name]  telmisartan -hydrochlorothiazide (MICARDIS  HCT) 80-25 MG tablet Take 1 tablet by mouth daily. 04/13/23   [name], Rocky POUR, [name]  TRUEPLUS LANCETS 28G MISC 1 each by Does not apply route 4 (four) times daily. 07/17/17   [name], MD    Family History Family History  Problem Relation Age of Onset   Diabetes Mother    Other Father 75       Work accident  Prostate cancer Father    Colon cancer Neg Hx    Colon polyps Neg Hx    Esophageal cancer Neg Hx    Rectal cancer Neg Hx    Stomach cancer Neg Hx     Social History Social History   Tobacco Use   Smoking status: Never   Smokeless tobacco: Never  Vaping Use   Vaping status: Never Used  Substance Use Topics   Alcohol use: Yes    Comment: 3-4 beers daily   Drug use: No     Allergies   Patient has no known allergies.   Review of Systems Review of Systems  Musculoskeletal:  Positive for joint swelling.     Physical Exam Triage Vital Signs ED Triage Vitals  Encounter Vitals Group     BP 12/24/23 1744 (!) 150/85     Systolic BP Percentile --      Diastolic BP Percentile --      Pulse Rate 12/24/23 1744 88      Resp 12/24/23 1744 16     Temp 12/24/23 1744 98.7 F (37.1 C)     Temp Source 12/24/23 1744 Oral     SpO2 12/24/23 1744 96 %     Weight 12/24/23 1743 210 lb (95.3 kg)     Height 12/24/23 1743 6' (1.829 m)     Head Circumference --      Peak Flow --      Pain Score 12/24/23 1743 9     Pain Loc --      Pain Education --      Exclude from Growth Chart --    No data found.  Updated Vital Signs BP (!) 150/85 (BP Location: Left Arm)   Pulse 88   Temp 98.7 F (37.1 C) (Oral)   Resp 16   Ht 6' (1.829 m)   Wt 210 lb (95.3 kg)   SpO2 96%   BMI 28.48 kg/m   Visual Acuity Right Eye Distance:   Left Eye Distance:   Bilateral Distance:    Right Eye Near:   Left Eye Near:    Bilateral Near:     Physical Exam Vitals and nursing note reviewed.  Constitutional:      General: He is awake. He is not in acute distress.    Appearance: Normal appearance. He is well-developed and well-groomed.  Musculoskeletal:     Right knee: Swelling and erythema present. Tenderness present.     Left knee: Normal.     Comments: Mild swelling, erythema, warmth, and tenderness to knee joint and surrounding areas.  Neurological:     Mental Status: He is alert.  Psychiatric:        Behavior: Behavior is cooperative.      UC Treatments / Results  Labs (all labs ordered are listed, but only abnormal results are displayed) Labs Reviewed - No data to display  EKG   Radiology No results found.  Procedures Procedures (including critical care time)  Medications Ordered in UC Medications - No data to display  Initial Impression / Assessment and Plan / UC Course  I have reviewed the triage vital signs and the nursing notes.  Pertinent labs & imaging results that were available during my care of the patient were reviewed by me and considered in my medical decision making (see chart for details).     Patient presented with 5-day history of right knee pain and swelling.  Denies any known  injury.  History of gout.  Upon  assessment patient has mild swelling, erythema, warmth, and tenderness to knee joint and surrounding areas.  Prescribed colchicine  for gout flare up.  Informed patient that he has to existing refills of allopurinol  at the pharmacy.  Discussed follow-up, return precautions. Final Clinical Impressions(s) / UC Diagnoses   Final diagnoses:  Acute gout of right knee, unspecified cause     Discharge Instructions      Take 2 tablets of colchicine  when you pick up your prescription and then take the third tablet an hour later.  You currently have 2 refills of allopurinol  ordered at your pharmacy that you can pick up while you are there.  You can follow-up with [facility] if you continue to have pain.  Follow-up with primary care doctor regarding maintenance of gout and other chronic issues.  Return here as needed.    ED Prescriptions     Medication Sig Dispense Auth. Provider   colchicine  0.6 MG tablet Take two tablets now and then one tablet an hour later 3 tablet [name], NP      PDMP not reviewed this encounter.   [name], NP 12/24/23 [phone]

## 2023-12-24 NOTE — ED Triage Notes (Signed)
 Patient here today with c/o right knee pain X 5 days. Patient has a h/o gout. No known injury. He had taken some Hydrocodone from when he had some dental care and Goody powder with some relief. In the past he would take Allopurinol but is out of it.

## 2023-12-24 NOTE — Discharge Instructions (Addendum)
 Take 2 tablets of colchicine  when you pick up your prescription and then take the third tablet an hour later.  You currently have 2 refills of allopurinol  ordered at your pharmacy that you can pick up while you are there.  You can follow-up with [facility] if you continue to have pain.  Follow-up with primary care doctor regarding maintenance of gout and other chronic issues.  Return here as needed.

## 2023-12-25 ENCOUNTER — Telehealth (HOSPITAL_COMMUNITY): Payer: Self-pay

## 2023-12-25 NOTE — Telephone Encounter (Signed)
 Patient is requesting a refill on his Allopurinol . He stated he went to the pharmacy as instructed yesterday but the prescription is expired. Can you please send in a refill?

## 2023-12-27 NOTE — Telephone Encounter (Signed)
I spoke with patient in the triage room today.  He has called several times about having allopurinol prescribed for him.  He was seen on January 14 and prescribed colchicine for an acute gout flare.  He has been out of his allopurinol for about 2 months.  He has been set up with primary care and will see them February 11.  I have explained to the patient that since he has been out of his allopurinol that we are not going to restart this in the urgent care setting.  He can see primary care when he sees them in February and discuss the need for future allopurinol prescription and see if he needs any lab monitoring.

## 2023-12-28 ENCOUNTER — Ambulatory Visit (HOSPITAL_COMMUNITY)
Admission: EM | Admit: 2023-12-28 | Discharge: 2023-12-28 | Disposition: A | Discharge status: Home / Self Care | Payer: Worker's Compensation | Attending: Emergency Medicine | Admitting: Emergency Medicine

## 2023-12-28 ENCOUNTER — Encounter (HOSPITAL_COMMUNITY): Payer: Self-pay

## 2023-12-28 DIAGNOSIS — S61011A Laceration without foreign body of right thumb without damage to nail, initial encounter: Secondary | ICD-10-CM | POA: Diagnosis not present

## 2023-12-28 MED ORDER — MUPIROCIN 2 % EX OINT: 1.0000 | TOPICAL_OINTMENT | Freq: Two times a day (BID) | CUTANEOUS | 0 refills | Status: DC

## 2023-12-28 MED ORDER — LIDOCAINE HCL (PF) 2 % IJ SOLN
INTRAMUSCULAR | Status: AC
  Filled 2023-12-28: qty 5

## 2023-12-28 NOTE — ED Triage Notes (Signed)
Patient was at work today on a for lift. States some sharp metal hooked into the right thumb. Patient denies pain or being on any blood thinners.   Patient states his last TDAP was a year ago.

## 2023-12-28 NOTE — ED Provider Notes (Signed)
[facility]    CSN: [id number] Arrival date & time: 12/28/23  1351      History   Chief Complaint Chief Complaint  Patient presents with   Finger Injury   Work Related Injury    HPI Guy Horton is a 63 y.o. male.   Patient presents to clinic over concern of a laceration to the palmar aspect of his right thumb.  He was at work when a sharp metal hooked into his right thumb.  He has not had any pain.  Has not taken any medication.  Immediately afterwards the area was bleeding a lot, he cleaned it with antibacterial solution and applied a pressure dressing.   Tdap was updated last year.  The history is provided by the patient and medical records.    Past Medical History:  Diagnosis Date   Arthritis    Diabetes (HCC)    Gout    Hyperlipemia    Hypertension     Patient Active Problem List   Diagnosis Date Noted   New onset type 2 diabetes mellitus (HCC) 07/16/2017   Hematoma of right thigh 09/13/2015   Laceration of lower leg with complication 09/13/2015   Non compliance w medication regimen 11/20/2014   Gout 08/13/2012   HTN (hypertension) 08/13/2012   Erectile dysfunction 08/13/2012   Hypercholesteremia 08/13/2012    Past Surgical History:  Procedure Laterality Date   I & D EXTREMITY Left 11/14/2016   Procedure: IRRIGATION AND DEBRIDEMENT LEFT INDEX FINGER;  Surgeon: Bradly Bienenstock, MD;  Location: [facility] OR;  Service: Orthopedics;  Laterality: Left;       Home Medications    Prior to Admission medications   Medication Sig Start Date End Date Taking? Authorizing Provider  allopurinol (ZYLOPRIM) 100 MG tablet Take 1 tablet (100 mg total) by mouth daily. 04/13/23  Yes [name], [name], [name]  amLODipine (NORVASC) 10 MG tablet Take 1 tablet (10 mg total) by mouth daily. 04/13/23  Yes [name], [name], [name]  atorvastatin (LIPITOR) 40 MG tablet atorvastatin 40 mg tablet  Take 1 tablet every day by oral route.   Yes [provider]  Blood Glucose  Monitoring Suppl (ONETOUCH VERIO) w/Device KIT  07/18/17  Yes [provider]  Blood Glucose Monitoring Suppl (TRUE METRIX METER) DEVI 1 Device by Does not apply route 4 (four) times daily. 07/17/17  Yes Leatha Gilding, MD  glipiZIDE (GLUCOTROL XL) 10 MG 24 hr tablet Take 1 tablet (10 mg total) by mouth daily with breakfast. 04/13/23  Yes [name], [name], [name]  glucose blood (TRUE METRIX BLOOD GLUCOSE TEST) test strip Use as instructed 12/05/17  Yes Shade Flood, MD  indomethacin (INDOCIN) 25 MG capsule Take by mouth.   Yes [provider]  JARDIANCE 10 MG TABS tablet Take 1 tablet (10 mg total) by mouth daily. 04/13/23  Yes [name], Denny Peon K, [name]  mupirocin ointment (BACTROBAN) 2 % Apply 1 Application topically 2 (two) times daily. 12/28/23  Yes Rinaldo Ratel, [place] N, FNP  telmisartan-hydrochlorothiazide (MICARDIS HCT) 80-25 MG tablet Take 1 tablet by mouth daily. 04/13/23  Yes [name], [name], [name]  TRUEPLUS LANCETS 28G MISC 1 each by Does not apply route 4 (four) times daily. 07/17/17  Yes Leatha Gilding, MD  colchicine 0.6 MG tablet Take two tablets now and then one tablet an hour later 12/24/23   Letta Kocher, NP    Family History Family History  Problem Relation Age of Onset   Diabetes Mother  Other Father 87       Work accident   Prostate cancer Father    Colon cancer Neg Hx    Colon polyps Neg Hx    Esophageal cancer Neg Hx    Rectal cancer Neg Hx    Stomach cancer Neg Hx     Social History Social History   Tobacco Use   Smoking status: Never   Smokeless tobacco: Never  Vaping Use   Vaping status: Never Used  Substance Use Topics   Alcohol use: Yes    Comment: 3-4 beers daily   Drug use: No     Allergies   Patient has no known allergies.   Review of Systems Review of Systems  Per HPI   Physical Exam Triage Vital Signs ED Triage Vitals [12/28/23 1537]  Encounter Vitals Group     BP 132/73     Systolic BP Percentile      Diastolic BP  Percentile      Pulse Rate 76     Resp 16     Temp 98.5 F (36.9 C)     Temp Source Oral     SpO2 94 %     Weight      Height      Head Circumference      Peak Flow      Pain Score 0     Pain Loc      Pain Education      Exclude from Growth Chart    No data found.  Updated Vital Signs BP 132/73 (BP Location: Left Arm)   Pulse 76   Temp 98.5 F (36.9 C) (Oral)   Resp 16   SpO2 94%   Visual Acuity Right Eye Distance:   Left Eye Distance:   Bilateral Distance:    Right Eye Near:   Left Eye Near:    Bilateral Near:     Physical Exam Vitals and nursing note reviewed.  Constitutional:      Appearance: Normal appearance.  HENT:     Head: Normocephalic and atraumatic.     Right Ear: External ear normal.     Left Ear: External ear normal.     Nose: Nose normal.     Mouth/Throat:     Mouth: Mucous membranes are moist.  Eyes:     Conjunctiva/sclera: Conjunctivae normal.  Cardiovascular:     Rate and Rhythm: Normal rate.     Pulses: Normal pulses.  Pulmonary:     Effort: Pulmonary effort is normal. No respiratory distress.  Musculoskeletal:        General: Normal range of motion.  Skin:    General: Skin is warm and dry.     Findings: Laceration present.     Comments: 3cm laceration to palmar aspect of right thumb, edges able to be well-approximated   Neurological:     General: No focal deficit present.     Mental Status: He is alert and oriented to person, place, and time.  Psychiatric:        Mood and Affect: Mood normal.        Behavior: Behavior normal. Behavior is cooperative.      UC Treatments / Results  Labs (all labs ordered are listed, but only abnormal results are displayed) Labs Reviewed - No data to display  EKG   Radiology No results found.  Procedures Laceration Repair  Date/Time: 12/28/2023 4:11 PM  Performed by: Loni Delbridge, [place] N, FNP Authorized by: Shota Kohrs, [place] N, FNP  Consent:    Consent obtained:  Verbal   Consent  given by:  Patient   Risks discussed:  Infection, need for additional repair, pain, poor cosmetic result and poor wound healing   Alternatives discussed:  No treatment and delayed treatment Universal protocol:    Procedure explained and questions answered to patient or proxy's satisfaction: yes     Patient identity confirmed:  Verbally with patient Anesthesia:    Anesthesia method:  Local infiltration   Local anesthetic:  Lidocaine 2% w/o epi Laceration details:    Location:  Finger   Finger location:  R thumb   Length (cm):  3   Depth (mm):  3 Pre-procedure details:    Preparation:  Patient was prepped and draped in usual sterile fashion Exploration:    Hemostasis achieved with:  Direct pressure   Wound exploration: wound explored through full range of motion and entire depth of wound visualized     Contaminated: no   Treatment:    Area cleansed with:  Chlorhexidine   Amount of cleaning:  Standard   Debridement:  None Skin repair:    Repair method:  Sutures   Suture size:  4-0   Suture material:  Prolene   Suture technique:  Simple interrupted   Number of sutures:  3 Approximation:    Approximation:  Close Repair type:    Repair type:  Simple Post-procedure details:    Dressing:  Non-adherent dressing   Procedure completion:  Tolerated well, no immediate complications  (including critical care time)  Medications Ordered in UC Medications - No data to display  Initial Impression / Assessment and Plan / UC Course  I have reviewed the triage vital signs and the nursing notes.  Pertinent labs & imaging results that were available during my care of the patient were reviewed by me and considered in my medical decision making (see chart for details).  Vitals and triage reviewed, patient is hemodynamically stable.  3 cm laceration to the palmar aspect of the right thumb, no nailbed involvement.  Capillary refill intact distally.  Clean wound with no retained foreign body.   Sensation intact.  Does have thick skin from working with his hands.  No pain.  3 4-0 sutures placed, see procedure note for further details.  Wound care discussed.  Return to clinic in 10 to 14 days for removal.  Plan of care, follow-up care return precautions given, no questions at this time.     Final Clinical Impressions(s) / UC Diagnoses   Final diagnoses:  Laceration of right thumb without foreign body without damage to nail, initial encounter     Discharge Instructions      Today we placed 3 sutures in your right thumb.  Keep the area clean and dry for the next 12 to 24 hours.  Afterwards you can gently clean with warm water and antibacterial solution like Dial or Hibiclens.  Afterwards pat the area dry and apply an antibacterial ointment.  If you are working please keep the area covered.  Return to clinic in 10 to 14 days for suture removal.  Return sooner if you develop signs of infection such as drainage, redness, swelling, or pain.      ED Prescriptions     Medication Sig Dispense Auth. Provider   mupirocin ointment (BACTROBAN) 2 % Apply 1 Application topically 2 (two) times daily. 22 g Zaydrian Batta, [place] N, [place]      PDMP not reviewed this encounter.   Tennyson Wacha, [place] N, FNP  12/28/23 1612  

## 2023-12-28 NOTE — Discharge Instructions (Addendum)
Today we placed 3 sutures in your right thumb.  Keep the area clean and dry for the next 12 to 24 hours.  Afterwards you can gently clean with warm water and antibacterial solution like Dial or Hibiclens.  Afterwards pat the area dry and apply an antibacterial ointment.  If you are working please keep the area covered.  Return to clinic in 10 to 14 days for suture removal.  Return sooner if you develop signs of infection such as drainage, redness, swelling, or pain.

## 2024-01-06 ENCOUNTER — Ambulatory Visit (HOSPITAL_COMMUNITY): Payer: BC Managed Care – PPO

## 2024-01-08 ENCOUNTER — Other Ambulatory Visit: Payer: Self-pay

## 2024-01-08 ENCOUNTER — Emergency Department (HOSPITAL_COMMUNITY)
Admission: EM | Admit: 2024-01-08 | Discharge: 2024-01-08 | Disposition: A | Discharge status: Home / Self Care | Payer: 59 | Attending: Emergency Medicine | Admitting: Emergency Medicine

## 2024-01-08 DIAGNOSIS — M109 Gout, unspecified: Secondary | ICD-10-CM | POA: Diagnosis not present

## 2024-01-08 DIAGNOSIS — M10061 Idiopathic gout, right knee: Secondary | ICD-10-CM | POA: Diagnosis not present

## 2024-01-08 DIAGNOSIS — M25561 Pain in right knee: Secondary | ICD-10-CM | POA: Diagnosis not present

## 2024-01-08 MED ORDER — PREDNISONE 20 MG PO TABS: 40.0000 mg | ORAL_TABLET | Freq: Every day | ORAL | 0 refills | Status: DC

## 2024-01-08 MED ORDER — HYDROCODONE-ACETAMINOPHEN 5-325 MG PO TABS
1.0000 | ORAL_TABLET | Freq: Once | ORAL | Status: DC
  Filled 2024-01-08: qty 1

## 2024-01-08 MED ORDER — HYDROCODONE-ACETAMINOPHEN 5-325 MG PO TABS: 2.0000 | ORAL_TABLET | Freq: Four times a day (QID) | ORAL | 0 refills | Status: DC | PRN

## 2024-01-08 NOTE — ED Triage Notes (Signed)
Pt to ED by POV with c/o gout flare up. Flare up started last Saturday. He was seen in UC and given meds with relief. He takes Allopurinol for gout but is OOM, he's looking for PCP to continue care.

## 2024-01-08 NOTE — Discharge Instructions (Addendum)
You were seen in the emergency room today for right knee pain.  Symptoms are consistent with gout.  I have sent prednisone to your pharmacy, please take as prescribed.  Also sent Norco to take for pain control.  I did recommend primarily taking Tylenol 4 times a day can take up to 1000 mg.  Return to ER with new or worsening symptoms. Follow up with primary care physician.

## 2024-01-08 NOTE — ED Provider Notes (Signed)
[facility] Provider Note   CSN: [id number] Arrival date & time: 01/08/24  1004     History  Chief Complaint  Patient presents with   Gout    Guy Horton is a 63 y.o. male with past medical history of gout presenting to the emergency room with 3 days of right knee pain and swelling.  Patient reports that of days ago he had similar symptoms and went to urgent care and was given colchicine.  He took colchicine which helped improve his symptoms however they returned a few days ago.  Patient reports he had joint aspiration several years ago which confirmed his gout and he had been on allopurinol ever since.  Reports he ran out of his allopurinol and now he is having increased gout flareups. Patient reports he is in between doctor but does have 90 day prescription of other medications. Reports BG averages around 90-120 at home.  Patient reports he is working on getting primary care doctor. Reports feels like prior gout. Denies any fever, chills, injuries or falls.  HPI     Home Medications Prior to Admission medications   Medication Sig Start Date End Date Taking? Authorizing Provider  allopurinol (ZYLOPRIM) 100 MG tablet Take 1 tablet (100 mg total) by mouth daily. 04/13/23   [name], [name], [name]  amLODipine (NORVASC) 10 MG tablet Take 1 tablet (10 mg total) by mouth daily. 04/13/23   [name], Noberto Retort, [name]  atorvastatin (LIPITOR) 40 MG tablet atorvastatin 40 mg tablet  Take 1 tablet every day by oral route.    [provider]  Blood Glucose Monitoring Suppl (ONETOUCH VERIO) w/Device KIT  07/18/17   [provider]  Blood Glucose Monitoring Suppl (TRUE METRIX METER) DEVI 1 Device by Does not apply route 4 (four) times daily. 07/17/17   Leatha Gilding, MD  colchicine 0.6 MG tablet Take two tablets now and then one tablet an hour later 12/24/23   Wynonia Lawman A, NP  glipiZIDE (GLUCOTROL XL) 10 MG 24 hr tablet Take 1 tablet (10 mg  total) by mouth daily with breakfast. 04/13/23   [name], Denny Peon K, [name]  glucose blood (TRUE METRIX BLOOD GLUCOSE TEST) test strip Use as instructed 12/05/17   Shade Flood, MD  indomethacin (INDOCIN) 25 MG capsule Take by mouth.    [provider]  JARDIANCE 10 MG TABS tablet Take 1 tablet (10 mg total) by mouth daily. 04/13/23   [name], Noberto Retort, [name]  mupirocin ointment (BACTROBAN) 2 % Apply 1 Application topically 2 (two) times daily. 12/28/23   Garrison, [place] N, FNP  telmisartan-hydrochlorothiazide (MICARDIS HCT) 80-25 MG tablet Take 1 tablet by mouth daily. 04/13/23   [name], Noberto Retort, [name]  TRUEPLUS LANCETS 28G MISC 1 each by Does not apply route 4 (four) times daily. 07/17/17   Leatha Gilding, MD      Allergies    Patient has no known allergies.    Review of Systems   Review of Systems  Musculoskeletal:  Positive for arthralgias.    Physical Exam Updated Vital Signs BP (!) 179/104 (BP Location: Left Arm)   Pulse 63   Temp 98.2 F (36.8 C) (Oral)   Resp 16   Ht 5\' 11"  (1.803 m)   Wt 88.9 kg   SpO2 99%   BMI 27.32 kg/m  Physical Exam Vitals and nursing note reviewed.  Constitutional:      General: He is not in acute distress.  Appearance: He is not toxic-appearing.  HENT:     Head: Normocephalic and atraumatic.  Eyes:     General: No scleral icterus.    Conjunctiva/sclera: Conjunctivae normal.  Cardiovascular:     Rate and Rhythm: Normal rate and regular rhythm.     Pulses: Normal pulses.     Heart sounds: Normal heart sounds.  Pulmonary:     Effort: Pulmonary effort is normal. No respiratory distress.     Breath sounds: Normal breath sounds.  Abdominal:     General: Abdomen is flat. Bowel sounds are normal.     Palpations: Abdomen is soft.     Tenderness: There is no abdominal tenderness.  Musculoskeletal:     Comments: Right knee swelling and tenderness.   Skin:    General: Skin is warm and dry.     Findings: No lesion.  Neurological:      General: No focal deficit present.     Mental Status: He is alert and oriented to person, place, and time. Mental status is at baseline.     ED Results / Procedures / Treatments   Labs (all labs ordered are listed, but only abnormal results are displayed) Labs Reviewed - No data to display  EKG None  Radiology No results found.  Procedures Procedures    Medications Ordered in ED Medications - No data to display  ED Course/ Medical Decision Making/ A&P                                 Medical Decision Making Risk Prescription drug management.   This patient presents to the ED for concern of right knee pain, this involves an extensive number of treatment options, and is a complaint that carries with it a high risk of complications and morbidity.  The differential diagnosis includes arthritis, gout, pseudogout, septic joint     Problem List / ED Course / Critical interventions / Medication management  Review note from UC for similar presentation. Patient well appearing and hemodynamically stable. No fevers or chills at home. He is neurovascularly intact. Right knee without errythema or sign of infection. Patient reports pain occurred suddenly with no injury and feels like prior gout. Exam is consistent with gout. Discussed starting prednisone for 5 days for gout and symptoms control. Patient will monitor BG closely, he reports PCP f/u next week. Will return to ER if treatment does not improve symptoms.  Patient drove himself to ER and does not want pain meds at this time, will go to pharmacy and pick up prescription today. Discussed patient elevated BP, he reports he has not taken BP medication today, does not need refill. Will closely monitor at home. Stable for d/c  I have reviewed the patients home medicines and have made adjustments as needed   Plan  F/u w/ PCP in 2-3d to ensure resolution of sx.  Patient was given return precautions. Patient stable for discharge at  this time.  Patient educated on sx/dx and verbalized understanding of plan. Return to ER w/ new or worsening sx.          Final Clinical Impression(s) / ED Diagnoses Final diagnoses:  Acute gout of right knee, unspecified cause    Rx / DC Orders ED Discharge Orders     None         Smitty Knudsen, [name] 01/08/24 1834    Terald Sleeper, MD 01/09/24 [phone]

## 2024-01-21 ENCOUNTER — Encounter: Payer: Self-pay | Admitting: Internal Medicine

## 2024-01-21 ENCOUNTER — Ambulatory Visit (INDEPENDENT_AMBULATORY_CARE_PROVIDER_SITE_OTHER): Payer: 59 | Admitting: Internal Medicine

## 2024-01-21 VITALS — BP 150/86 | HR 83 | Temp 98.7°F | Ht 71.0 in | Wt 220.2 lb

## 2024-01-21 DIAGNOSIS — Z7984 Long term (current) use of oral hypoglycemic drugs: Secondary | ICD-10-CM

## 2024-01-21 DIAGNOSIS — M1A9XX0 Chronic gout, unspecified, without tophus (tophi): Secondary | ICD-10-CM

## 2024-01-21 DIAGNOSIS — Z125 Encounter for screening for malignant neoplasm of prostate: Secondary | ICD-10-CM

## 2024-01-21 DIAGNOSIS — E119 Type 2 diabetes mellitus without complications: Secondary | ICD-10-CM

## 2024-01-21 DIAGNOSIS — N529 Male erectile dysfunction, unspecified: Secondary | ICD-10-CM

## 2024-01-21 DIAGNOSIS — I10 Essential (primary) hypertension: Secondary | ICD-10-CM

## 2024-01-21 DIAGNOSIS — E7800 Pure hypercholesterolemia, unspecified: Secondary | ICD-10-CM | POA: Diagnosis not present

## 2024-01-21 LAB — POCT GLYCOSYLATED HEMOGLOBIN (HGB A1C): Hemoglobin A1C: 7.6 % — AB (ref 4.0–5.6)

## 2024-01-21 MED ORDER — JARDIANCE 10 MG PO TABS: 10.0000 mg | ORAL_TABLET | Freq: Every day | ORAL | 1 refills | Status: DC

## 2024-01-21 MED ORDER — ALLOPURINOL 100 MG PO TABS: 100.0000 mg | ORAL_TABLET | Freq: Every day | ORAL | 1 refills | Status: DC

## 2024-01-21 MED ORDER — GLIPIZIDE ER 10 MG PO TB24: 10.0000 mg | ORAL_TABLET | Freq: Every day | ORAL | 1 refills | Status: DC

## 2024-01-21 MED ORDER — AMLODIPINE BESYLATE 10 MG PO TABS: 10.0000 mg | ORAL_TABLET | Freq: Every day | ORAL | 1 refills | Status: DC

## 2024-01-21 NOTE — Progress Notes (Unsigned)
[facility] [address] [place] [address] Dept: [phone] Dept Fax: [phone]  New Patient Office Visit  Subjective:   Guy Horton [birth year] 01/21/2024  Chief Complaint  Patient presents with   Establish Care   Medication Refill    HPI: Guy Horton presents today to establish care at [facility] at [facility]. Introduced to Publishing rights manager role and practice setting.  All questions answered.  Concerns: See below   Discussed the use of AI scribe software for clinical note transcription with the patient, who gave verbal consent to proceed.  History of Present Illness   The patient, with a history of hypertension, hyperlipidemia, diabetes, and gout, presents to establish care. He reports a recent flare-up of gout, having been out of his allopurinol for a couple of months. He has been taking telmisartan hydrochlorothiazide for his hypertension and has not been out of this medication. He has been out of his atorvastatin, a cholesterol medication, for a couple of months. He also takes amlodipine for his blood pressure. For his diabetes, he takes glipizide and Jardiance daily and reports his blood sugars have been running around 150-160. He also reports frequent urination at night and foamy urine. He has not seen a kidney doctor in the past. He also reports erectile dysfunction and has been having trouble maintaining an erection. He has not seen a urologist for this issue.       The following portions of the patient's history were reviewed and updated as appropriate: past medical history, past surgical history, family history, social history, allergies, medications, and problem list.   Patient Active Problem List   Diagnosis Date Noted   New onset type 2 diabetes mellitus (HCC) 07/16/2017   Non compliance w medication regimen 11/20/2014   Gout 08/13/2012   HTN (hypertension) 08/13/2012   Erectile  dysfunction 08/13/2012   Hypercholesteremia 08/13/2012   Past Medical History:  Diagnosis Date   Arthritis    Diabetes (HCC)    Gout    Hyperlipemia    Hypertension    Past Surgical History:  Procedure Laterality Date   I & D EXTREMITY Left 11/14/2016   Procedure: IRRIGATION AND DEBRIDEMENT LEFT INDEX FINGER;  Surgeon: Bradly Bienenstock, MD;  Location: [facility] OR;  Service: Orthopedics;  Laterality: Left;   Family History  Problem Relation Age of Onset   Diabetes Mother    Other Father 31       Work accident   Prostate cancer Father    Colon cancer Neg Hx    Colon polyps Neg Hx    Esophageal cancer Neg Hx    Rectal cancer Neg Hx    Stomach cancer Neg Hx     Current Outpatient Medications:    atorvastatin (LIPITOR) 40 MG tablet, atorvastatin 40 mg tablet  Take 1 tablet every day by oral route., Disp: , Rfl:    Blood Glucose Monitoring Suppl (ONETOUCH VERIO) w/Device KIT, , Disp: , Rfl:    Blood Glucose Monitoring Suppl (TRUE METRIX METER) DEVI, 1 Device by Does not apply route 4 (four) times daily., Disp: 1 Device, Rfl: 0   colchicine 0.6 MG tablet, Take two tablets now and then one tablet an hour later, Disp: 3 tablet, Rfl: 0   glucose blood (TRUE METRIX BLOOD GLUCOSE TEST) test strip, Use as instructed, Disp: 100 each, Rfl: 2   HYDROcodone-acetaminophen (NORCO/VICODIN) 5-325 MG tablet, Take 2 tablets by mouth every 6 (six) hours as needed for severe pain (  pain score 7-10)., Disp: 7 tablet, Rfl: 0   predniSONE (DELTASONE) 20 MG tablet, Take 2 tablets (40 mg total) by mouth daily., Disp: 10 tablet, Rfl: 0   telmisartan-hydrochlorothiazide (MICARDIS HCT) 80-25 MG tablet, Take 1 tablet by mouth daily., Disp: 30 tablet, Rfl: 2   TRUEPLUS LANCETS 28G MISC, 1 each by Does not apply route 4 (four) times daily., Disp: 100 each, Rfl: 2   allopurinol (ZYLOPRIM) 100 MG tablet, Take 1 tablet (100 mg total) by mouth daily., Disp: 90 tablet, Rfl: 1   amLODipine (NORVASC) 10 MG tablet, Take 1 tablet  (10 mg total) by mouth daily., Disp: 90 tablet, Rfl: 1   glipiZIDE (GLUCOTROL XL) 10 MG 24 hr tablet, Take 1 tablet (10 mg total) by mouth daily with breakfast., Disp: 90 tablet, Rfl: 1   JARDIANCE 10 MG TABS tablet, Take 1 tablet (10 mg total) by mouth daily., Disp: 90 tablet, Rfl: 1   mupirocin ointment (BACTROBAN) 2 %, Apply 1 Application topically 2 (two) times daily., Disp: 22 g, Rfl: 0 No Known Allergies  ROS: A complete ROS was performed with pertinent positives/negatives noted in the HPI. The remainder of the ROS are negative.   Objective:   Today's Vitals   01/21/24 1513 01/21/24 1545  BP: (!) 160/88 (!) 150/86  Pulse: 83   Temp: 98.7 F (37.1 C)   TempSrc: Temporal   SpO2: 96%   Weight: 220 lb 3.2 oz (99.9 kg)   Height: 5\' 11"  (1.803 m)     GENERAL: Well-appearing, in NAD. Well nourished.  SKIN: Pink, warm and dry. No rash, lesion, ulceration, or ecchymoses.  NECK: Trachea midline. Full ROM w/o pain or tenderness. No lymphadenopathy.  RESPIRATORY: Chest wall symmetrical. Respirations even and non-labored. Breath sounds clear to auscultation bilaterally.  CARDIAC: S1, S2 present, regular rate and rhythm. Peripheral pulses 2+ bilaterally.  EXTREMITIES: Without clubbing, cyanosis, or edema.  NEUROLOGIC: No motor or sensory deficits. Steady, even gait.  PSYCH/MENTAL STATUS: Alert, oriented x 3. Cooperative, appropriate mood and affect.   Health Maintenance Due  Topic Date Due   OPHTHALMOLOGY EXAM  Never done   FOOT EXAM  12/20/2020   DTaP/Tdap/Td (2 - Td or Tdap) 03/18/2022   Colonoscopy  10/14/2022    Assessment & Plan:  Assessment and Plan    Hypertension Elevated blood pressure today. Patient reports recent dietary indiscretions with high sodium intake. Currently on Telmisartan-Hydrochlorothiazide and Amlodipine. -Continue current antihypertensive regimen. -Advise patient to monitor blood pressure at home and aim for readings less than 140/90. -Encourage low  sodium diet. -Check kidney function today due to potential strain from Hydrochlorothiazide. -Plan to adjust antihypertensive regimen based on kidney function results.  Hyperlipidemia Patient has been off Atorvastatin 40mg  for a couple of months. -Resume Atorvastatin 40mg  daily.  Type 2 Diabetes Mellitus Patient reports blood sugars around 150-160. A1c today is 7.6. Currently on Glipizide 10mg  and Jardiance 10mg . -Continue current antidiabetic regimen. -Encourage low carbohydrate diet. -Plan to monitor A1c and adjust medications as needed.  Gout Recent flare, patient had been off Allopurinol 100mg  for a couple of months. -Resume Allopurinol 100mg  daily.  Erectile Dysfunction Patient reports frequent urination at night and difficulty maintaining an erection. -Order urinalysis to check for proteinuria. -Refer to Urology for further evaluation and management.  Follow-up in 3 weeks to recheck blood pressure       Orders Placed This Encounter  Procedures   CBC with Differential/Platelet   Comprehensive metabolic panel   TSH   Lipid  panel   PSA   Microalbumin / creatinine urine ratio   Ambulatory referral to Urology    Referral Priority:   Routine    Referral Type:   Consultation    Referral Reason:   Specialty Services Required    Requested Specialty:   Urology    Number of Visits Requested:   1   POCT glycosylated hemoglobin (Hb A1C)   Meds ordered this encounter  Medications   allopurinol (ZYLOPRIM) 100 MG tablet    Sig: Take 1 tablet (100 mg total) by mouth daily.    Dispense:  90 tablet    Refill:  1    Supervising Provider:   Garnette Gunner [[id number]]   amLODipine (NORVASC) 10 MG tablet    Sig: Take 1 tablet (10 mg total) by mouth daily.    Dispense:  90 tablet    Refill:  1    Supervising Provider:   Garnette Gunner [[id number]]   JARDIANCE 10 MG TABS tablet    Sig: Take 1 tablet (10 mg total) by mouth daily.    Dispense:  90 tablet    Refill:  1     Supervising Provider:   Garnette Gunner [[id number]]   glipiZIDE (GLUCOTROL XL) 10 MG 24 hr tablet    Sig: Take 1 tablet (10 mg total) by mouth daily with breakfast.    Dispense:  90 tablet    Refill:  1    Supervising Provider:   Garnette Gunner [[id number]]    Return in about 3 weeks (around 02/11/2024) for blood pressure check.   Salvatore Decent, FNP

## 2024-01-21 NOTE — Patient Instructions (Signed)
BLOOD PRESSURE: Obtain an automatic blood pressure machine if you do not have one.   Check your blood pressure at home, write down blood pressure readings and bring to next appointment.  Goal is BP less than 140/90 consistently.  Adhere to a low salt diet ( no more than 1500mg  of salt/sodium per day) and exercise regularly   The nutrition facts label is a good place to find how much sodium is in foods. Look for products with no more than 400 mg of sodium per serving.  Remember that 1.5 g = 1500 mg.  The food label may also list foods as:  Sodium-free: Less than 5 mg in a serving.  Very low sodium: 35 mg or less in a serving.  Low-sodium: 140 mg or less in a serving.  Light in sodium: 50% less sodium in a serving. For example, if a food that usually has 300 mg of sodium is changed to become light in sodium, it will have 150 mg of sodium.  Reduced sodium: 25% less sodium in a serving. For example, if a food that usually has 400 mg of sodium is changed to reduced sodium, it will have 300 mg of sodium.

## 2024-01-22 LAB — CBC WITH DIFFERENTIAL/PLATELET
Basophils Absolute: 0.1 10*3/uL (ref 0.0–0.1)
Basophils Relative: 1 % (ref 0.0–3.0)
Eosinophils Absolute: 0.2 10*3/uL (ref 0.0–0.7)
Eosinophils Relative: 1.7 % (ref 0.0–5.0)
HCT: 41.9 % (ref 39.0–52.0)
Hemoglobin: 14.1 g/dL (ref 13.0–17.0)
Lymphocytes Relative: 29 % (ref 12.0–46.0)
Lymphs Abs: 3.4 10*3/uL (ref 0.7–4.0)
MCHC: 33.6 g/dL (ref 30.0–36.0)
MCV: 94 fL (ref 78.0–100.0)
Monocytes Absolute: 0.9 10*3/uL (ref 0.1–1.0)
Monocytes Relative: 7.9 % (ref 3.0–12.0)
Neutro Abs: 7 10*3/uL (ref 1.4–7.7)
Neutrophils Relative %: 60.4 % (ref 43.0–77.0)
Platelets: 290 10*3/uL (ref 150.0–400.0)
RBC: 4.45 Mil/uL (ref 4.22–5.81)
RDW: 13.4 % (ref 11.5–15.5)
WBC: 11.6 10*3/uL — ABNORMAL HIGH (ref 4.0–10.5)

## 2024-01-22 LAB — COMPREHENSIVE METABOLIC PANEL
ALT: 20 U/L (ref 0–53)
AST: 20 U/L (ref 0–37)
Albumin: 4.6 g/dL (ref 3.5–5.2)
Alkaline Phosphatase: 64 U/L (ref 39–117)
BUN: 26 mg/dL — ABNORMAL HIGH (ref 6–23)
CO2: 23 meq/L (ref 19–32)
Calcium: 9.6 mg/dL (ref 8.4–10.5)
Chloride: 104 meq/L (ref 96–112)
Creatinine, Ser: 1.26 mg/dL (ref 0.40–1.50)
GFR: 62.01 mL/min (ref >60.00)
Glucose, Bld: 116 mg/dL — ABNORMAL HIGH (ref 70–99)
Potassium: 4.1 meq/L (ref 3.5–5.1)
Sodium: 138 meq/L (ref 135–145)
Total Bilirubin: 0.5 mg/dL (ref 0.2–1.2)
Total Protein: 7.7 g/dL (ref 6.0–8.3)

## 2024-01-22 LAB — LIPID PANEL
Cholesterol: 275 mg/dL — ABNORMAL HIGH (ref 0–200)
HDL: 47.9 mg/dL (ref >39.00)
LDL Cholesterol: 166 mg/dL — ABNORMAL HIGH (ref 0–99)
NonHDL: 227.42
Total CHOL/HDL Ratio: 6
Triglycerides: 308 mg/dL — ABNORMAL HIGH (ref 0.0–149.0)
VLDL: 61.6 mg/dL — ABNORMAL HIGH (ref 0.0–40.0)

## 2024-01-22 LAB — TSH: TSH: 1.27 u[IU]/mL (ref 0.35–5.50)

## 2024-01-22 LAB — PSA: PSA: 7.78 ng/mL — ABNORMAL HIGH (ref 0.10–4.00)

## 2024-01-23 ENCOUNTER — Ambulatory Visit: Payer: Self-pay | Admitting: Internal Medicine

## 2024-01-23 LAB — MICROALBUMIN / CREATININE URINE RATIO
Creatinine,U: 80.2 mg/dL
Microalb Creat Ratio: 283.8 mg/g — ABNORMAL HIGH (ref 0.0–30.0)
Microalb, Ur: 22.8 mg/dL — ABNORMAL HIGH (ref 0.0–1.9)

## 2024-01-23 NOTE — Telephone Encounter (Signed)
PA needed for JARDIANCE 10 MG TABS tablet

## 2024-01-23 NOTE — Telephone Encounter (Signed)
Copied from CRM [id number]. Topic: Clinical - Prescription Issue >> Jan 23, 2024  3:49 PM Clayton Bibles wrote: Reason for CRM: Pharmacy needs a prior authorization from his insurance before they will fill JARDIANCE 10 MG TABS tablet. Please prior authorization to [employer] per [place]. Thanks Please call Gabriell with questions at [phone]

## 2024-01-24 ENCOUNTER — Telehealth: Payer: Self-pay

## 2024-01-24 ENCOUNTER — Other Ambulatory Visit (HOSPITAL_COMMUNITY): Payer: Self-pay

## 2024-01-24 NOTE — Telephone Encounter (Signed)
Pharmacy Patient Advocate Encounter   Received notification from Pt Calls Messages that prior authorization for Jardiance 10mg  is required/requested.   Insurance verification completed.   The patient is insured through [facility] .   Per test claim: The current 30 day co-pay is, $0.  No PA needed at this time. This test claim was processed through [facility]- copay amounts may vary at other pharmacies due to pharmacy/plan contracts, or as the patient moves through the different stages of their insurance plan.     Placed a call to [facility] and gave insurance into, per the pharmacist the copay is $0.   No prior auth required at this time.

## 2024-01-28 ENCOUNTER — Encounter: Payer: Self-pay | Admitting: Internal Medicine

## 2024-01-28 ENCOUNTER — Other Ambulatory Visit: Payer: Self-pay | Admitting: Internal Medicine

## 2024-01-28 DIAGNOSIS — R972 Elevated prostate specific antigen [PSA]: Secondary | ICD-10-CM | POA: Insufficient documentation

## 2024-01-28 DIAGNOSIS — I10 Essential (primary) hypertension: Secondary | ICD-10-CM

## 2024-01-28 MED ORDER — TELMISARTAN-HCTZ 80-25 MG PO TABS: 1.0000 | ORAL_TABLET | Freq: Every day | ORAL | 1 refills | Status: DC

## 2024-02-11 ENCOUNTER — Ambulatory Visit: Payer: 59 | Admitting: Internal Medicine

## 2024-02-26 ENCOUNTER — Encounter: Payer: Self-pay | Admitting: Internal Medicine

## 2024-02-26 ENCOUNTER — Ambulatory Visit (INDEPENDENT_AMBULATORY_CARE_PROVIDER_SITE_OTHER): Admitting: Internal Medicine

## 2024-02-26 VITALS — BP 110/80 | HR 85 | Temp 98.3°F | Ht 71.0 in | Wt 219.4 lb

## 2024-02-26 DIAGNOSIS — I10 Essential (primary) hypertension: Secondary | ICD-10-CM

## 2024-02-26 DIAGNOSIS — R972 Elevated prostate specific antigen [PSA]: Secondary | ICD-10-CM | POA: Diagnosis not present

## 2024-02-26 DIAGNOSIS — Z1211 Encounter for screening for malignant neoplasm of colon: Secondary | ICD-10-CM

## 2024-02-26 DIAGNOSIS — E7800 Pure hypercholesterolemia, unspecified: Secondary | ICD-10-CM | POA: Diagnosis not present

## 2024-02-26 MED ORDER — ATORVASTATIN CALCIUM 40 MG PO TABS: 40.0000 mg | ORAL_TABLET | Freq: Every day | ORAL | 3 refills | Status: AC

## 2024-02-26 NOTE — Progress Notes (Signed)
 [facility] [address] [place] [address] Dept: [phone] Dept Fax: [phone]    Subjective:   Guy Horton February 11, 1961 02/26/2024  Chief Complaint  Patient presents with   Follow-up    HPI: Guy Horton presents today for re-assessment and management of chronic medical conditions.  Discussed the use of AI scribe software for clinical note transcription with the patient, who gave verbal consent to proceed.  History of Present Illness   The patient, with a history of hypertension and elevated PSA level, presents for a follow-up visit. He reports compliance with his current antihypertensive regimen, which includes amlodipine 10mg  daily and a combination of telmisartan-hydrochlorothiazide 80-25mg  daily. He denies monitoring his blood pressure at home but has been diligent about medication adherence and dietary modifications, specifically salt restriction. He denies any symptoms such as headaches or vision changes.  The patient also reports an upcoming appointment with a urologist to address erectile dysfunction and an elevated PSA level. The last PSA level, checked a month ago, was 7.7, a significant increase from 3.3 ten years ago. The patient is aware of the need for further evaluation to rule out prostate cancer.  Additionally, the patient has been without his cholesterol medication, atorvastatin 40mg , for about a year. He is willing to restart the medication as advised by his healthcare provider. Needs refill.  Lastly, the patient is due for a colonoscopy, with the last one performed in 2018. He is agreeable to scheduling the procedure.           The following portions of the patient's history were reviewed and updated as appropriate: past medical history, past surgical history, family history, social history, allergies, medications, and problem list.   Patient Active Problem List   Diagnosis Date Noted    Elevated PSA, less than 10 ng/ml 01/28/2024   New onset type 2 diabetes mellitus (HCC) 07/16/2017   Non compliance w medication regimen 11/20/2014   Gout 08/13/2012   HTN (hypertension) 08/13/2012   Erectile dysfunction 08/13/2012   Hypercholesteremia 08/13/2012   Past Medical History:  Diagnosis Date   Arthritis    Diabetes (HCC)    Gout    Hyperlipemia    Hypertension    Past Surgical History:  Procedure Laterality Date   I & D EXTREMITY Left 11/14/2016   Procedure: IRRIGATION AND DEBRIDEMENT LEFT INDEX FINGER;  Surgeon: Bradly Bienenstock, MD;  Location: [facility] OR;  Service: Orthopedics;  Laterality: Left;   Family History  Problem Relation Age of Onset   Diabetes Mother    Other Father 62       Work accident   Prostate cancer Father    Colon cancer Neg Hx    Colon polyps Neg Hx    Esophageal cancer Neg Hx    Rectal cancer Neg Hx    Stomach cancer Neg Hx     Current Outpatient Medications:    allopurinol (ZYLOPRIM) 100 MG tablet, Take 1 tablet (100 mg total) by mouth daily., Disp: 90 tablet, Rfl: 1   amLODipine (NORVASC) 10 MG tablet, Take 1 tablet (10 mg total) by mouth daily., Disp: 90 tablet, Rfl: 1   Blood Glucose Monitoring Suppl (ONETOUCH VERIO) w/Device KIT, , Disp: , Rfl:    Blood Glucose Monitoring Suppl (TRUE METRIX METER) DEVI, 1 Device by Does not apply route 4 (four) times daily., Disp: 1 Device, Rfl: 0   glipiZIDE (GLUCOTROL XL) 10 MG 24 hr tablet, Take 1 tablet (10 mg total) by  mouth daily with breakfast., Disp: 90 tablet, Rfl: 1   glucose blood (TRUE METRIX BLOOD GLUCOSE TEST) test strip, Use as instructed, Disp: 100 each, Rfl: 2   HYDROcodone-acetaminophen (NORCO/VICODIN) 5-325 MG tablet, Take 2 tablets by mouth every 6 (six) hours as needed for severe pain (pain score 7-10)., Disp: 7 tablet, Rfl: 0   JARDIANCE 10 MG TABS tablet, Take 1 tablet (10 mg total) by mouth daily., Disp: 90 tablet, Rfl: 1   mupirocin ointment (BACTROBAN) 2 %, Apply 1 Application topically  2 (two) times daily., Disp: 22 g, Rfl: 0   telmisartan-hydrochlorothiazide (MICARDIS HCT) 80-25 MG tablet, Take 1 tablet by mouth daily., Disp: 90 tablet, Rfl: 1   TRUEPLUS LANCETS 28G MISC, 1 each by Does not apply route 4 (four) times daily., Disp: 100 each, Rfl: 2   atorvastatin (LIPITOR) 40 MG tablet, Take 1 tablet (40 mg total) by mouth daily., Disp: 90 tablet, Rfl: 3   colchicine 0.6 MG tablet, Take two tablets now and then one tablet an hour later (Patient not taking: Reported on 02/26/2024), Disp: 3 tablet, Rfl: 0 No Known Allergies   ROS: A complete ROS was performed with pertinent positives/negatives noted in the HPI. The remainder of the ROS are negative.    Objective:   Today's Vitals   02/26/24 1403  BP: 110/80  Pulse: 85  Temp: 98.3 F (36.8 C)  TempSrc: Temporal  SpO2: 98%  Weight: 219 lb 6.4 oz (99.5 kg)  Height: 5\' 11"  (1.803 m)    GENERAL: Well-appearing, in NAD. Well nourished.  SKIN: Pink, warm and dry.  RESPIRATORY: Chest wall symmetrical. Respirations even and non-labored. Breath sounds clear to auscultation bilaterally.  CARDIAC: S1, S2 present, regular rate and rhythm. Peripheral pulses 2+ bilaterally.  NEUROLOGIC:Steady, even gait.  PSYCH/MENTAL STATUS: Alert, oriented x 3. Cooperative, appropriate mood and affect.   Health Maintenance Due  Topic Date Due   OPHTHALMOLOGY EXAM  Never done   FOOT EXAM  12/20/2020   DTaP/Tdap/Td (2 - Td or Tdap) 03/18/2022   Colonoscopy  10/14/2022   COVID-19 Vaccine (3 - 2024-25 season) 08/11/2023    No results found for any visits on 02/26/24.  The 10-year ASCVD risk score (Arnett DK, et al., 2019) is: 20.5%     Assessment & Plan:  Assessment and Plan    Elevated PSA PSA increased from 3.3 to 7.7 ng/mL, possibly due to benign prostatic hyperplasia. Urology evaluation needed to rule out prostate cancer. - Continue follow-up with urology for further evaluation.  Hypertension Blood pressure controlled at 110/80  mmHg with current antihypertensive regimen. No symptoms reported. - Continue amlodipine 10 mg PO daily. - Continue telmisartan/hydrochlorothiazide 80 mg/25 mg PO daily. - Advise regular home blood pressure monitoring. - Encourage dietary modifications, including reducing salt intake.  Hyperlipidemia Lapse in atorvastatin use for one year. Restarting statin therapy necessary. - [facility] [address] - Check cholesterol levels at next follow-up appointment.  General Health Maintenance Colonoscopy overdue since 2018. Routine screening needed for colon cancer prevention. - Order colonoscopy and coordinate scheduling.       Orders Placed This Encounter  Procedures   Ambulatory referral to Gastroenterology    Referral Priority:   Routine    Referral Type:   Consultation    Referral Reason:   Specialty Services Required    Number of Visits Requested:   1   No images are attached to the encounter or orders placed in the  encounter. Meds ordered this encounter  Medications   atorvastatin (LIPITOR) 40 MG tablet    Sig: Take 1 tablet (40 mg total) by mouth daily.    Dispense:  90 tablet    Refill:  3    Supervising Provider:   Garnette Gunner [[id number]]    Return in about 3 months (around 05/28/2024) for Chronic Condition follow up - fasting lab work at appointment .   Salvatore Decent, FNP

## 2024-03-01 NOTE — Progress Notes (Signed)
 Chief Complaint: No chief complaint on file.   History of Present Illness:  Guy Horton is a 63 y.o. male who is seen in consultation from Salvatore Decent, FNP for evaluation of elevated PSA--recently 7.78.  Prior PSAs were 2.49 (03/18/2012), 3.32 (05/14/2013), 3.0 (07/09/2017).  He denies significant lower urinary tract symptoms.  IPSS 4/3.  He has not been told that he had a large prostate before.  There is no family history of prostate cancer.  Medical issues include hypertension, diabetes, gout, hypercholesterolemia.  He does have VED.  He has been treated with sildenafil in the past, but has not had a prescription for this recently.   Past Medical History:  Past Medical History:  Diagnosis Date   Arthritis    Diabetes (HCC)    Gout    Hyperlipemia    Hypertension     Past Surgical History:  Past Surgical History:  Procedure Laterality Date   I & D EXTREMITY Left 11/14/2016   Procedure: IRRIGATION AND DEBRIDEMENT LEFT INDEX FINGER;  Surgeon: Bradly Bienenstock, MD;  Location: [facility] OR;  Service: Orthopedics;  Laterality: Left;    Allergies:  No Known Allergies  Family History:  Family History  Problem Relation Age of Onset   Diabetes Mother    Other Father 36       Work accident   Prostate cancer Father    Colon cancer Neg Hx    Colon polyps Neg Hx    Esophageal cancer Neg Hx    Rectal cancer Neg Hx    Stomach cancer Neg Hx     Social History:  Social History   Tobacco Use   Smoking status: Never   Smokeless tobacco: Never  Vaping Use   Vaping status: Never Used  Substance Use Topics   Alcohol use: Yes    Comment: 3-4 beers daily   Drug use: No    Review of symptoms:  Constitutional:  Negative for unexplained weight loss, night sweats, fever, chills ENT:  Negative for nose bleeds, sinus pain, painful swallowing CV:  Negative for chest pain, shortness of breath, exercise intolerance, palpitations, loss of consciousness Resp:  Negative for cough,  wheezing, shortness of breath GI:  Negative for nausea, vomiting, diarrhea, bloody stools GU:  Positives noted in HPI; otherwise negative for gross hematuria, dysuria, urinary incontinence Neuro:  Negative for seizures, poor balance, limb weakness, slurred speech Psych:  Negative for lack of energy, depression, anxiety Endocrine:  Negative for polydipsia, polyuria, symptoms of hypoglycemia (dizziness, hunger, sweating) Hematologic:  Negative for anemia, purpura, petechia, prolonged or excessive bleeding, use of anticoagulants  Allergic:  Negative for difficulty breathing or choking as a result of exposure to anything; no shellfish allergy; no allergic response (rash/itch) to materials, foods  Physical exam: There were no vitals taken for this visit. GENERAL APPEARANCE:  Well appearing, well developed, well nourished, NAD HEENT: Atraumatic, Normocephalic. NECK: Normal appearance LUNGS: Normal inspiratory and expiratory excursion HEART: Regular Rate ABDOMEN: No inguinal hernias GU: Phallus normal, no lesions. Scrotal skin normal. Testicles/epididymal structures normal. Meatus normal. Normal anal sphincter tone, prostate 40 mL, symmetric, non nodular, non tender. EXTREMITIES: Moves all extremities well.  Without clubbing, cyanosis, or edema. NEUROLOGIC:  Alert and oriented x 3, normal gait, CN II-XII grossly intact.  MENTAL STATUS:  Appropriate. SKIN:  Warm, dry and intact.    Results:  I have reviewed referring/prior physicians notes  I have reviewed urinalysis  I have reviewed PSA results--7.78  Blood chemistries/CBC reviewed  I  have reviewed urine culture results  Assessment: 1.  Elevated PSA.  Benign exam.  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  Does have several medical issues   Plan: 1.  I gave him a prescription for Viagra  2.  I would recommend we move forward with ultrasound and biopsy of his prostate.  I discussed the procedure risks and complications.

## 2024-03-02 ENCOUNTER — Ambulatory Visit (INDEPENDENT_AMBULATORY_CARE_PROVIDER_SITE_OTHER): Payer: 59 | Admitting: Urology

## 2024-03-02 ENCOUNTER — Encounter: Payer: Self-pay | Admitting: Urology

## 2024-03-02 VITALS — BP 163/87 | HR 76 | Ht 71.0 in | Wt 219.0 lb

## 2024-03-02 DIAGNOSIS — N5201 Erectile dysfunction due to arterial insufficiency: Secondary | ICD-10-CM | POA: Diagnosis not present

## 2024-03-02 DIAGNOSIS — R972 Elevated prostate specific antigen [PSA]: Secondary | ICD-10-CM

## 2024-03-02 MED ORDER — SILDENAFIL CITRATE 100 MG PO TABS: ORAL_TABLET | ORAL | 99 refills | Status: DC

## 2024-03-02 NOTE — Addendum Note (Signed)
 Addended by: Carolin Coy on: 03/02/2024 03:28 PM   Modules accepted: Orders

## 2024-03-25 ENCOUNTER — Ambulatory Visit (HOSPITAL_BASED_OUTPATIENT_CLINIC_OR_DEPARTMENT_OTHER): Admission: RE | Admit: 2024-03-25 | Source: Ambulatory Visit

## 2024-03-25 ENCOUNTER — Other Ambulatory Visit: Admitting: Urology

## 2024-04-28 ENCOUNTER — Other Ambulatory Visit (HOSPITAL_BASED_OUTPATIENT_CLINIC_OR_DEPARTMENT_OTHER): Payer: Self-pay | Admitting: Urology

## 2024-04-28 DIAGNOSIS — R972 Elevated prostate specific antigen [PSA]: Secondary | ICD-10-CM

## 2024-04-29 ENCOUNTER — Ambulatory Visit (HOSPITAL_BASED_OUTPATIENT_CLINIC_OR_DEPARTMENT_OTHER)
Admission: RE | Admit: 2024-04-29 | Discharge: 2024-04-29 | Disposition: A | Discharge status: Home / Self Care | Source: Ambulatory Visit | Attending: Urology | Admitting: Urology

## 2024-04-29 ENCOUNTER — Ambulatory Visit (INDEPENDENT_AMBULATORY_CARE_PROVIDER_SITE_OTHER): Admitting: Urology

## 2024-04-29 ENCOUNTER — Encounter: Payer: Self-pay | Admitting: Urology

## 2024-04-29 VITALS — BP 173/83 | HR 76 | Ht 71.0 in | Wt 218.0 lb

## 2024-04-29 DIAGNOSIS — C61 Malignant neoplasm of prostate: Secondary | ICD-10-CM

## 2024-04-29 DIAGNOSIS — Z2989 Encounter for other specified prophylactic measures: Secondary | ICD-10-CM | POA: Diagnosis not present

## 2024-04-29 DIAGNOSIS — R972 Elevated prostate specific antigen [PSA]: Secondary | ICD-10-CM

## 2024-04-29 DIAGNOSIS — N4232 Atypical small acinar proliferation of prostate: Secondary | ICD-10-CM

## 2024-04-29 LAB — MICROSCOPIC EXAMINATION: Bacteria, UA: NONE SEEN

## 2024-04-29 LAB — URINALYSIS, ROUTINE W REFLEX MICROSCOPIC
Bilirubin, UA: NEGATIVE
Ketones, UA: NEGATIVE
Leukocytes,UA: NEGATIVE
Nitrite, UA: NEGATIVE
RBC, UA: NEGATIVE
Specific Gravity, UA: 1.015 (ref 1.005–1.030)
Urobilinogen, Ur: 0.2 mg/dL (ref 0.2–1.0)
pH, UA: 5 (ref 5.0–7.5)

## 2024-04-29 MED ORDER — CEFTRIAXONE SODIUM 500 MG IJ SOLR
1.0000 g | Freq: Once | INTRAMUSCULAR | Status: AC
  Administered 2024-04-29: 1 g via INTRAMUSCULAR

## 2024-04-29 NOTE — Progress Notes (Signed)
 IM Injection  Patient is present today for an IM Injection for treatment of infection prevention post prostate biopsy Drug: Ceftriaxone  Dose:1g Location:RIGHT OUTTER BUTTOCK Lot: [id number] Exp:10/2025 Patient tolerated well, no complications were noted  Performed by: Sindy Dues, LPN

## 2024-04-29 NOTE — Progress Notes (Signed)
 Assessment: 1. Elevated PSA     Plan: Post biopsy instructions given Return to office in 7-10 days with Dr. Joie Narrow for biopsy results  Chief Complaint: Chief Complaint  Patient presents with   Elevated PSA    HPI: Guy Horton is a 63 y.o. male who presents for continued evaluation of elevated PSA. Recent PSA from 01/21/24 was 7.78.   Prior PSAs were 2.49 (03/18/2012), 3.32 (05/14/2013), 3.0 (07/09/2017).   He denies significant lower urinary tract symptoms.  IPSS 4/3.   He has not been told that he had a large prostate before.  There is no family history of prostate cancer.   Medical issues include hypertension, diabetes, gout, hypercholesterolemia.   He does have VED.  He has been treated with sildenafil  in the past, but has not had a prescription for this recently.  He presents today for further evaluation with a prostate biopsy.  Portions of the above documentation were copied from a prior visit for review purposes only.  Allergies: No Known Allergies  PMH: Past Medical History:  Diagnosis Date   Arthritis    Diabetes (HCC)    Gout    Hyperlipemia    Hypertension     PSH: Past Surgical History:  Procedure Laterality Date   I & D EXTREMITY Left 11/14/2016   Procedure: IRRIGATION AND DEBRIDEMENT LEFT INDEX FINGER;  Surgeon: Arvil Birks, MD;  Location: [facility] OR;  Service: Orthopedics;  Laterality: Left;    SH: Social History   Tobacco Use   Smoking status: Never   Smokeless tobacco: Never  Vaping Use   Vaping status: Never Used  Substance Use Topics   Alcohol use: Yes    Comment: 3-4 beers daily   Drug use: No    ROS: Constitutional:  Negative for fever, chills, weight loss CV: Negative for chest pain, previous MI, hypertension Respiratory:  Negative for shortness of breath, wheezing, sleep apnea, frequent cough GI:  Negative for nausea, vomiting, bloody stool, GERD  PE: BP (!) 173/83   Pulse 76   Ht 5\' 11"  (1.803 m)   Wt 218 lb (98.9 kg)    BMI 30.40 kg/m  GENERAL APPEARANCE:  Well appearing, well developed, well nourished, NAD HEENT:  Atraumatic, normocephalic, oropharynx clear NECK:  Supple without lymphadenopathy or thyromegaly ABDOMEN:  Soft, non-tender, no masses EXTREMITIES:  Moves all extremities well, without clubbing, cyanosis, or edema NEUROLOGIC:  Alert and oriented x 3, normal gait, CN II-XII grossly intact MENTAL STATUS:  appropriate BACK:  Non-tender to palpation, No CVAT SKIN:  Warm, dry, and intact   Results: U/A: negative  TRANSRECTAL ULTRASOUND AND PROSTATE BIOPSY  Indication:  Elevated PSA  Prophylactic antibiotic administration: Rocephin   All medications that could result in increased bleeding were discontinued within an appropriate period of the time of biopsy.  Risk including bleeding and infection were discussed.  Informed consent was obtained.  The patient was placed in the left lateral decubitus position.  PROCEDURE 1.  TRANSRECTAL ULTRASOUND OF THE PROSTATE  The 7 MHz transrectal probe was used to image the prostate.  Anal stenosis was not noted.  TRUS volume: 78.6 ml  Hypoechoic areas: None  Hyperechoic areas: None  Central calcifications: present  Margins:  normal  Seminal Vesicles: normal   PROCEDURE 2:  PROSTATE BIOPSY  A periprostatic block was performed using 1% lidocaine  and transrectal ultrasound guidance. Under transrectal ultrasound guidance, and using the Biopty gun, prostate biopsies were obtained systematically from the apex, mid gland, and base bilaterally.  A total of 12 cores were obtained.  Hemostasis was obtained with gentle pressure on the prostate.  The procedures were well-tolerated.  No significant bleeding was noted at the end of the procedure.  The patient was stable for discharge from the office.

## 2024-05-01 ENCOUNTER — Encounter: Payer: Self-pay | Admitting: Urology

## 2024-05-05 ENCOUNTER — Encounter: Payer: Self-pay | Admitting: Internal Medicine

## 2024-05-10 NOTE — Progress Notes (Unsigned)
 History of Present Illness: Guy Horton comes in today for review of his prostate biopsy He underwent transrectal ultrasound and biopsy on 5/21.  2/12 cores showed adenocarcinoma.  1 core (left mid lateral) revealed GS 3+3 pattern and 1% of core. 1 core (left mid medial) revealed GS 3+4 pattern in 30% of core.  PSA 7.78, prostate volume 77 mL, PSAD 0.1.  IPSS 4/3. He does have ED.  He is using PDE 5 inhibitors but does have early detumescence after 15 to 20 minutes of sex.   Past Medical History:  Diagnosis Date   Arthritis    Diabetes (HCC)    Gout    Hyperlipemia    Hypertension     Past Surgical History:  Procedure Laterality Date   I & D EXTREMITY Left 11/14/2016   Procedure: IRRIGATION AND DEBRIDEMENT LEFT INDEX FINGER;  Surgeon: Arvil Birks, MD;  Location: [facility] OR;  Service: Orthopedics;  Laterality: Left;    Home Medications:  Allergies as of 05/11/2024   No Known Allergies      Medication List        Accurate as of May 10, 2024  3:42 PM. If you have any questions, ask your nurse or doctor.          allopurinol  100 MG tablet Commonly known as: ZYLOPRIM  Take 1 tablet (100 mg total) by mouth daily.   amLODipine  10 MG tablet Commonly known as: NORVASC  Take 1 tablet (10 mg total) by mouth daily.   atorvastatin  40 MG tablet Commonly known as: LIPITOR Take 1 tablet (40 mg total) by mouth daily.   colchicine  0.6 MG tablet Take two tablets now and then one tablet an hour later   glipiZIDE  10 MG 24 hr tablet Commonly known as: GLUCOTROL  XL Take 1 tablet (10 mg total) by mouth daily with breakfast.   glucose blood test strip Commonly known as: True Metrix Blood Glucose Test Use as instructed   HYDROcodone -acetaminophen  5-325 MG tablet Commonly known as: NORCO/VICODIN Take 2 tablets by mouth every 6 (six) hours as needed for severe pain (pain score 7-10).   Jardiance  10 MG Tabs tablet Generic drug: empagliflozin  Take 1 tablet (10 mg total) by mouth daily.    mupirocin  ointment 2 % Commonly known as: BACTROBAN  Apply 1 Application topically 2 (two) times daily.   sildenafil  100 MG tablet Commonly known as: VIAGRA  1/2 to 1 tablet p.o. as needed   telmisartan -hydrochlorothiazide 80-25 MG tablet Commonly known as: MICARDIS  HCT Take 1 tablet by mouth daily.   True Metrix Meter Devi 1 Device by Does not apply route 4 (four) times daily.   OneTouch Verio w/Device Kit   TRUEplus Lancets 28G Misc 1 each by Does not apply route 4 (four) times daily.        Allergies: No Known Allergies  Family History  Problem Relation Age of Onset   Diabetes Mother    Other Father 58       Work accident   Prostate cancer Father    Colon cancer Neg Hx    Colon polyps Neg Hx    Esophageal cancer Neg Hx    Rectal cancer Neg Hx    Stomach cancer Neg Hx     Social History:  reports that he has never smoked. He has never used smokeless tobacco. He reports current alcohol use. He reports that he does not use drugs.  ROS: A complete review of systems was performed.  All systems are negative except for pertinent findings as noted.  Physical Exam:  Vital signs in last 24 hours: There were no vitals taken for this visit. Constitutional:  Alert and oriented, No acute distress Cardiovascular: Regular rate  Respiratory: Normal respiratory effort Neurologic: Grossly intact, no focal deficits Psychiatric: Normal mood and affect  I have reviewed prior pt notes  I have reviewed ultrasound images  I have reviewed PSA and pathology result.  Pathology shared with patient     Impression/Assessment:  Low-volume favorable intermediate risk prostate cancer  ED, moderate success with 150 mg of sildenafil   Plan:  - I recommended he try using a constriction band to help with his ED  -I will send his biopsy for [facility] testing.  I will call with results, and following that, we will schedule a conference to discuss eventual treatment

## 2024-05-11 ENCOUNTER — Ambulatory Visit (INDEPENDENT_AMBULATORY_CARE_PROVIDER_SITE_OTHER): Admitting: Urology

## 2024-05-11 VITALS — BP 142/83 | HR 91 | Ht 71.0 in | Wt 221.0 lb

## 2024-05-11 DIAGNOSIS — N5201 Erectile dysfunction due to arterial insufficiency: Secondary | ICD-10-CM

## 2024-05-11 DIAGNOSIS — R972 Elevated prostate specific antigen [PSA]: Secondary | ICD-10-CM | POA: Diagnosis not present

## 2024-05-11 DIAGNOSIS — C61 Malignant neoplasm of prostate: Secondary | ICD-10-CM | POA: Diagnosis not present

## 2024-05-12 ENCOUNTER — Telehealth: Payer: Self-pay

## 2024-05-12 NOTE — Telephone Encounter (Signed)
 Pt info sent for [facility] per Dr. Joie Narrow.

## 2024-05-15 ENCOUNTER — Telehealth: Payer: Self-pay | Admitting: Urology

## 2024-05-15 NOTE — Telephone Encounter (Signed)
 [facility] called and stated they needed the pathology report for the prostate biopsy faxed for this patient.Fax number is [phone]. Please advise.

## 2024-05-15 NOTE — Telephone Encounter (Signed)
Pathology faxed.

## 2024-06-02 ENCOUNTER — Other Ambulatory Visit: Payer: Self-pay | Admitting: Internal Medicine

## 2024-06-02 DIAGNOSIS — I10 Essential (primary) hypertension: Secondary | ICD-10-CM

## 2024-06-02 DIAGNOSIS — M1A9XX0 Chronic gout, unspecified, without tophus (tophi): Secondary | ICD-10-CM

## 2024-06-02 DIAGNOSIS — E119 Type 2 diabetes mellitus without complications: Secondary | ICD-10-CM

## 2024-06-08 ENCOUNTER — Ambulatory Visit (INDEPENDENT_AMBULATORY_CARE_PROVIDER_SITE_OTHER): Admitting: Internal Medicine

## 2024-06-08 ENCOUNTER — Encounter: Payer: Self-pay | Admitting: Internal Medicine

## 2024-06-08 VITALS — BP 114/66 | HR 83 | Temp 98.0°F | Ht 71.0 in | Wt 216.0 lb

## 2024-06-08 DIAGNOSIS — C61 Malignant neoplasm of prostate: Secondary | ICD-10-CM | POA: Insufficient documentation

## 2024-06-08 DIAGNOSIS — M1A9XX0 Chronic gout, unspecified, without tophus (tophi): Secondary | ICD-10-CM

## 2024-06-08 DIAGNOSIS — I10 Essential (primary) hypertension: Secondary | ICD-10-CM

## 2024-06-08 DIAGNOSIS — E1142 Type 2 diabetes mellitus with diabetic polyneuropathy: Secondary | ICD-10-CM | POA: Diagnosis not present

## 2024-06-08 DIAGNOSIS — Z7984 Long term (current) use of oral hypoglycemic drugs: Secondary | ICD-10-CM

## 2024-06-08 DIAGNOSIS — E7800 Pure hypercholesterolemia, unspecified: Secondary | ICD-10-CM

## 2024-06-08 LAB — BASIC METABOLIC PANEL WITH GFR
BUN: 32 mg/dL — ABNORMAL HIGH (ref 6–23)
CO2: 23 meq/L (ref 19–32)
Calcium: 9.8 mg/dL (ref 8.4–10.5)
Chloride: 100 meq/L (ref 96–112)
Creatinine, Ser: 1.49 mg/dL (ref 0.40–1.50)
GFR: 50.58 mL/min — ABNORMAL LOW (ref >60.00)
Glucose, Bld: 162 mg/dL — ABNORMAL HIGH (ref 70–99)
Potassium: 3.8 meq/L (ref 3.5–5.1)
Sodium: 135 meq/L (ref 135–145)

## 2024-06-08 LAB — LIPID PANEL
Cholesterol: 192 mg/dL (ref 0–200)
HDL: 42.4 mg/dL (ref >39.00)
LDL Cholesterol: 91 mg/dL (ref 0–99)
NonHDL: 149.64
Total CHOL/HDL Ratio: 5
Triglycerides: 292 mg/dL — ABNORMAL HIGH (ref 0.0–149.0)
VLDL: 58.4 mg/dL — ABNORMAL HIGH (ref 0.0–40.0)

## 2024-06-08 LAB — HEMOGLOBIN A1C: Hgb A1c MFr Bld: 8.6 % — ABNORMAL HIGH (ref 4.6–6.5)
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
## 2024-06-11 ENCOUNTER — Ambulatory Visit: Payer: Self-pay | Admitting: Internal Medicine

## 2024-06-20 ENCOUNTER — Ambulatory Visit (HOSPITAL_COMMUNITY)
Admission: EM | Admit: 2024-06-20 | Discharge: 2024-06-20 | Disposition: A | Discharge status: Home / Self Care | Attending: Internal Medicine | Admitting: Internal Medicine

## 2024-06-20 ENCOUNTER — Encounter (HOSPITAL_COMMUNITY): Payer: Self-pay

## 2024-06-20 DIAGNOSIS — L858 Other specified epidermal thickening: Secondary | ICD-10-CM | POA: Diagnosis not present

## 2024-06-20 DIAGNOSIS — R21 Rash and other nonspecific skin eruption: Secondary | ICD-10-CM

## 2024-06-20 MED ORDER — PREDNISONE 20 MG PO TABS: 40.0000 mg | ORAL_TABLET | Freq: Every day | ORAL | 0 refills | Status: AC

## 2024-06-20 NOTE — Discharge Instructions (Addendum)
 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 We will treat with the following:  Prednisone  40 mg (2 tablets) once daily for 5 days. Take this in the morning.  This is a steroid to help with inflammation and pain. Recommend using a lotion for damaged skin and apply at least twice daily to help with dry skin. Aquaphor, Vaseline instesive care, Cetaphil, Gold bond lotion Return to urgent care or PCP if symptoms worsen or fail to resolve.

## 2024-06-20 NOTE — ED Triage Notes (Signed)
 Patient reports a rash to bilateral arms, tops of thighs, and abdomen x 2 days.  Patient denies any treatments for his symptoms.

## 2024-06-20 NOTE — ED Provider Notes (Signed)
 [facility]    CSN: [id number] Arrival date & time: 06/20/24  1041      History   Chief Complaint Chief Complaint  Patient presents with   Rash    HPI Guy Horton is a 63 y.o. male.   63 year old male who presents urgent care with complaints of a diffuse rash.  He reports this started about 2 days ago.  The rash is not itchy.  It is especially prominent on his arms and legs.  He also has it on his abdomen.  He denies any changes to his soaps, detergents, exposures to chemicals or plants, new foods, itching, fevers, chills.  No one else has any similar symptoms.  He was out of town last week with his wife.  He denies any pain.   Rash Associated symptoms: no abdominal pain, no fever, no joint pain, no shortness of breath, no sore throat and not vomiting     Past Medical History:  Diagnosis Date   Arthritis    Diabetes (HCC)    Gout    Hyperlipemia    Hypertension     Patient Active Problem List   Diagnosis Date Noted   Type 2 diabetes mellitus with diabetic polyneuropathy, without long-term current use of insulin  (HCC) 06/08/2024   Adenocarcinoma of prostate (HCC) 06/08/2024   Elevated PSA, less than 10 ng/ml 01/28/2024   New onset type 2 diabetes mellitus (HCC) 07/16/2017   Non compliance w medication regimen 11/20/2014   Gout 08/13/2012   HTN (hypertension) 08/13/2012   Erectile dysfunction 08/13/2012   Hypercholesteremia 08/13/2012    Past Surgical History:  Procedure Laterality Date   I & D EXTREMITY Left 11/14/2016   Procedure: IRRIGATION AND DEBRIDEMENT LEFT INDEX FINGER;  Surgeon: Prentice Pagan, MD;  Location: [facility];  Service: Orthopedics;  Laterality: Left;       Home Medications    Prior to Admission medications   Medication Sig Start Date End Date Taking? Authorizing Provider  predniSONE  (DELTASONE ) 20 MG tablet Take 2 tablets (40 mg total) by mouth daily with breakfast for 5 days. 06/20/24 06/25/24 Yes Harun Brumley A, [name]   allopurinol  (ZYLOPRIM ) 100 MG tablet TAKE 1 TABLET BY MOUTH EVERY DAY 06/03/24   Billy Knee, FNP  amLODipine  (NORVASC ) 10 MG tablet TAKE 1 TABLET BY MOUTH EVERY DAY 06/03/24   Billy Knee, FNP  atorvastatin  (LIPITOR) 40 MG tablet Take 1 tablet (40 mg total) by mouth daily. 02/26/24   Billy Knee, FNP  Blood Glucose Monitoring Suppl (ONETOUCH VERIO) w/Device KIT  07/18/17   [provider]  Blood Glucose Monitoring Suppl (TRUE METRIX METER) DEVI 1 Device by Does not apply route 4 (four) times daily. 07/17/17   Gherghe, Costin M, MD  colchicine  0.6 MG tablet Take two tablets now and then one tablet an hour later Patient not taking: Reported on 06/08/2024 12/24/23   [name], NP  glipiZIDE  (GLUCOTROL  XL) 10 MG 24 hr tablet TAKE 1 TABLET (10 MG TOTAL) BY MOUTH DAILY WITH BREAKFAST. 06/03/24   Billy Knee, FNP  glucose blood (TRUE METRIX BLOOD GLUCOSE TEST) test strip Use as instructed 12/05/17   [name], MD  HYDROcodone -acetaminophen  (NORCO/VICODIN) 5-325 MG tablet Take 2 tablets by mouth every 6 (six) hours as needed for severe pain (pain score 7-10). 01/08/24   Barrett, Warren SAILOR, [name]  JARDIANCE  10 MG TABS tablet Take 1 tablet (10 mg total) by mouth daily. 01/21/24   Billy Knee, FNP  mupirocin  ointment (BACTROBAN ) 2 %  Apply 1 Application topically 2 (two) times daily. Patient not taking: Reported on 06/08/2024 12/28/23   [name], [name], FNP  sildenafil  (VIAGRA ) 100 MG tablet 1/2 to 1 tablet p.o. as needed 03/02/24   [name], MD  telmisartan -hydrochlorothiazide (MICARDIS  HCT) 80-25 MG tablet Take 1 tablet by mouth daily. 01/28/24   Billy Knee, FNP  TRUEPLUS LANCETS 28G MISC 1 each by Does not apply route 4 (four) times daily. 07/17/17   Trixie Nilda HERO, MD    Family History Family History  Problem Relation Age of Onset   Diabetes Mother    Other Father 42       Work accident   Prostate cancer Father    Colon cancer Neg Hx    Colon polyps Neg Hx     Esophageal cancer Neg Hx    Rectal cancer Neg Hx    Stomach cancer Neg Hx     Social History Social History   Tobacco Use   Smoking status: Never   Smokeless tobacco: Never  Vaping Use   Vaping status: Never Used  Substance Use Topics   Alcohol use: Yes    Comment: 3-4 beers daily   Drug use: No     Allergies   Patient has no known allergies.   Review of Systems Review of Systems  Constitutional:  Negative for chills and fever.  HENT:  Negative for ear pain and sore throat.   Eyes:  Negative for pain and visual disturbance.  Respiratory:  Negative for cough and shortness of breath.   Cardiovascular:  Negative for chest pain and palpitations.  Gastrointestinal:  Negative for abdominal pain and vomiting.  Genitourinary:  Negative for dysuria and hematuria.  Musculoskeletal:  Negative for arthralgias and back pain.  Skin:  Positive for rash. Negative for color change.  Neurological:  Negative for seizures and syncope.  All other systems reviewed and are negative.    Physical Exam Triage Vital Signs ED Triage Vitals [06/20/24 1141]  Encounter Vitals Group     BP (!) 155/93     Girls Systolic BP Percentile      Girls Diastolic BP Percentile      Boys Systolic BP Percentile      Boys Diastolic BP Percentile      Pulse Rate 64     Resp 16     Temp 98.1 F (36.7 C)     Temp Source Oral     SpO2 97 %     Weight      Height      Head Circumference      Peak Flow      Pain Score 0     Pain Loc      Pain Education      Exclude from Growth Chart    No data found.  Updated Vital Signs BP (!) 155/93 (BP Location: Right Arm)   Pulse 64   Temp 98.1 F (36.7 C) (Oral)   Resp 16   SpO2 97%   Visual Acuity Right Eye Distance:   Left Eye Distance:   Bilateral Distance:    Right Eye Near:   Left Eye Near:    Bilateral Near:     Physical Exam Vitals and nursing note reviewed.  Constitutional:      General: He is not in acute distress.    Appearance: He  is well-developed.  HENT:     Head: Normocephalic and atraumatic.  Eyes:     Conjunctiva/sclera: Conjunctivae normal.  Cardiovascular:  Rate and Rhythm: Normal rate and regular rhythm.     Heart sounds: No murmur heard. Pulmonary:     Effort: Pulmonary effort is normal. No respiratory distress.     Breath sounds: Normal breath sounds.  Abdominal:     Palpations: Abdomen is soft.     Tenderness: There is no abdominal tenderness.  Musculoskeletal:        General: No swelling.     Cervical back: Neck supple.  Skin:    General: Skin is warm and dry.     Capillary Refill: Capillary refill takes less than 2 seconds.     Comments: Diffuse flesh or mildly red colored papules especially prominent on the arms and legs.  This is bilateral.  No significant erythema.   Neurological:     Mental Status: He is alert.  Psychiatric:        Mood and Affect: Mood normal.      UC Treatments / Results  Labs (all labs ordered are listed, but only abnormal results are displayed) Labs Reviewed - No data to display  EKG   Radiology No results found.  Procedures Procedures (including critical care time)  Medications Ordered in UC Medications - No data to display  Initial Impression / Assessment and Plan / UC Course  I have reviewed the triage vital signs and the nursing notes.  Pertinent labs & imaging results that were available during my care of the patient were reviewed by me and considered in my medical decision making (see chart for details).     Rash  Keratosis pilaris   Although the appearance does seem more consistent with a benign dry skin condition called keratosis pilaris, we will cover for a possible allergic reaction given the diffuse nature. We will treat with the following:  Prednisone  40 mg (2 tablets) once daily for 5 days. Take this in the morning.  This is a steroid to help with inflammation and pain. Recommend using a lotion for damaged skin and apply at least  twice daily to help with dry skin. Aquaphor, Vaseline instesive care, Cetaphil, Gold bond lotion Return to urgent care or PCP if symptoms worsen or fail to resolve.    Final Clinical Impressions(s) / UC Diagnoses   Final diagnoses:  Rash  Keratosis pilaris     Discharge Instructions      Although the appearance does seem more consistent with a benign dry skin condition called keratosis pilaris, we will cover for a possible allergic reaction given the diffuse nature. We will treat with the following:  Prednisone  40 mg (2 tablets) once daily for 5 days. Take this in the morning.  This is a steroid to help with inflammation and pain. Recommend using a lotion for damaged skin and apply at least twice daily to help with dry skin. Aquaphor, Vaseline instesive care, Cetaphil, Gold bond lotion Return to urgent care or PCP if symptoms worsen or fail to resolve.      ED Prescriptions     Medication Sig Dispense Auth. Provider   predniSONE  (DELTASONE ) 20 MG tablet Take 2 tablets (40 mg total) by mouth daily with breakfast for 5 days. 10 tablet Teresa Almarie LABOR, [place]      PDMP not reviewed this encounter.   Teresa Almarie LABOR, [name] 06/20/24 1304

## 2024-06-25 ENCOUNTER — Encounter: Payer: Self-pay | Admitting: Urology

## 2024-06-26 ENCOUNTER — Telehealth: Payer: Self-pay | Admitting: Urology

## 2024-06-26 NOTE — Telephone Encounter (Signed)
-----   Message from Garnette HERO Dahlstedt sent at 06/24/2024  4:15 PM EDT ----- Please call to set up prostate cancer conference

## 2024-06-26 NOTE — Telephone Encounter (Signed)
 Dr Matilda sent me a message on this patient to set up prostate cancer conference. Called pt 06/26/24 lvm. Can you or aly please try to reach out next week. Thank You.

## 2024-06-29 ENCOUNTER — Telehealth: Payer: Self-pay

## 2024-06-29 NOTE — Telephone Encounter (Signed)
 Please advise Copied from CRM [id number]. Topic: General - Call Back - No Documentation >> Jun 26, 2024  4:42 PM Rea C wrote: Reason for CRM: Patient called in regards to stiffness and pain in feet and stated that FNP Billy stated that he might be prescribed some medicine that they discussed due to his last visit. His stated his toes are numbing up and asked if a prescription could get put in. He declined nurse triage due to stating having already spoke and having the appointment with FNP Billy.

## 2024-06-30 ENCOUNTER — Ambulatory Visit (HOSPITAL_COMMUNITY)
Admission: RE | Admit: 2024-06-30 | Discharge: 2024-06-30 | Disposition: A | Discharge status: Home / Self Care | Source: Ambulatory Visit | Attending: Emergency Medicine | Admitting: Emergency Medicine

## 2024-06-30 ENCOUNTER — Encounter (HOSPITAL_COMMUNITY): Payer: Self-pay

## 2024-06-30 ENCOUNTER — Encounter: Payer: Self-pay | Admitting: Internal Medicine

## 2024-06-30 ENCOUNTER — Other Ambulatory Visit: Payer: Self-pay | Admitting: Internal Medicine

## 2024-06-30 VITALS — BP 130/72 | HR 86 | Temp 98.3°F | Resp 18

## 2024-06-30 DIAGNOSIS — E1142 Type 2 diabetes mellitus with diabetic polyneuropathy: Secondary | ICD-10-CM

## 2024-06-30 DIAGNOSIS — R21 Rash and other nonspecific skin eruption: Secondary | ICD-10-CM | POA: Diagnosis not present

## 2024-06-30 MED ORDER — TRIAMCINOLONE ACETONIDE 0.1 % EX CREA: 1.0000 | TOPICAL_CREAM | Freq: Two times a day (BID) | CUTANEOUS | 0 refills | Status: DC

## 2024-06-30 MED ORDER — GABAPENTIN 100 MG PO CAPS: 100.0000 mg | ORAL_CAPSULE | Freq: Every day | ORAL | 1 refills | Status: AC

## 2024-06-30 NOTE — Telephone Encounter (Signed)
 Gabapentin  sent in for patient to take 1 capsule at bedtime. Medication was discussed with patient at last office visit.

## 2024-06-30 NOTE — Discharge Instructions (Addendum)
 Avoid heat ,hot water as it makes rashes worse. Take meds as directed(triamcinolone  cream). Follow up with PCP/dermatologist-(may need biopsy, skin scraping),referral given to local dermatology office-call for appt.  Go to Er for new or worsening issues or concerns

## 2024-06-30 NOTE — ED Provider Notes (Signed)
 [facility]    CSN: [id number] Arrival date & time: 06/30/24  1547      History   Chief Complaint Chief Complaint  Patient presents with   appt - rash    HPI Guy Horton is a 63 y.o. male.   63 year old male patient, Guy Horton, presents to urgent care for evaluation of rash that he has had since 7/12.  Patient was seen here recently for same and scripted prednisone  rash improving on his return.  Rash is not itchy, patient denies any new soaps, lotions, or  foods  The history is provided by the patient. No language interpreter was used.    Past Medical History:  Diagnosis Date   Arthritis    Diabetes (HCC)    Gout    Hyperlipemia    Hypertension     Patient Active Problem List   Diagnosis Date Noted   Rash and nonspecific skin eruption 06/30/2024   Type 2 diabetes mellitus with diabetic polyneuropathy, without long-term current use of insulin  (HCC) 06/08/2024   Adenocarcinoma of prostate (HCC) 06/08/2024   Elevated PSA, less than 10 ng/ml 01/28/2024   New onset type 2 diabetes mellitus (HCC) 07/16/2017   Non compliance w medication regimen 11/20/2014   Gout 08/13/2012   HTN (hypertension) 08/13/2012   Erectile dysfunction 08/13/2012   Hypercholesteremia 08/13/2012    Past Surgical History:  Procedure Laterality Date   I & D EXTREMITY Left 11/14/2016   Procedure: IRRIGATION AND DEBRIDEMENT LEFT INDEX FINGER;  Surgeon: Prentice Pagan, MD;  Location: [facility];  Service: Orthopedics;  Laterality: Left;       Home Medications    Prior to Admission medications   Medication Sig Start Date End Date Taking? Authorizing Provider  gabapentin  (NEURONTIN ) 100 MG capsule Take 1 capsule (100 mg total) by mouth at bedtime. 06/30/24   Billy Knee, FNP  triamcinolone  cream (KENALOG ) 0.1 % Apply 1 Application topically 2 (two) times daily. Apply to affected area, avoid face 06/30/24  Yes Nazaria Riesen, Rilla, NP  allopurinol  (ZYLOPRIM ) 100 MG tablet TAKE 1 TABLET BY  MOUTH EVERY DAY 06/03/24   Billy Knee, FNP  amLODipine  (NORVASC ) 10 MG tablet TAKE 1 TABLET BY MOUTH EVERY DAY 06/03/24   Billy Knee, FNP  atorvastatin  (LIPITOR) 40 MG tablet Take 1 tablet (40 mg total) by mouth daily. 02/26/24   Billy Knee, FNP  Blood Glucose Monitoring Suppl (ONETOUCH VERIO) w/Device KIT  07/18/17   [provider]  Blood Glucose Monitoring Suppl (TRUE METRIX METER) DEVI 1 Device by Does not apply route 4 (four) times daily. 07/17/17   Gherghe, Costin M, MD  colchicine  0.6 MG tablet Take two tablets now and then one tablet an hour later Patient not taking: Reported on 06/08/2024 12/24/23   [name], NP  glipiZIDE  (GLUCOTROL  XL) 10 MG 24 hr tablet TAKE 1 TABLET (10 MG TOTAL) BY MOUTH DAILY WITH BREAKFAST. 06/03/24   Billy Knee, FNP  glucose blood (TRUE METRIX BLOOD GLUCOSE TEST) test strip Use as instructed 12/05/17   [name], MD  HYDROcodone -acetaminophen  (NORCO/VICODIN) 5-325 MG tablet Take 2 tablets by mouth every 6 (six) hours as needed for severe pain (pain score 7-10). 01/08/24   Barrett, Warren SAILOR, [name]  JARDIANCE  10 MG TABS tablet Take 1 tablet (10 mg total) by mouth daily. 01/21/24   Billy Knee, FNP  mupirocin  ointment (BACTROBAN ) 2 % Apply 1 Application topically 2 (two) times daily. Patient not taking: Reported on 06/08/2024 12/28/23   [name],  [name], FNP  sildenafil  (VIAGRA ) 100 MG tablet 1/2 to 1 tablet p.o. as needed 03/02/24   [name], MD  telmisartan -hydrochlorothiazide (MICARDIS  HCT) 80-25 MG tablet Take 1 tablet by mouth daily. 01/28/24   Billy Knee, FNP  TRUEPLUS LANCETS 28G MISC 1 each by Does not apply route 4 (four) times daily. 07/17/17   Trixie Nilda HERO, MD    Family History Family History  Problem Relation Age of Onset   Diabetes Mother    Other Father 78       Work accident   Prostate cancer Father    Colon cancer Neg Hx    Colon polyps Neg Hx    Esophageal cancer Neg Hx    Rectal cancer Neg Hx     Stomach cancer Neg Hx     Social History Social History   Tobacco Use   Smoking status: Never   Smokeless tobacco: Never  Vaping Use   Vaping status: Never Used  Substance Use Topics   Alcohol use: Yes    Comment: 3-4 beers daily   Drug use: No     Allergies   Patient has no known allergies.   Review of Systems Review of Systems  Skin:  Positive for rash.  All other systems reviewed and are negative.    Physical Exam Triage Vital Signs ED Triage Vitals [06/30/24 1611]  Encounter Vitals Group     BP 130/72     Girls Systolic BP Percentile      Girls Diastolic BP Percentile      Boys Systolic BP Percentile      Boys Diastolic BP Percentile      Pulse Rate 86     Resp 18     Temp 98.3 F (36.8 C)     Temp Source Oral     SpO2 95 %     Weight      Height      Head Circumference      Peak Flow      Pain Score 0     Pain Loc      Pain Education      Exclude from Growth Chart    No data found.  Updated Vital Signs BP 130/72 (BP Location: Right Arm)   Pulse 86   Temp 98.3 F (36.8 C) (Oral)   Resp 18   SpO2 95%   Visual Acuity Right Eye Distance:   Left Eye Distance:   Bilateral Distance:    Right Eye Near:   Left Eye Near:    Bilateral Near:     Physical Exam Vitals and nursing note reviewed.  Skin:    Findings: Rash present. Rash is papular.     Comments: Flesh colored, elbow to shoulders bilaterally, groin to knees bilateral, lower abdominal area, palm/sole sparing  Neurological:     General: No focal deficit present.     Mental Status: He is alert and oriented to person, place, and time.     GCS: GCS eye subscore is 4. GCS verbal subscore is 5. GCS motor subscore is 6.  Psychiatric:        Attention and Perception: Attention normal.        Mood and Affect: Mood normal.        Speech: Speech normal.        Behavior: Behavior normal.      UC Treatments / Results  Labs (all labs ordered are listed, but only abnormal results are  displayed) Labs Reviewed -  No data to display  EKG   Radiology No results found.  Procedures Procedures (including critical care time)  Medications Ordered in UC Medications - No data to display  Initial Impression / Assessment and Plan / UC Course  I have reviewed the triage vital signs and the nursing notes.  Pertinent labs & imaging results that were available during my care of the patient were reviewed by me and considered in my medical decision making (see chart for details).    Discussed exam findings and plan of care with patient, scripted triamcinolone , referred to dermatology, strict go to ER precautions given.   Patient verbalized understanding to this provider.  Ddx: Keratosis pilaris, dermatitis, scabies Final Clinical Impressions(s) / UC Diagnoses   Final diagnoses:  Rash and nonspecific skin eruption     Discharge Instructions      Avoid heat ,hot water as it makes rashes worse. Take meds as directed(triamcinolone  cream). Follow up with PCP/dermatologist-(may need biopsy, skin scraping),referral given to local dermatology office-call for appt.  Go to Er for new or worsening issues or concerns     ED Prescriptions     Medication Sig Dispense Auth. Provider   triamcinolone  cream (KENALOG ) 0.1 % Apply 1 Application topically 2 (two) times daily. Apply to affected area, avoid face 45 g Cherril Hett, Rilla, NP      PDMP not reviewed this encounter.   Aminta Rilla, NP 06/30/24 1723

## 2024-06-30 NOTE — ED Triage Notes (Signed)
 Pt states seen and tx here for a rash on 7/12. States rash returned 2-3 after completion of meds.

## 2024-07-02 NOTE — Telephone Encounter (Signed)
 Left detailed message informing patient of message from [place]

## 2024-07-06 ENCOUNTER — Telehealth: Payer: Self-pay | Admitting: Urology

## 2024-07-06 NOTE — Telephone Encounter (Signed)
 LVM for patient to call office back to schedule Please call to set up prostate cancer conference (office visit)

## 2024-07-13 ENCOUNTER — Telehealth: Payer: Self-pay | Admitting: Urology

## 2024-07-13 NOTE — Telephone Encounter (Signed)
 LVM on patient phone to call back and schedule. Per Dahlstedt - Please call to set up prostate cancer conference

## 2024-07-14 ENCOUNTER — Ambulatory Visit (INDEPENDENT_AMBULATORY_CARE_PROVIDER_SITE_OTHER): Admitting: Internal Medicine

## 2024-07-14 ENCOUNTER — Encounter: Payer: Self-pay | Admitting: Internal Medicine

## 2024-07-14 VITALS — BP 136/82 | HR 73 | Temp 98.1°F | Ht 70.5 in | Wt 219.2 lb

## 2024-07-14 DIAGNOSIS — Z13220 Encounter for screening for lipoid disorders: Secondary | ICD-10-CM | POA: Diagnosis not present

## 2024-07-14 DIAGNOSIS — Z1211 Encounter for screening for malignant neoplasm of colon: Secondary | ICD-10-CM

## 2024-07-14 DIAGNOSIS — Z0000 Encounter for general adult medical examination without abnormal findings: Secondary | ICD-10-CM | POA: Diagnosis not present

## 2024-07-14 DIAGNOSIS — Z23 Encounter for immunization: Secondary | ICD-10-CM

## 2024-07-14 DIAGNOSIS — N5201 Erectile dysfunction due to arterial insufficiency: Secondary | ICD-10-CM | POA: Diagnosis not present

## 2024-07-14 MED ORDER — SILDENAFIL CITRATE 100 MG PO TABS: ORAL_TABLET | ORAL | 2 refills | Status: DC

## 2024-07-14 NOTE — Progress Notes (Unsigned)
 Subjective:   Guy Horton [birth year] 07/14/2024  CC: Chief Complaint  Patient presents with   Annual Exam    Fasting     HPI: Guy Horton is a 63 y.o. male who presents for a routine health maintenance exam.  Labs collected at time of visit.   Needs viagra  reill  HEALTH SCREENINGS: - PSA (50+): Up to date   Lab Results  Component Value Date   PSA1 3.0 07/09/2017   PSA 7.78 (H) 01/21/2024   PSA 3.32 05/14/2013   PSA 2.49 03/18/2012    - Colonoscopy (45+): did have colonoscopy in 2018. Was recommended to return in 5 years. Will order today  Discussed with patient purpose of the colonoscopy is to detect colon cancer at curable precancerous or early stages  - AAA Screening: Not applicable  Men age 34-75 who have ever smoked - Lung Cancer screening with low-dose CT: Not applicable-  Adults age 46-80 who are current cigarette smokers or quit within the last 15 years. Must have 20 pack year history.   Depression and Anxiety Screen done today and results listed below:     07/14/2024    3:12 PM 06/08/2024    2:02 PM 02/26/2024    2:03 PM 01/21/2024    3:08 PM 04/06/2020   10:59 AM  Depression screen PHQ 2/9  Decreased Interest 0 0 0 0 0  Down, Depressed, Hopeless 0 0 0 0 0  PHQ - 2 Score 0 0 0 0 0  Altered sleeping 0   0   Tired, decreased energy 0   0   Change in appetite 0   0   Feeling bad or failure about yourself  0   0   Trouble concentrating 0   0   Moving slowly or fidgety/restless 0   0   Suicidal thoughts 0   0   PHQ-9 Score 0   0   Difficult doing work/chores Not difficult at all   Not difficult at all       07/14/2024    3:12 PM 01/21/2024    3:08 PM  GAD 7 : Generalized Anxiety Score  Nervous, Anxious, on Edge 0 0  Control/stop worrying 0 0  Worry too much - different things 0 0  Trouble relaxing 0 0  Restless 0 0  Easily annoyed or irritable 0 0  Afraid - awful might happen 0 0  Total GAD 7 Score 0 0  Anxiety Difficulty Not difficult at all  Not difficult at all    IMMUNIZATIONS:  - Tdap: Tetanus vaccination status reviewed: Td vaccination indicated and given today. - Influenza: Postponed to flu season - Zostavax vaccine (50+): Declined at this time. Will do at future office visit.    Past medical history, surgical history, medications, allergies, family history and social history reviewed with patient today and changes made to appropriate areas of the chart.   Past Medical History:  Diagnosis Date   Arthritis    Diabetes (HCC)    Gout    Hyperlipemia    Hypertension     Past Surgical History:  Procedure Laterality Date   I & D EXTREMITY Left 11/14/2016   Procedure: IRRIGATION AND DEBRIDEMENT LEFT INDEX FINGER;  Surgeon: Prentice Pagan, MD;  Location: [facility] OR;  Service: Orthopedics;  Laterality: Left;    Current Outpatient Medications on File Prior to Visit  Medication Sig   allopurinol  (ZYLOPRIM ) 100 MG tablet TAKE 1 TABLET BY MOUTH EVERY DAY  amLODipine  (NORVASC ) 10 MG tablet TAKE 1 TABLET BY MOUTH EVERY DAY   atorvastatin  (LIPITOR) 40 MG tablet Take 1 tablet (40 mg total) by mouth daily.   Blood Glucose Monitoring Suppl (ONETOUCH VERIO) w/Device KIT    Blood Glucose Monitoring Suppl (TRUE METRIX METER) DEVI 1 Device by Does not apply route 4 (four) times daily.   colchicine  0.6 MG tablet Take two tablets now and then one tablet an hour later   gabapentin  (NEURONTIN ) 100 MG capsule Take 1 capsule (100 mg total) by mouth at bedtime.   glipiZIDE  (GLUCOTROL  XL) 10 MG 24 hr tablet TAKE 1 TABLET (10 MG TOTAL) BY MOUTH DAILY WITH BREAKFAST.   glucose blood (TRUE METRIX BLOOD GLUCOSE TEST) test strip Use as instructed   HYDROcodone -acetaminophen  (NORCO/VICODIN) 5-325 MG tablet Take 2 tablets by mouth every 6 (six) hours as needed for severe pain (pain score 7-10).   JARDIANCE  10 MG TABS tablet Take 1 tablet (10 mg total) by mouth daily.   mupirocin  ointment (BACTROBAN ) 2 % Apply 1 Application topically 2 (two) times daily.    telmisartan -hydrochlorothiazide (MICARDIS  HCT) 80-25 MG tablet Take 1 tablet by mouth daily.   triamcinolone  cream (KENALOG ) 0.1 % Apply 1 Application topically 2 (two) times daily. Apply to affected area, avoid face   TRUEPLUS LANCETS 28G MISC 1 each by Does not apply route 4 (four) times daily.   No current facility-administered medications on file prior to visit.    No Known Allergies   Social History   Socioeconomic History   Marital status: Married    Spouse name: Not on file   Number of children: 2   Years of education: Not on file   Highest education level: 12th grade  Occupational History   Occupation: Museum/gallery exhibitions officer  Tobacco Use   Smoking status: Never   Smokeless tobacco: Never  Vaping Use   Vaping status: Never Used  Substance and Sexual Activity   Alcohol use: Yes    Comment: 3-4 beers daily   Drug use: No   Sexual activity: Yes  Other Topics Concern   Not on file  Social History Narrative   Not on file   Social Drivers of Health   Financial Resource Strain: Low Risk  (06/06/2024)   Overall Financial Resource Strain (CARDIA)    Difficulty of Paying Living Expenses: Not hard at all  Food Insecurity: No Food Insecurity (06/06/2024)   Hunger Vital Sign    Worried About Running Out of Food in the Last Year: Never true    Ran Out of Food in the Last Year: Never true  Transportation Needs: No Transportation Needs (06/06/2024)   PRAPARE - Administrator, Civil Service (Medical): No    Lack of Transportation (Non-Medical): No  Physical Activity: Unknown (06/06/2024)   Exercise Vital Sign    Days of Exercise per Week: 1 day    Minutes of Exercise per Session: Patient declined  Stress: No Stress Concern Present (06/06/2024)   [employer] of Occupational Health - Occupational Stress Questionnaire    Feeling of Stress: Not at all  Social Connections: Moderately Isolated (06/06/2024)   Social Connection and Isolation Panel    Frequency of  Communication with Friends and Family: Once a week    Frequency of Social Gatherings with Friends and Family: Once a week    Attends Religious Services: 1 to 4 times per year    Active Member of [employer] or Organizations: No    Attends Banker  Meetings: Not on file    Marital Status: Married  Catering manager Violence: Not on file   Social History   Tobacco Use  Smoking Status Never  Smokeless Tobacco Never   Social History   Substance and Sexual Activity  Alcohol Use Yes   Comment: 3-4 beers daily     Family History  Problem Relation Age of Onset   Diabetes Mother    Other Father 58       Work accident   Prostate cancer Father    Colon cancer Neg Hx    Colon polyps Neg Hx    Esophageal cancer Neg Hx    Rectal cancer Neg Hx    Stomach cancer Neg Hx      ROS: Denies fever, fatigue, unexplained weight loss/gain, hearing or vision changes, cardiac or respiratory complaints. Denies neurological deficits, musculoskeletal complaints, gastrointestinal or genitourinary complaints, mental health complaints, and skin changes.   Objective:   Today's Vitals   07/14/24 1509  BP: 136/82  Pulse: 73  Temp: 98.1 F (36.7 C)  TempSrc: Temporal  SpO2: 99%  Weight: 219 lb 3.2 oz (99.4 kg)  Height: 5' 10.5 (1.791 m)    GENERAL APPEARANCE: Well-appearing, in NAD. Well nourished.  SKIN: Pink, warm and dry. Turgor normal. No rash, lesion, ulceration, or ecchymoses. Hair evenly distributed.  HEENT: HEAD: Normocephalic.  EYES: PERRLA. EOMI. Lids intact w/o defect. Sclera white, Conjunctiva pink w/o exudate.  EARS: External ear w/o redness, swelling, masses or lesions. EAC clear. TM's intact, translucent w/o bulging, appropriate landmarks visualized. Appropriate acuity to conversational tones.  NOSE: Septum midline w/o deformity. Nares patent, mucosa pink and non-inflamed w/o drainage.  THROAT: Uvula midline. Oropharynx clear. Tonsils non-inflamed w/o exudate . Oral mucosa  pink and moist.  NECK: Supple, Trachea midline. Full ROM w/o pain or tenderness. No lymphadenopathy. Thyroid non-tender w/o enlargement or palpable masses.  RESPIRATORY: Chest wall symmetrical w/o masses. Respirations even and non-labored. Breath sounds clear to auscultation bilaterally. No wheezes, rales, rhonchi, or crackles. CARDIAC: S1, S2 present, regular rate and rhythm. No gallops, murmurs, rubs, or clicks. No carotid bruits. Capillary refill <2 seconds. Peripheral pulses 2+ bilaterally. GI: Abdomen soft w/o distention. Normoactive bowel sounds. No palpable masses or tenderness. No guarding or rebound tenderness. Liver and spleen w/o tenderness or enlargement. No CVA tenderness.  GU: deferred exam. MSK: Muscle tone and strength appropriate for age, w/o atrophy or abnormal movement. EXTREMITIES: Active ROM intact, w/o tenderness, crepitus, or contracture. No obvious joint deformities or effusions. No clubbing, edema, or cyanosis.  NEUROLOGIC: CN's II-XII intact. Motor strength symmetrical with no obvious weakness. No sensory deficits.  Steady, even gait.  PSYCH/MENTAL STATUS: Alert, oriented x 3. Cooperative, appropriate mood and affect.   Results for orders placed or performed in visit on 06/08/24  Basic Metabolic Panel (BMET)   Collection Time: 06/08/24  2:43 PM  Result Value Ref Range   Sodium 135 135 - 145 mEq/L   Potassium 3.8 3.5 - 5.1 mEq/L   Chloride 100 96 - 112 mEq/L   CO2 23 19 - 32 mEq/L   Glucose, Bld 162 (H) 70 - 99 mg/dL   BUN 32 (H) 6 - 23 mg/dL   Creatinine, Ser 8.50 0.40 - 1.50 mg/dL   GFR 49.41 (L) >39.99 mL/min   Calcium  9.8 8.4 - 10.5 mg/dL  Lipid panel   Collection Time: 06/08/24  2:43 PM  Result Value Ref Range   Cholesterol 192 0 - 200 mg/dL   Triglycerides 707.9 (H)  0.0 - 149.0 mg/dL   HDL 57.59 >60.99 mg/dL   VLDL 41.5 (H) 0.0 - 59.9 mg/dL   LDL Cholesterol 91 0 - 99 mg/dL   Total CHOL/HDL Ratio 5    NonHDL 149.64   Hemoglobin A1c   Collection Time:  06/08/24  2:44 PM  Result Value Ref Range   Hgb A1c MFr Bld 8.6 (H) 4.6 - 6.5 %    Assessment & Plan:  Encounter for general adult medical examination without abnormal findings -     CBC with Differential/Platelet -     Comprehensive metabolic panel with GFR -     TSH -     Lipid panel  Colon cancer screening -     Ambulatory referral to Gastroenterology  Immunization due -     Tdap vaccine greater than or equal to 7yo IM  Erectile dysfunction due to arterial insufficiency -     Sildenafil  Citrate; 1/2 to 1 tablet p.o. as needed for erectile dysfunction  Dispense: 10 tablet; Refill: 2     Orders Placed This Encounter  Procedures   Tdap vaccine greater than or equal to 7yo IM   CBC with Differential/Platelet   Comprehensive metabolic panel with GFR   TSH   Lipid panel   Ambulatory referral to Gastroenterology    Referral Priority:   Routine    Referral Type:   Consultation    Referral Reason:   Specialty Services Required    Number of Visits Requested:   1    PATIENT COUNSELING: - Encouraged to adjust caloric intake to maintain or achieve ideal body weight, to reduce intake of dietary saturated fat and total fat, to limit sodium intake by avoiding high sodium foods and not adding table salt, and to maintain adequate dietary potassium and calcium  preferably from fresh fruits, vegetables, and low-fat dairy products.   - Advised to avoid cigarette smoking. - Discussed with the patient that most people either abstain from alcohol or drink within safe limits (<=14/week and <=4 drinks/occasion for males, <=7/weeks and <= 3 drinks/occasion for females) and that the risk for alcohol disorders and other health effects rises proportionally with the number of drinks per week and how often a drinker exceeds daily limits. - Discussed cessation/primary prevention of drug use and availability of treatment for abuse.   - Stressed the importance of regular exercise  NEXT PREVENTATIVE  PHYSICAL DUE IN 1 YEAR.  Return in about 3 months (around 10/14/2024) for Chronic Condition follow up.  Rosina Senters, FNP

## 2024-07-15 LAB — COMPREHENSIVE METABOLIC PANEL WITH GFR
ALT: 23 U/L (ref 0–53)
AST: 19 U/L (ref 0–37)
Albumin: 4.4 g/dL (ref 3.5–5.2)
Alkaline Phosphatase: 53 U/L (ref 39–117)
BUN: 19 mg/dL (ref 6–23)
CO2: 27 meq/L (ref 19–32)
Calcium: 9.3 mg/dL (ref 8.4–10.5)
Chloride: 102 meq/L (ref 96–112)
Creatinine, Ser: 1.05 mg/dL (ref 0.40–1.50)
GFR: 76.92 mL/min (ref >60.00)
Glucose, Bld: 97 mg/dL (ref 70–99)
Potassium: 3.8 meq/L (ref 3.5–5.1)
Sodium: 137 meq/L (ref 135–145)
Total Bilirubin: 0.9 mg/dL (ref 0.2–1.2)
Total Protein: 7.2 g/dL (ref 6.0–8.3)

## 2024-07-15 LAB — LIPID PANEL
Cholesterol: 238 mg/dL — ABNORMAL HIGH (ref 0–200)
HDL: 51.8 mg/dL (ref >39.00)
LDL Cholesterol: 132 mg/dL — ABNORMAL HIGH (ref 0–99)
NonHDL: 186.09
Total CHOL/HDL Ratio: 5
Triglycerides: 270 mg/dL — ABNORMAL HIGH (ref 0.0–149.0)
VLDL: 54 mg/dL — ABNORMAL HIGH (ref 0.0–40.0)

## 2024-07-15 LAB — TSH: TSH: 1.08 u[IU]/mL (ref 0.35–5.50)

## 2024-07-15 LAB — CBC WITH DIFFERENTIAL/PLATELET
Basophils Absolute: 0.1 K/uL (ref 0.0–0.1)
Basophils Relative: 0.9 % (ref 0.0–3.0)
Eosinophils Absolute: 0.1 K/uL (ref 0.0–0.7)
Eosinophils Relative: 1.8 % (ref 0.0–5.0)
HCT: 42 % (ref 39.0–52.0)
Hemoglobin: 14 g/dL (ref 13.0–17.0)
Lymphocytes Relative: 32.3 % (ref 12.0–46.0)
Lymphs Abs: 2.3 K/uL (ref 0.7–4.0)
MCHC: 33.5 g/dL (ref 30.0–36.0)
MCV: 93.2 fl (ref 78.0–100.0)
Monocytes Absolute: 0.6 K/uL (ref 0.1–1.0)
Monocytes Relative: 8.4 % (ref 3.0–12.0)
Neutro Abs: 4 K/uL (ref 1.4–7.7)
Neutrophils Relative %: 56.6 % (ref 43.0–77.0)
Platelets: 246 K/uL (ref 150.0–400.0)
RBC: 4.5 Mil/uL (ref 4.22–5.81)
RDW: 12.9 % (ref 11.5–15.5)
WBC: 7.1 K/uL (ref 4.0–10.5)

## 2024-07-17 ENCOUNTER — Ambulatory Visit: Payer: Self-pay | Admitting: Internal Medicine

## 2024-08-04 ENCOUNTER — Other Ambulatory Visit: Payer: Self-pay | Admitting: Internal Medicine

## 2024-08-04 DIAGNOSIS — I10 Essential (primary) hypertension: Secondary | ICD-10-CM

## 2024-08-05 ENCOUNTER — Telehealth: Payer: Self-pay

## 2024-08-05 NOTE — Telephone Encounter (Signed)
-----   Message from Garnette HERO Dahlstedt sent at 08/05/2024 12:14 PM EDT ----- Please let patient know that the genomic study of his biopsy reveals that it might be worthwhile to move ahead with treatment. I schedule him an appointment at the end of the day for me to discuss things with him. ----- Message ----- From: Trina Flight Sent: 06/25/2024   9:08 AM EDT To: Garnette Shack, MD

## 2024-08-05 NOTE — Telephone Encounter (Signed)
 [facility] requesting a return call.

## 2024-08-07 NOTE — Telephone Encounter (Signed)
 [facility] requesting a return call.

## 2024-08-13 ENCOUNTER — Telehealth: Payer: Self-pay | Admitting: Internal Medicine

## 2024-08-13 NOTE — Telephone Encounter (Signed)
 Patient dropped off document BC[employer] Qoalification Form , to be filled out by provider. Patient requested to send it back via Fax within 7-days. Document is located in providers tray at front office.Please advise at Mobile [phone] (mobile)

## 2024-08-14 NOTE — Telephone Encounter (Signed)
 Form faxed

## 2024-10-14 ENCOUNTER — Ambulatory Visit: Admitting: Internal Medicine

## 2024-12-08 ENCOUNTER — Ambulatory Visit (INDEPENDENT_AMBULATORY_CARE_PROVIDER_SITE_OTHER): Admitting: Internal Medicine

## 2024-12-08 ENCOUNTER — Other Ambulatory Visit: Payer: Self-pay | Admitting: Internal Medicine

## 2024-12-08 ENCOUNTER — Encounter: Payer: Self-pay | Admitting: Internal Medicine

## 2024-12-08 VITALS — BP 128/72 | HR 89 | Temp 98.4°F | Ht 71.0 in | Wt 218.8 lb

## 2024-12-08 DIAGNOSIS — I10 Essential (primary) hypertension: Secondary | ICD-10-CM | POA: Diagnosis not present

## 2024-12-08 DIAGNOSIS — Z7984 Long term (current) use of oral hypoglycemic drugs: Secondary | ICD-10-CM

## 2024-12-08 DIAGNOSIS — M1A9XX0 Chronic gout, unspecified, without tophus (tophi): Secondary | ICD-10-CM

## 2024-12-08 DIAGNOSIS — E7800 Pure hypercholesterolemia, unspecified: Secondary | ICD-10-CM | POA: Diagnosis not present

## 2024-12-08 DIAGNOSIS — C61 Malignant neoplasm of prostate: Secondary | ICD-10-CM

## 2024-12-08 DIAGNOSIS — E1142 Type 2 diabetes mellitus with diabetic polyneuropathy: Secondary | ICD-10-CM

## 2024-12-08 DIAGNOSIS — N5201 Erectile dysfunction due to arterial insufficiency: Secondary | ICD-10-CM

## 2024-12-08 LAB — POCT GLYCOSYLATED HEMOGLOBIN (HGB A1C): Hemoglobin A1C: 8.7 % — AB (ref 4.0–5.6)

## 2024-12-08 MED ORDER — HYDROCODONE-ACETAMINOPHEN 5-325 MG PO TABS: 2.0000 | ORAL_TABLET | Freq: Four times a day (QID) | ORAL | 0 refills | Status: AC | PRN

## 2024-12-08 MED ORDER — EMPAGLIFLOZIN 25 MG PO TABS: 25.0000 mg | ORAL_TABLET | Freq: Every day | ORAL | 3 refills | Status: AC

## 2024-12-08 NOTE — Progress Notes (Signed)
 " [facility] [address] [place] [address] Dept: [phone] Dept Fax: [phone]    Subjective:   Guy Horton [birth year] 12/08/2024  Chief Complaint  Patient presents with   Follow-up    6 months no concerns    HPI: Guy Horton presents today for re-assessment and management of chronic medical conditions.  Discussed the use of AI scribe software for clinical note transcription with the patient, who gave verbal consent to proceed.  History of Present Illness   Guy Horton is a 63 year old male with type 2 diabetes, hypertension, hypercholesterolemia, and prostate cancer who presents for routine follow-up of chronic conditions.  He has type 2 diabetes with diabetic polyneuropathy. His last A1c was 8.6% on June 08, 2024, and it is currently 8.7%. He is taking Jardiance  10 mg once daily and glipizide  XL 10 mg once daily for diabetes management. He has had dietary indiscretions over the holidays, including consumption of cake, pie, and rice, which may have contributed to elevated blood sugar levels.  He has a history of primary hypertension and is currently taking amlodipine  10 mg daily and losartan hydrochlorothiazide 80/25 mg once daily. His medication regimen includes amlodipine  10 mg daily and losartan hydrochlorothiazide 80/25 mg once daily. No headaches, vision changes, CP.   He is managing hypercholesterolemia with atorvastatin  40 mg once daily.  He has a history of elevated PSA and was referred to urology, where a prostate biopsy confirmed prostate cancer. The biopsy was sent for Prolaris testing, but he has not followed up with urology since August 2025 despite their attempts to contact him to discuss further treatment options.   He underwent a colonoscopy in 2018, during which a precancerous polyp was removed.  He mentions experiencing knee pain from a recent flare-up, which he associates with gout. Has  used Norco sparingly in the past which does help with pain. Did colchicine  before , did have diarrhea with this.       Lab Results  Component Value Date   HGBA1C 8.7 (A) 12/08/2024   HGBA1C 8.6 (H) 06/08/2024   HGBA1C 7.6 (A) 01/21/2024   BP Readings from Last 3 Encounters:  12/08/24 128/72  07/14/24 136/82  06/30/24 130/72     The following portions of the patient's history were reviewed and updated as appropriate: past medical history, past surgical history, family history, social history, allergies, medications, and problem list.   Patient Active Problem List   Diagnosis Date Noted   Rash and nonspecific skin eruption 06/30/2024   Type 2 diabetes mellitus with diabetic polyneuropathy, without long-term current use of insulin  (HCC) 06/08/2024   Adenocarcinoma of prostate (HCC) 06/08/2024   Elevated PSA, less than 10 ng/ml 01/28/2024   New onset type 2 diabetes mellitus (HCC) 07/16/2017   Non compliance w medication regimen 11/20/2014   Gout 08/13/2012   HTN (hypertension) 08/13/2012   Erectile dysfunction 08/13/2012   Hypercholesteremia 08/13/2012   Past Medical History:  Diagnosis Date   Arthritis    Diabetes (HCC)    Gout    Hyperlipemia    Hypertension    Past Surgical History:  Procedure Laterality Date   I & D EXTREMITY Left 11/14/2016   Procedure: IRRIGATION AND DEBRIDEMENT LEFT INDEX FINGER;  Surgeon: Prentice Pagan, MD;  Location: [facility] OR;  Service: Orthopedics;  Laterality: Left;   Family History  Problem Relation Age of Onset   Diabetes Mother    Other Father 41  Work accident   Prostate cancer Father    Colon cancer Neg Hx    Colon polyps Neg Hx    Esophageal cancer Neg Hx    Rectal cancer Neg Hx    Stomach cancer Neg Hx    Current Medications[1] Allergies[2]   ROS: A complete ROS was performed with pertinent positives/negatives noted in the HPI. The remainder of the ROS are negative.    Objective:   Today's Vitals   12/08/24 1534  BP:  128/72  Pulse: 89  Temp: 98.4 F (36.9 C)  TempSrc: Temporal  SpO2: 96%  Weight: 218 lb 12.8 oz (99.2 kg)  Height: 5' 11 (1.803 m)    GENERAL: Well-appearing, in NAD. Well nourished.  SKIN: Pink, warm and dry. No rash, lesion, ulceration, or ecchymoses.  NECK: Trachea midline. Full ROM w/o pain or tenderness. No lymphadenopathy. No thyromegaly or palpable masses.  RESPIRATORY: Chest wall symmetrical. Respirations even and non-labored. Breath sounds clear to auscultation bilaterally.  CARDIAC: S1, S2 present, regular rate and rhythm. Peripheral pulses 2+ bilaterally.  EXTREMITIES: Without clubbing, cyanosis, or edema.  NEUROLOGIC:  Steady, even gait.  PSYCH/MENTAL STATUS: Alert, oriented x 3. Cooperative, appropriate mood and affect.   Health Maintenance Due  Topic Date Due   OPHTHALMOLOGY EXAM  Never done    Results for orders placed or performed in visit on 12/08/24  POCT glycosylated hemoglobin (Hb A1C)  Result Value Ref Range   Hemoglobin A1C 8.7 (A) 4.0 - 5.6 %   HbA1c POC (<> result, manual entry)     HbA1c, POC (prediabetic range)     HbA1c, POC (controlled diabetic range)      The 10-year ASCVD risk score (Arnett DK, et al., 2019) is: 25.8%     Assessment & Plan:  Assessment and Plan    Type 2 diabetes mellitus with diabetic polyneuropathy A1c increased to 8.7%, indicating suboptimal glycemic control. Current regimen includes Jardiance  and glipizide  XL. Prefers manual monitoring of diet and blood sugar. - Increased Jardiance  to 25 mg once daily. - Continue glipizide  XL 10 mg once daily. - Continue gabapentin  for neuropathy - Checked kidney function today. - Advised dietary modifications to reduce carbohydrate intake, particularly white rice and sugary foods. - Encouraged regular blood sugar monitoring. - Eye exam referral placed  Primary hypertension Blood pressure well-controlled at 128/72 mmHg on current regimen. - Continue current antihypertensive  regimen. - BMP  Hypercholesterolemia Managed with atorvastatin  40 mg once daily. - Continue atorvastatin  40 mg once daily.  Adenocarcinoma of prostate Prostate cancer diagnosed via biopsy. Has not followed up with urology due to insurance concerns. - Advised contacting urology to discuss treatment options and insurance coverage. Patient verbalized understanding and in agreement with contacting urology for appt.   Chronic gout - Prescribed hydrocodone  for acute gout flare-up - Continue allopurinol  100mg  PO daily    Orders Placed This Encounter  Procedures   Basic metabolic panel with GFR   Ambulatory referral to Ophthalmology    Referral Priority:   Routine    Referral Type:   Consultation    Referral Reason:   Specialty Services Required    Referred to Provider:   Rockney Longs, MD    Number of Visits Requested:   1   POCT glycosylated hemoglobin (Hb A1C)   No images are attached to the encounter or orders placed in the encounter. Meds ordered this encounter  Medications   empagliflozin  (JARDIANCE ) 25 MG TABS tablet    Sig: Take 1 tablet (25  mg total) by mouth daily before breakfast.    Dispense:  90 tablet    Refill:  3    Supervising Provider:   THOMPSON, AARON B [[id number]]   HYDROcodone -acetaminophen  (NORCO/VICODIN) 5-325 MG tablet    Sig: Take 2 tablets by mouth every 6 (six) hours as needed for severe pain (pain score 7-10).    Dispense:  7 tablet    Refill:  0    Supervising Provider:   THOMPSON, AARON B [[id number]]    Return in about 3 months (around 03/08/2025) for Chronic Condition follow up.   Rosina Senters, FNP     [1]  Current Outpatient Medications:    allopurinol  (ZYLOPRIM ) 100 MG tablet, TAKE 1 TABLET BY MOUTH EVERY DAY, Disp: 90 tablet, Rfl: 1   amLODipine  (NORVASC ) 10 MG tablet, TAKE 1 TABLET BY MOUTH EVERY DAY, Disp: 90 tablet, Rfl: 1   atorvastatin  (LIPITOR) 40 MG tablet, Take 1 tablet (40 mg total) by mouth daily., Disp: 90 tablet, Rfl: 3   Blood  Glucose Monitoring Suppl (ONETOUCH VERIO) w/Device KIT, , Disp: , Rfl:    Blood Glucose Monitoring Suppl (TRUE METRIX METER) DEVI, 1 Device by Does not apply route 4 (four) times daily., Disp: 1 Device, Rfl: 0   empagliflozin  (JARDIANCE ) 25 MG TABS tablet, Take 1 tablet (25 mg total) by mouth daily before breakfast., Disp: 90 tablet, Rfl: 3   gabapentin  (NEURONTIN ) 100 MG capsule, Take 1 capsule (100 mg total) by mouth at bedtime., Disp: 90 capsule, Rfl: 1   glipiZIDE  (GLUCOTROL  XL) 10 MG 24 hr tablet, TAKE 1 TABLET (10 MG TOTAL) BY MOUTH DAILY WITH BREAKFAST., Disp: 90 tablet, Rfl: 1   glucose blood (TRUE METRIX BLOOD GLUCOSE TEST) test strip, Use as instructed, Disp: 100 each, Rfl: 2   sildenafil  (VIAGRA ) 100 MG tablet, 1/2 to 1 tablet p.o. as needed for erectile dysfunction, Disp: 10 tablet, Rfl: 2   telmisartan -hydrochlorothiazide (MICARDIS  HCT) 80-25 MG tablet, TAKE 1 TABLET BY MOUTH EVERY DAY, Disp: 90 tablet, Rfl: 1   TRUEPLUS LANCETS 28G MISC, 1 each by Does not apply route 4 (four) times daily., Disp: 100 each, Rfl: 2   HYDROcodone -acetaminophen  (NORCO/VICODIN) 5-325 MG tablet, Take 2 tablets by mouth every 6 (six) hours as needed for severe pain (pain score 7-10)., Disp: 7 tablet, Rfl: 0 [2] No Known Allergies  "

## 2024-12-08 NOTE — Telephone Encounter (Signed)
 Pt was told to have urology fill this prescription at recent OV

## 2024-12-08 NOTE — Patient Instructions (Signed)
 Please call the urologist to set up appointment to discuss prostate cancer treatment options

## 2024-12-09 ENCOUNTER — Other Ambulatory Visit: Payer: Self-pay | Admitting: Internal Medicine

## 2024-12-09 DIAGNOSIS — N5201 Erectile dysfunction due to arterial insufficiency: Secondary | ICD-10-CM

## 2024-12-09 LAB — BASIC METABOLIC PANEL WITH GFR
BUN: 33 mg/dL — ABNORMAL HIGH (ref 6–23)
CO2: 23 meq/L (ref 19–32)
Calcium: 9.4 mg/dL (ref 8.4–10.5)
Chloride: 101 meq/L (ref 96–112)
Creatinine, Ser: 1.52 mg/dL — ABNORMAL HIGH (ref 0.40–1.50)
GFR: 49.21 mL/min — ABNORMAL LOW
Glucose, Bld: 132 mg/dL — ABNORMAL HIGH (ref 70–99)
Potassium: 4.1 meq/L (ref 3.5–5.1)
Sodium: 135 meq/L (ref 135–145)

## 2024-12-12 ENCOUNTER — Other Ambulatory Visit: Payer: Self-pay | Admitting: Internal Medicine

## 2024-12-12 DIAGNOSIS — E119 Type 2 diabetes mellitus without complications: Secondary | ICD-10-CM

## 2024-12-14 ENCOUNTER — Ambulatory Visit: Payer: Self-pay | Admitting: Internal Medicine

## 2024-12-14 DIAGNOSIS — R944 Abnormal results of kidney function studies: Secondary | ICD-10-CM

## 2024-12-15 NOTE — Telephone Encounter (Signed)
 Pt needs to schedule lab visit to repeat blood work

## 2025-01-05 ENCOUNTER — Ambulatory Visit: Admitting: Internal Medicine

## 2025-03-08 ENCOUNTER — Ambulatory Visit: Admitting: Internal Medicine
